# Patient Record
Sex: Female | Born: 1950
Health system: Southern US, Community
[De-identification: ages and names within clinical notes are randomized; demographics above are authoritative.]

## PROBLEM LIST (undated history)

## (undated) DIAGNOSIS — F419 Anxiety disorder, unspecified: Secondary | ICD-10-CM

## (undated) DIAGNOSIS — G4730 Sleep apnea, unspecified: Secondary | ICD-10-CM

## (undated) DIAGNOSIS — F32A Depression, unspecified: Secondary | ICD-10-CM

## (undated) DIAGNOSIS — K219 Gastro-esophageal reflux disease without esophagitis: Secondary | ICD-10-CM

## (undated) DIAGNOSIS — M797 Fibromyalgia: Secondary | ICD-10-CM

## (undated) DIAGNOSIS — M179 Osteoarthritis of knee, unspecified: Secondary | ICD-10-CM

## (undated) DIAGNOSIS — Z973 Presence of spectacles and contact lenses: Secondary | ICD-10-CM

## (undated) DIAGNOSIS — Z87440 Personal history of urinary (tract) infections: Secondary | ICD-10-CM

## (undated) DIAGNOSIS — Z87442 Personal history of urinary calculi: Secondary | ICD-10-CM

## (undated) DIAGNOSIS — R350 Frequency of micturition: Secondary | ICD-10-CM

## (undated) DIAGNOSIS — M1990 Unspecified osteoarthritis, unspecified site: Secondary | ICD-10-CM

## (undated) DIAGNOSIS — H269 Unspecified cataract: Secondary | ICD-10-CM

## (undated) DIAGNOSIS — T7840XA Allergy, unspecified, initial encounter: Secondary | ICD-10-CM

## (undated) DIAGNOSIS — R200 Anesthesia of skin: Secondary | ICD-10-CM

## (undated) DIAGNOSIS — F329 Major depressive disorder, single episode, unspecified: Secondary | ICD-10-CM

## (undated) DIAGNOSIS — D497 Neoplasm of unspecified behavior of endocrine glands and other parts of nervous system: Secondary | ICD-10-CM

## (undated) DIAGNOSIS — R7303 Prediabetes: Secondary | ICD-10-CM

## (undated) DIAGNOSIS — N200 Calculus of kidney: Secondary | ICD-10-CM

## (undated) DIAGNOSIS — I10 Essential (primary) hypertension: Secondary | ICD-10-CM

## (undated) DIAGNOSIS — M1710 Unilateral primary osteoarthritis, unspecified knee: Secondary | ICD-10-CM

## (undated) DIAGNOSIS — E785 Hyperlipidemia, unspecified: Secondary | ICD-10-CM

## (undated) HISTORY — DX: Osteoarthritis of knee, unspecified: M17.9

## (undated) HISTORY — PX: JOINT REPLACEMENT: SHX530

## (undated) HISTORY — PX: LAPAROSCOPIC ENDOMETRIOSIS FULGURATION: SUR769

## (undated) HISTORY — DX: Major depressive disorder, single episode, unspecified: F32.9

## (undated) HISTORY — DX: Depression, unspecified: F32.A

## (undated) HISTORY — PX: KNEE ARTHROSCOPY: SUR90

## (undated) HISTORY — DX: Anxiety disorder, unspecified: F41.9

## (undated) HISTORY — DX: Sleep apnea, unspecified: G47.30

## (undated) HISTORY — DX: Allergy, unspecified, initial encounter: T78.40XA

## (undated) HISTORY — DX: Unilateral primary osteoarthritis, unspecified knee: M17.10

## (undated) HISTORY — DX: Unspecified osteoarthritis, unspecified site: M19.90

## (undated) HISTORY — DX: Essential (primary) hypertension: I10

## (undated) HISTORY — DX: Unspecified cataract: H26.9

## (undated) HISTORY — PX: APPENDECTOMY: SHX54

## (undated) HISTORY — DX: Hyperlipidemia, unspecified: E78.5

## (undated) HISTORY — DX: Neoplasm of unspecified behavior of endocrine glands and other parts of nervous system: D49.7

---

## 1997-10-26 ENCOUNTER — Ambulatory Visit (HOSPITAL_COMMUNITY): Admission: RE | Admit: 1997-10-26 | Discharge: 1997-10-26 | Payer: Self-pay | Admitting: Obstetrics and Gynecology

## 1998-11-01 ENCOUNTER — Other Ambulatory Visit: Admission: RE | Admit: 1998-11-01 | Discharge: 1998-11-01 | Payer: Self-pay | Admitting: Obstetrics and Gynecology

## 1998-11-09 ENCOUNTER — Ambulatory Visit (HOSPITAL_COMMUNITY): Admission: RE | Admit: 1998-11-09 | Discharge: 1998-11-09 | Payer: Self-pay | Admitting: Obstetrics and Gynecology

## 1998-11-09 ENCOUNTER — Encounter: Payer: Self-pay | Admitting: Obstetrics and Gynecology

## 1999-11-11 ENCOUNTER — Ambulatory Visit (HOSPITAL_COMMUNITY): Admission: RE | Admit: 1999-11-11 | Discharge: 1999-11-11 | Payer: Self-pay | Admitting: Obstetrics and Gynecology

## 1999-11-11 ENCOUNTER — Encounter: Payer: Self-pay | Admitting: Obstetrics and Gynecology

## 2000-05-21 ENCOUNTER — Encounter: Payer: Self-pay | Admitting: Family Medicine

## 2000-05-21 ENCOUNTER — Encounter: Admission: RE | Admit: 2000-05-21 | Discharge: 2000-05-21 | Payer: Self-pay | Admitting: Family Medicine

## 2000-05-22 ENCOUNTER — Encounter: Admission: RE | Admit: 2000-05-22 | Discharge: 2000-05-22 | Payer: Self-pay | Admitting: Family Medicine

## 2000-05-29 ENCOUNTER — Encounter: Admission: RE | Admit: 2000-05-29 | Discharge: 2000-05-29 | Payer: Self-pay | Admitting: Family Medicine

## 2000-05-29 ENCOUNTER — Other Ambulatory Visit: Admission: RE | Admit: 2000-05-29 | Discharge: 2000-05-29 | Payer: Self-pay | Admitting: Family Medicine

## 2000-06-26 ENCOUNTER — Encounter: Admission: RE | Admit: 2000-06-26 | Discharge: 2000-06-26 | Payer: Self-pay | Admitting: Family Medicine

## 2000-07-26 ENCOUNTER — Encounter: Admission: RE | Admit: 2000-07-26 | Discharge: 2000-07-26 | Payer: Self-pay | Admitting: Family Medicine

## 2000-07-28 ENCOUNTER — Encounter: Payer: Self-pay | Admitting: Family Medicine

## 2000-07-28 ENCOUNTER — Encounter: Admission: RE | Admit: 2000-07-28 | Discharge: 2000-07-28 | Payer: Self-pay | Admitting: Family Medicine

## 2000-09-11 ENCOUNTER — Encounter: Admission: RE | Admit: 2000-09-11 | Discharge: 2000-09-11 | Payer: Self-pay | Admitting: Family Medicine

## 2000-10-25 ENCOUNTER — Encounter: Admission: RE | Admit: 2000-10-25 | Discharge: 2000-10-25 | Payer: Self-pay | Admitting: Sports Medicine

## 2000-12-20 ENCOUNTER — Encounter: Admission: RE | Admit: 2000-12-20 | Discharge: 2000-12-20 | Payer: Self-pay | Admitting: Family Medicine

## 2001-01-23 ENCOUNTER — Encounter: Payer: Self-pay | Admitting: Family Medicine

## 2001-01-23 ENCOUNTER — Ambulatory Visit (HOSPITAL_COMMUNITY): Admission: RE | Admit: 2001-01-23 | Discharge: 2001-01-23 | Payer: Self-pay | Admitting: Family Medicine

## 2001-01-31 ENCOUNTER — Encounter: Admission: RE | Admit: 2001-01-31 | Discharge: 2001-01-31 | Payer: Self-pay | Admitting: Family Medicine

## 2001-03-15 ENCOUNTER — Encounter: Admission: RE | Admit: 2001-03-15 | Discharge: 2001-03-15 | Payer: Self-pay | Admitting: Family Medicine

## 2001-04-16 ENCOUNTER — Encounter: Admission: RE | Admit: 2001-04-16 | Discharge: 2001-04-16 | Payer: Self-pay | Admitting: Family Medicine

## 2001-05-14 ENCOUNTER — Encounter: Admission: RE | Admit: 2001-05-14 | Discharge: 2001-05-14 | Payer: Self-pay | Admitting: Sports Medicine

## 2001-05-29 ENCOUNTER — Encounter: Admission: RE | Admit: 2001-05-29 | Discharge: 2001-05-29 | Payer: Self-pay | Admitting: Family Medicine

## 2001-06-05 ENCOUNTER — Encounter: Admission: RE | Admit: 2001-06-05 | Discharge: 2001-06-05 | Payer: Self-pay | Admitting: Family Medicine

## 2001-06-27 ENCOUNTER — Encounter: Admission: RE | Admit: 2001-06-27 | Discharge: 2001-06-27 | Payer: Self-pay | Admitting: Family Medicine

## 2001-07-10 ENCOUNTER — Encounter: Admission: RE | Admit: 2001-07-10 | Discharge: 2001-07-10 | Payer: Self-pay | Admitting: Family Medicine

## 2001-07-29 ENCOUNTER — Encounter: Admission: RE | Admit: 2001-07-29 | Discharge: 2001-07-29 | Payer: Self-pay | Admitting: Family Medicine

## 2001-08-23 ENCOUNTER — Encounter: Admission: RE | Admit: 2001-08-23 | Discharge: 2001-08-23 | Payer: Self-pay | Admitting: Family Medicine

## 2001-10-16 ENCOUNTER — Encounter: Admission: RE | Admit: 2001-10-16 | Discharge: 2001-10-16 | Payer: Self-pay | Admitting: Family Medicine

## 2001-10-31 ENCOUNTER — Encounter: Admission: RE | Admit: 2001-10-31 | Discharge: 2001-10-31 | Payer: Self-pay | Admitting: Family Medicine

## 2002-01-09 ENCOUNTER — Encounter: Admission: RE | Admit: 2002-01-09 | Discharge: 2002-01-09 | Payer: Self-pay | Admitting: Family Medicine

## 2002-01-28 ENCOUNTER — Encounter: Payer: Self-pay | Admitting: Family Medicine

## 2002-01-28 ENCOUNTER — Ambulatory Visit (HOSPITAL_COMMUNITY): Admission: RE | Admit: 2002-01-28 | Discharge: 2002-01-28 | Payer: Self-pay | Admitting: Family Medicine

## 2002-01-30 ENCOUNTER — Encounter: Payer: Self-pay | Admitting: Family Medicine

## 2002-01-30 ENCOUNTER — Encounter: Admission: RE | Admit: 2002-01-30 | Discharge: 2002-01-30 | Payer: Self-pay | Admitting: Family Medicine

## 2002-04-24 ENCOUNTER — Encounter: Admission: RE | Admit: 2002-04-24 | Discharge: 2002-04-24 | Payer: Self-pay | Admitting: Family Medicine

## 2002-07-18 ENCOUNTER — Encounter: Admission: RE | Admit: 2002-07-18 | Discharge: 2002-07-18 | Payer: Self-pay | Admitting: Family Medicine

## 2002-07-24 ENCOUNTER — Encounter: Admission: RE | Admit: 2002-07-24 | Discharge: 2002-07-24 | Payer: Self-pay | Admitting: Family Medicine

## 2002-09-04 ENCOUNTER — Encounter: Admission: RE | Admit: 2002-09-04 | Discharge: 2002-09-04 | Payer: Self-pay | Admitting: Family Medicine

## 2002-10-17 ENCOUNTER — Ambulatory Visit (HOSPITAL_COMMUNITY): Admission: RE | Admit: 2002-10-17 | Discharge: 2002-10-17 | Payer: Self-pay | Admitting: Family Medicine

## 2002-10-17 ENCOUNTER — Encounter: Admission: RE | Admit: 2002-10-17 | Discharge: 2002-10-17 | Payer: Self-pay | Admitting: Family Medicine

## 2002-11-13 ENCOUNTER — Encounter: Admission: RE | Admit: 2002-11-13 | Discharge: 2002-11-13 | Payer: Self-pay | Admitting: Family Medicine

## 2002-11-18 ENCOUNTER — Encounter: Admission: RE | Admit: 2002-11-18 | Discharge: 2002-11-18 | Payer: Self-pay | Admitting: Family Medicine

## 2002-12-05 ENCOUNTER — Encounter: Admission: RE | Admit: 2002-12-05 | Discharge: 2002-12-05 | Payer: Self-pay | Admitting: Family Medicine

## 2003-04-01 ENCOUNTER — Ambulatory Visit (HOSPITAL_COMMUNITY): Admission: RE | Admit: 2003-04-01 | Discharge: 2003-04-01 | Payer: Self-pay

## 2003-04-03 ENCOUNTER — Encounter: Admission: RE | Admit: 2003-04-03 | Discharge: 2003-04-03 | Payer: Self-pay | Admitting: Sports Medicine

## 2003-04-03 ENCOUNTER — Encounter: Payer: Self-pay | Admitting: Sports Medicine

## 2003-04-08 ENCOUNTER — Encounter: Admission: RE | Admit: 2003-04-08 | Discharge: 2003-04-08 | Payer: Self-pay | Admitting: Family Medicine

## 2003-05-06 ENCOUNTER — Encounter: Admission: RE | Admit: 2003-05-06 | Discharge: 2003-07-15 | Payer: Self-pay | Admitting: Family Medicine

## 2003-05-28 ENCOUNTER — Encounter: Admission: RE | Admit: 2003-05-28 | Discharge: 2003-05-28 | Payer: Self-pay | Admitting: Sports Medicine

## 2003-07-08 ENCOUNTER — Encounter: Admission: RE | Admit: 2003-07-08 | Discharge: 2003-07-08 | Payer: Self-pay | Admitting: Family Medicine

## 2003-09-29 ENCOUNTER — Encounter: Admission: RE | Admit: 2003-09-29 | Discharge: 2003-09-29 | Payer: Self-pay | Admitting: Sports Medicine

## 2004-01-01 ENCOUNTER — Encounter: Admission: RE | Admit: 2004-01-01 | Discharge: 2004-01-01 | Payer: Self-pay | Admitting: Sports Medicine

## 2004-04-05 ENCOUNTER — Encounter: Admission: RE | Admit: 2004-04-05 | Discharge: 2004-04-05 | Payer: Self-pay | Admitting: Sports Medicine

## 2004-04-11 ENCOUNTER — Encounter: Admission: RE | Admit: 2004-04-11 | Discharge: 2004-04-11 | Payer: Self-pay | Admitting: Sports Medicine

## 2004-05-13 ENCOUNTER — Ambulatory Visit: Payer: Self-pay | Admitting: Family Medicine

## 2004-08-12 ENCOUNTER — Ambulatory Visit: Payer: Self-pay | Admitting: Family Medicine

## 2004-09-12 ENCOUNTER — Ambulatory Visit: Payer: Self-pay | Admitting: Family Medicine

## 2004-10-11 ENCOUNTER — Encounter: Admission: RE | Admit: 2004-10-11 | Discharge: 2004-10-11 | Payer: Self-pay | Admitting: Sports Medicine

## 2004-10-11 ENCOUNTER — Ambulatory Visit: Payer: Self-pay | Admitting: Family Medicine

## 2005-04-06 ENCOUNTER — Encounter: Admission: RE | Admit: 2005-04-06 | Discharge: 2005-04-06 | Payer: Self-pay | Admitting: Sports Medicine

## 2005-04-14 ENCOUNTER — Ambulatory Visit: Payer: Self-pay | Admitting: Family Medicine

## 2005-04-20 ENCOUNTER — Ambulatory Visit: Payer: Self-pay | Admitting: Family Medicine

## 2005-05-10 ENCOUNTER — Ambulatory Visit: Payer: Self-pay | Admitting: Family Medicine

## 2005-06-29 ENCOUNTER — Ambulatory Visit: Payer: Self-pay | Admitting: Family Medicine

## 2005-07-25 ENCOUNTER — Ambulatory Visit: Payer: Self-pay | Admitting: Family Medicine

## 2005-09-27 ENCOUNTER — Ambulatory Visit: Payer: Self-pay | Admitting: Family Medicine

## 2006-02-23 ENCOUNTER — Ambulatory Visit: Payer: Self-pay | Admitting: Family Medicine

## 2006-03-14 ENCOUNTER — Encounter (INDEPENDENT_AMBULATORY_CARE_PROVIDER_SITE_OTHER): Payer: Self-pay | Admitting: *Deleted

## 2006-03-16 ENCOUNTER — Ambulatory Visit: Payer: Self-pay | Admitting: Family Medicine

## 2006-03-23 ENCOUNTER — Ambulatory Visit: Payer: Self-pay | Admitting: Family Medicine

## 2006-04-23 ENCOUNTER — Encounter: Admission: RE | Admit: 2006-04-23 | Discharge: 2006-04-23 | Payer: Self-pay | Admitting: Sports Medicine

## 2006-08-01 ENCOUNTER — Ambulatory Visit: Payer: Self-pay | Admitting: Family Medicine

## 2006-08-16 ENCOUNTER — Ambulatory Visit: Payer: Self-pay | Admitting: Family Medicine

## 2006-10-11 DIAGNOSIS — K219 Gastro-esophageal reflux disease without esophagitis: Secondary | ICD-10-CM | POA: Insufficient documentation

## 2006-10-11 DIAGNOSIS — F339 Major depressive disorder, recurrent, unspecified: Secondary | ICD-10-CM | POA: Insufficient documentation

## 2006-10-11 DIAGNOSIS — N951 Menopausal and female climacteric states: Secondary | ICD-10-CM | POA: Insufficient documentation

## 2006-10-11 DIAGNOSIS — E7800 Pure hypercholesterolemia, unspecified: Secondary | ICD-10-CM | POA: Insufficient documentation

## 2006-10-11 DIAGNOSIS — IMO0001 Reserved for inherently not codable concepts without codable children: Secondary | ICD-10-CM | POA: Insufficient documentation

## 2006-10-11 DIAGNOSIS — I10 Essential (primary) hypertension: Secondary | ICD-10-CM | POA: Insufficient documentation

## 2006-10-12 ENCOUNTER — Encounter (INDEPENDENT_AMBULATORY_CARE_PROVIDER_SITE_OTHER): Payer: Self-pay | Admitting: *Deleted

## 2006-11-27 ENCOUNTER — Encounter (INDEPENDENT_AMBULATORY_CARE_PROVIDER_SITE_OTHER): Payer: Self-pay | Admitting: Family Medicine

## 2006-11-27 ENCOUNTER — Ambulatory Visit: Payer: Self-pay | Admitting: Family Medicine

## 2006-11-27 LAB — CONVERTED CEMR LAB
BUN: 23 mg/dL (ref 6–23)
Calcium: 10.5 mg/dL (ref 8.4–10.5)
Chloride: 104 meq/L (ref 96–112)
Creatinine, Ser: 0.75 mg/dL (ref 0.40–1.20)
Progesterone: 0.4 ng/mL

## 2006-11-28 ENCOUNTER — Telehealth (INDEPENDENT_AMBULATORY_CARE_PROVIDER_SITE_OTHER): Payer: Self-pay | Admitting: Family Medicine

## 2006-12-13 ENCOUNTER — Telehealth (INDEPENDENT_AMBULATORY_CARE_PROVIDER_SITE_OTHER): Payer: Self-pay | Admitting: *Deleted

## 2006-12-18 ENCOUNTER — Ambulatory Visit (HOSPITAL_COMMUNITY): Admission: RE | Admit: 2006-12-18 | Discharge: 2006-12-18 | Payer: Self-pay | Admitting: Family Medicine

## 2006-12-19 ENCOUNTER — Encounter: Admission: RE | Admit: 2006-12-19 | Discharge: 2006-12-19 | Payer: Self-pay | Admitting: Family Medicine

## 2007-01-18 ENCOUNTER — Encounter (INDEPENDENT_AMBULATORY_CARE_PROVIDER_SITE_OTHER): Payer: Self-pay | Admitting: *Deleted

## 2007-02-01 ENCOUNTER — Ambulatory Visit: Payer: Self-pay | Admitting: Family Medicine

## 2007-03-28 ENCOUNTER — Encounter (INDEPENDENT_AMBULATORY_CARE_PROVIDER_SITE_OTHER): Payer: Self-pay | Admitting: Family Medicine

## 2007-04-02 ENCOUNTER — Encounter (INDEPENDENT_AMBULATORY_CARE_PROVIDER_SITE_OTHER): Payer: Self-pay | Admitting: Family Medicine

## 2007-04-04 ENCOUNTER — Telehealth (INDEPENDENT_AMBULATORY_CARE_PROVIDER_SITE_OTHER): Payer: Self-pay | Admitting: Family Medicine

## 2007-04-18 ENCOUNTER — Encounter (INDEPENDENT_AMBULATORY_CARE_PROVIDER_SITE_OTHER): Payer: Self-pay | Admitting: Family Medicine

## 2007-04-18 ENCOUNTER — Ambulatory Visit: Payer: Self-pay | Admitting: Sports Medicine

## 2007-04-25 ENCOUNTER — Encounter (INDEPENDENT_AMBULATORY_CARE_PROVIDER_SITE_OTHER): Payer: Self-pay | Admitting: Family Medicine

## 2007-04-25 ENCOUNTER — Encounter: Admission: RE | Admit: 2007-04-25 | Discharge: 2007-04-25 | Payer: Self-pay | Admitting: Family Medicine

## 2007-04-25 LAB — CONVERTED CEMR LAB
ALT: 15 units/L (ref 0–35)
AST: 18 units/L (ref 0–37)
Chloride: 102 meq/L (ref 96–112)
Creatinine, Ser: 0.84 mg/dL (ref 0.40–1.20)
HCT: 43.3 % (ref 36.0–46.0)
MCV: 92.9 fL (ref 78.0–100.0)
Platelets: 287 10*3/uL (ref 150–400)
RDW: 13.4 % (ref 11.5–14.0)
Total Bilirubin: 0.7 mg/dL (ref 0.3–1.2)
Total CHOL/HDL Ratio: 3.6
VLDL: 31 mg/dL (ref 0–40)

## 2007-05-28 ENCOUNTER — Ambulatory Visit: Payer: Self-pay | Admitting: Family Medicine

## 2007-06-09 ENCOUNTER — Encounter: Admission: RE | Admit: 2007-06-09 | Discharge: 2007-06-09 | Payer: Self-pay

## 2007-07-04 ENCOUNTER — Ambulatory Visit: Payer: Self-pay | Admitting: Family Medicine

## 2007-07-04 DIAGNOSIS — M159 Polyosteoarthritis, unspecified: Secondary | ICD-10-CM | POA: Insufficient documentation

## 2007-07-04 DIAGNOSIS — G5600 Carpal tunnel syndrome, unspecified upper limb: Secondary | ICD-10-CM | POA: Insufficient documentation

## 2007-08-01 ENCOUNTER — Encounter: Admission: RE | Admit: 2007-08-01 | Discharge: 2007-08-01 | Payer: Self-pay

## 2007-08-12 ENCOUNTER — Ambulatory Visit: Payer: Self-pay | Admitting: Family Medicine

## 2007-08-12 ENCOUNTER — Encounter (INDEPENDENT_AMBULATORY_CARE_PROVIDER_SITE_OTHER): Payer: Self-pay | Admitting: Family Medicine

## 2007-08-12 DIAGNOSIS — G2581 Restless legs syndrome: Secondary | ICD-10-CM | POA: Insufficient documentation

## 2007-08-29 ENCOUNTER — Encounter (INDEPENDENT_AMBULATORY_CARE_PROVIDER_SITE_OTHER): Payer: Self-pay | Admitting: Family Medicine

## 2007-09-02 ENCOUNTER — Encounter: Admission: RE | Admit: 2007-09-02 | Discharge: 2007-09-02 | Payer: Self-pay | Admitting: General Surgery

## 2007-09-20 ENCOUNTER — Encounter (INDEPENDENT_AMBULATORY_CARE_PROVIDER_SITE_OTHER): Payer: Self-pay | Admitting: Family Medicine

## 2007-10-15 ENCOUNTER — Telehealth (INDEPENDENT_AMBULATORY_CARE_PROVIDER_SITE_OTHER): Payer: Self-pay | Admitting: Family Medicine

## 2007-11-07 ENCOUNTER — Telehealth: Payer: Self-pay | Admitting: *Deleted

## 2007-12-05 ENCOUNTER — Ambulatory Visit: Payer: Self-pay | Admitting: Family Medicine

## 2007-12-05 ENCOUNTER — Telehealth: Payer: Self-pay | Admitting: *Deleted

## 2007-12-05 LAB — CONVERTED CEMR LAB: Rapid Strep: NEGATIVE

## 2007-12-06 ENCOUNTER — Emergency Department (HOSPITAL_COMMUNITY): Admission: EM | Admit: 2007-12-06 | Discharge: 2007-12-06 | Payer: Self-pay | Admitting: Emergency Medicine

## 2007-12-13 ENCOUNTER — Ambulatory Visit: Payer: Self-pay | Admitting: Family Medicine

## 2007-12-13 ENCOUNTER — Encounter (INDEPENDENT_AMBULATORY_CARE_PROVIDER_SITE_OTHER): Payer: Self-pay | Admitting: Family Medicine

## 2007-12-13 DIAGNOSIS — D351 Benign neoplasm of parathyroid gland: Secondary | ICD-10-CM | POA: Insufficient documentation

## 2007-12-17 LAB — CONVERTED CEMR LAB
ALT: 27 units/L (ref 0–35)
Alkaline Phosphatase: 115 units/L (ref 39–117)
Sodium: 138 meq/L (ref 135–145)
Total Bilirubin: 0.4 mg/dL (ref 0.3–1.2)
Total Protein: 7.1 g/dL (ref 6.0–8.3)

## 2007-12-23 ENCOUNTER — Encounter (INDEPENDENT_AMBULATORY_CARE_PROVIDER_SITE_OTHER): Payer: Self-pay | Admitting: Family Medicine

## 2008-04-27 ENCOUNTER — Encounter: Admission: RE | Admit: 2008-04-27 | Discharge: 2008-04-27 | Payer: Self-pay | Admitting: Endocrinology

## 2008-04-30 ENCOUNTER — Encounter (INDEPENDENT_AMBULATORY_CARE_PROVIDER_SITE_OTHER): Payer: Self-pay | Admitting: Family Medicine

## 2009-05-27 ENCOUNTER — Encounter: Admission: RE | Admit: 2009-05-27 | Discharge: 2009-05-27 | Payer: Self-pay | Admitting: Internal Medicine

## 2010-06-03 ENCOUNTER — Encounter: Admission: RE | Admit: 2010-06-03 | Discharge: 2010-06-03 | Payer: Self-pay | Admitting: Internal Medicine

## 2011-04-24 ENCOUNTER — Encounter: Payer: Self-pay | Admitting: Internal Medicine

## 2011-06-29 ENCOUNTER — Other Ambulatory Visit: Payer: Self-pay | Admitting: Pulmonary Disease

## 2011-06-29 ENCOUNTER — Other Ambulatory Visit: Payer: Self-pay | Admitting: Internal Medicine

## 2011-06-29 DIAGNOSIS — Z1231 Encounter for screening mammogram for malignant neoplasm of breast: Secondary | ICD-10-CM

## 2011-07-26 ENCOUNTER — Ambulatory Visit
Admission: RE | Admit: 2011-07-26 | Discharge: 2011-07-26 | Disposition: A | Discharge status: Home / Self Care | Payer: Medicare Other | Source: Ambulatory Visit | Attending: Internal Medicine | Admitting: Internal Medicine

## 2011-07-26 DIAGNOSIS — Z1231 Encounter for screening mammogram for malignant neoplasm of breast: Secondary | ICD-10-CM

## 2011-07-28 ENCOUNTER — Encounter: Payer: Self-pay | Admitting: Internal Medicine

## 2011-08-16 DIAGNOSIS — Z01419 Encounter for gynecological examination (general) (routine) without abnormal findings: Secondary | ICD-10-CM | POA: Diagnosis not present

## 2011-08-16 DIAGNOSIS — Z0000 Encounter for general adult medical examination without abnormal findings: Secondary | ICD-10-CM | POA: Diagnosis not present

## 2011-08-16 DIAGNOSIS — Z124 Encounter for screening for malignant neoplasm of cervix: Secondary | ICD-10-CM | POA: Diagnosis not present

## 2011-08-18 ENCOUNTER — Ambulatory Visit (AMBULATORY_SURGERY_CENTER): Payer: Medicare Other | Admitting: *Deleted

## 2011-08-18 VITALS — Ht 68.0 in | Wt 182.6 lb

## 2011-08-18 DIAGNOSIS — Z1211 Encounter for screening for malignant neoplasm of colon: Secondary | ICD-10-CM

## 2011-08-18 MED ORDER — PEG-KCL-NACL-NASULF-NA ASC-C 100 G PO SOLR: ORAL | Status: DC

## 2011-09-01 ENCOUNTER — Ambulatory Visit (AMBULATORY_SURGERY_CENTER): Payer: Medicare Other | Admitting: Internal Medicine

## 2011-09-01 ENCOUNTER — Encounter: Payer: Self-pay | Admitting: Internal Medicine

## 2011-09-01 VITALS — BP 135/93 | HR 79 | Temp 97.1°F | Resp 22 | Ht 68.0 in | Wt 182.0 lb

## 2011-09-01 DIAGNOSIS — D128 Benign neoplasm of rectum: Secondary | ICD-10-CM

## 2011-09-01 DIAGNOSIS — Z1211 Encounter for screening for malignant neoplasm of colon: Secondary | ICD-10-CM

## 2011-09-01 DIAGNOSIS — D126 Benign neoplasm of colon, unspecified: Secondary | ICD-10-CM | POA: Diagnosis not present

## 2011-09-01 DIAGNOSIS — D129 Benign neoplasm of anus and anal canal: Secondary | ICD-10-CM | POA: Diagnosis not present

## 2011-09-01 MED ORDER — SODIUM CHLORIDE 0.9 % IV SOLN: 500.0000 mL | INTRAVENOUS | Status: DC

## 2011-09-01 NOTE — Patient Instructions (Signed)
FOLLOW INSTRUCTIONS ON THE GREEN AND BLUE INSTRUCTION SHEETS.  CONTINUE YOUR MEDICATIONS.  HIGH FIBER DIET.  AWAIT PATHOLOGY RESULTS. 

## 2011-09-01 NOTE — Progress Notes (Signed)
Pressure applied to the abdomen to reach cecum,

## 2011-09-01 NOTE — Op Note (Signed)
 [facility] [address] [place], [place]  [address]  COLONOSCOPY PROCEDURE REPORT  PATIENT:  Alexandra Gibson, Alexandra Gibson  MR#:  [id number] BIRTHDATE:  03-Jan-1951, 60 yrs. old  GENDER:  female ENDOSCOPIST:  Hedwig Morton. Juanda Chance, MD REF. BY:  Thayer Headings, M.D. PROCEDURE DATE:  09/01/2011 PROCEDURE:  Colonoscopy with biopsy and snare polypectomy ASA CLASS:  Class I INDICATIONS:  Routine Risk Screening last colon 2002 was normal MEDICATIONS:   These medications were titrated to patient response per physician's verbal order, Versed 18 mg, Fentanyl 175 mcg  DESCRIPTION OF PROCEDURE:   After the risks and benefits and of the procedure were explained, informed consent was obtained. Digital rectal exam was performed and revealed no rectal masses. The LB PCF-[id number] endoscope was introduced through the anus and advanced to the cecum, which was identified by both the appendix and ileocecal valve.  The quality of the prep was good, using MoviPrep.  The instrument was then slowly withdrawn as the colon was fully examined. <<PROCEDUREIMAGES>>  FINDINGS:  Three polyps were found. 3-5 mm sessile polyp at 5-10 cm, 15 mm sessile polyp at 20cm The polyps were removed using cold biopsy forceps. Polyp was snared, then cauterized with monopolar cautery. Retrieval was successful (see image5, image6, and image8). snare polyp  Mild diverticulosis was found in the sigmoid colon (see image1).  This was otherwise a normal examination of the colon (see image2, image3, image4, and image7).   Retroflexed views in the rectum revealed no abnormalities.    The scope was then withdrawn from the patient and the procedure completed.  COMPLICATIONS:  None ENDOSCOPIC IMPRESSION: 1) Three polyps 2) Mild diverticulosis in the sigmoid colon 3) Otherwise normal examination RECOMMENDATIONS: 1) Await pathology results 2) High fiber diet.  REPEAT EXAM:  In 5 year(s) for.  ______________________________ Hedwig Morton.  Juanda Chance, MD  CC:  n. eSIGNED:   Hedwig Morton. Brodie at 09/01/2011 02:43 PM  Alexandra Gibson, [id number]

## 2011-09-01 NOTE — Progress Notes (Signed)
Patient did not experience any of the following events: a burn prior to discharge; a fall within the facility; wrong site/side/patient/procedure/implant event; or a hospital transfer or hospital admission upon discharge from the facility. (G8907) Patient did not have preoperative order for IV antibiotic SSI prophylaxis. (G8918)  

## 2011-09-04 ENCOUNTER — Telehealth: Payer: Self-pay | Admitting: *Deleted

## 2011-09-04 NOTE — Telephone Encounter (Signed)

## 2011-09-05 ENCOUNTER — Encounter: Payer: Self-pay | Admitting: *Deleted

## 2011-09-05 ENCOUNTER — Encounter: Payer: Self-pay | Admitting: Internal Medicine

## 2011-09-07 ENCOUNTER — Encounter: Payer: Self-pay | Admitting: *Deleted

## 2011-11-20 DIAGNOSIS — J019 Acute sinusitis, unspecified: Secondary | ICD-10-CM | POA: Diagnosis not present

## 2011-11-20 DIAGNOSIS — M1710 Unilateral primary osteoarthritis, unspecified knee: Secondary | ICD-10-CM | POA: Diagnosis not present

## 2011-11-20 DIAGNOSIS — IMO0002 Reserved for concepts with insufficient information to code with codable children: Secondary | ICD-10-CM | POA: Diagnosis not present

## 2011-11-20 DIAGNOSIS — R059 Cough, unspecified: Secondary | ICD-10-CM | POA: Diagnosis not present

## 2011-11-20 DIAGNOSIS — R05 Cough: Secondary | ICD-10-CM | POA: Diagnosis not present

## 2012-01-12 DIAGNOSIS — IMO0002 Reserved for concepts with insufficient information to code with codable children: Secondary | ICD-10-CM | POA: Diagnosis not present

## 2012-01-12 DIAGNOSIS — M1710 Unilateral primary osteoarthritis, unspecified knee: Secondary | ICD-10-CM | POA: Diagnosis not present

## 2012-01-24 DIAGNOSIS — E559 Vitamin D deficiency, unspecified: Secondary | ICD-10-CM | POA: Diagnosis not present

## 2012-01-24 DIAGNOSIS — I10 Essential (primary) hypertension: Secondary | ICD-10-CM | POA: Diagnosis not present

## 2012-01-31 DIAGNOSIS — K219 Gastro-esophageal reflux disease without esophagitis: Secondary | ICD-10-CM | POA: Diagnosis not present

## 2012-01-31 DIAGNOSIS — E785 Hyperlipidemia, unspecified: Secondary | ICD-10-CM | POA: Diagnosis not present

## 2012-01-31 DIAGNOSIS — J309 Allergic rhinitis, unspecified: Secondary | ICD-10-CM | POA: Diagnosis not present

## 2012-01-31 DIAGNOSIS — I10 Essential (primary) hypertension: Secondary | ICD-10-CM | POA: Diagnosis not present

## 2012-02-22 DIAGNOSIS — M1710 Unilateral primary osteoarthritis, unspecified knee: Secondary | ICD-10-CM | POA: Diagnosis not present

## 2012-02-22 DIAGNOSIS — IMO0002 Reserved for concepts with insufficient information to code with codable children: Secondary | ICD-10-CM | POA: Diagnosis not present

## 2012-02-29 DIAGNOSIS — IMO0002 Reserved for concepts with insufficient information to code with codable children: Secondary | ICD-10-CM | POA: Diagnosis not present

## 2012-02-29 DIAGNOSIS — M1710 Unilateral primary osteoarthritis, unspecified knee: Secondary | ICD-10-CM | POA: Diagnosis not present

## 2012-03-07 DIAGNOSIS — IMO0002 Reserved for concepts with insufficient information to code with codable children: Secondary | ICD-10-CM | POA: Diagnosis not present

## 2012-03-07 DIAGNOSIS — M1710 Unilateral primary osteoarthritis, unspecified knee: Secondary | ICD-10-CM | POA: Diagnosis not present

## 2012-07-25 ENCOUNTER — Other Ambulatory Visit: Payer: Self-pay | Admitting: Internal Medicine

## 2012-07-25 DIAGNOSIS — E785 Hyperlipidemia, unspecified: Secondary | ICD-10-CM | POA: Diagnosis not present

## 2012-07-25 DIAGNOSIS — E559 Vitamin D deficiency, unspecified: Secondary | ICD-10-CM | POA: Diagnosis not present

## 2012-07-25 DIAGNOSIS — I10 Essential (primary) hypertension: Secondary | ICD-10-CM | POA: Diagnosis not present

## 2012-07-25 DIAGNOSIS — Z1331 Encounter for screening for depression: Secondary | ICD-10-CM | POA: Diagnosis not present

## 2012-07-25 DIAGNOSIS — Z0000 Encounter for general adult medical examination without abnormal findings: Secondary | ICD-10-CM | POA: Diagnosis not present

## 2012-07-25 DIAGNOSIS — E663 Overweight: Secondary | ICD-10-CM | POA: Diagnosis not present

## 2012-07-25 DIAGNOSIS — Z1231 Encounter for screening mammogram for malignant neoplasm of breast: Secondary | ICD-10-CM

## 2012-08-01 DIAGNOSIS — H43819 Vitreous degeneration, unspecified eye: Secondary | ICD-10-CM | POA: Diagnosis not present

## 2012-08-01 DIAGNOSIS — E559 Vitamin D deficiency, unspecified: Secondary | ICD-10-CM | POA: Diagnosis not present

## 2012-08-01 DIAGNOSIS — Z23 Encounter for immunization: Secondary | ICD-10-CM | POA: Diagnosis not present

## 2012-08-01 DIAGNOSIS — E785 Hyperlipidemia, unspecified: Secondary | ICD-10-CM | POA: Diagnosis not present

## 2012-08-01 DIAGNOSIS — I10 Essential (primary) hypertension: Secondary | ICD-10-CM | POA: Diagnosis not present

## 2012-08-01 DIAGNOSIS — F411 Generalized anxiety disorder: Secondary | ICD-10-CM | POA: Diagnosis not present

## 2012-08-09 DIAGNOSIS — H52209 Unspecified astigmatism, unspecified eye: Secondary | ICD-10-CM | POA: Diagnosis not present

## 2012-08-09 DIAGNOSIS — H5200 Hypermetropia, unspecified eye: Secondary | ICD-10-CM | POA: Diagnosis not present

## 2012-08-09 DIAGNOSIS — H524 Presbyopia: Secondary | ICD-10-CM | POA: Diagnosis not present

## 2012-08-09 DIAGNOSIS — H43819 Vitreous degeneration, unspecified eye: Secondary | ICD-10-CM | POA: Diagnosis not present

## 2012-09-03 ENCOUNTER — Ambulatory Visit
Admission: RE | Admit: 2012-09-03 | Discharge: 2012-09-03 | Disposition: A | Discharge status: Home / Self Care | Payer: Medicare Other | Source: Ambulatory Visit | Attending: Internal Medicine | Admitting: Internal Medicine

## 2012-09-03 DIAGNOSIS — Z1231 Encounter for screening mammogram for malignant neoplasm of breast: Secondary | ICD-10-CM

## 2012-09-06 DIAGNOSIS — Z01419 Encounter for gynecological examination (general) (routine) without abnormal findings: Secondary | ICD-10-CM | POA: Diagnosis not present

## 2013-01-30 DIAGNOSIS — E210 Primary hyperparathyroidism: Secondary | ICD-10-CM | POA: Diagnosis not present

## 2013-01-30 DIAGNOSIS — E785 Hyperlipidemia, unspecified: Secondary | ICD-10-CM | POA: Diagnosis not present

## 2013-01-30 DIAGNOSIS — I10 Essential (primary) hypertension: Secondary | ICD-10-CM | POA: Diagnosis not present

## 2013-01-30 DIAGNOSIS — E559 Vitamin D deficiency, unspecified: Secondary | ICD-10-CM | POA: Diagnosis not present

## 2013-02-11 DIAGNOSIS — K219 Gastro-esophageal reflux disease without esophagitis: Secondary | ICD-10-CM | POA: Diagnosis not present

## 2013-02-11 DIAGNOSIS — E210 Primary hyperparathyroidism: Secondary | ICD-10-CM | POA: Diagnosis not present

## 2013-02-11 DIAGNOSIS — E785 Hyperlipidemia, unspecified: Secondary | ICD-10-CM | POA: Diagnosis not present

## 2013-02-11 DIAGNOSIS — I10 Essential (primary) hypertension: Secondary | ICD-10-CM | POA: Diagnosis not present

## 2013-08-20 ENCOUNTER — Other Ambulatory Visit: Payer: Self-pay

## 2013-08-20 DIAGNOSIS — Z1231 Encounter for screening mammogram for malignant neoplasm of breast: Secondary | ICD-10-CM

## 2013-08-26 DIAGNOSIS — E785 Hyperlipidemia, unspecified: Secondary | ICD-10-CM | POA: Diagnosis not present

## 2013-08-26 DIAGNOSIS — E559 Vitamin D deficiency, unspecified: Secondary | ICD-10-CM | POA: Diagnosis not present

## 2013-08-26 DIAGNOSIS — K219 Gastro-esophageal reflux disease without esophagitis: Secondary | ICD-10-CM | POA: Diagnosis not present

## 2013-08-26 DIAGNOSIS — E210 Primary hyperparathyroidism: Secondary | ICD-10-CM | POA: Diagnosis not present

## 2013-08-26 DIAGNOSIS — I10 Essential (primary) hypertension: Secondary | ICD-10-CM | POA: Diagnosis not present

## 2013-09-11 ENCOUNTER — Ambulatory Visit: Payer: Medicare Other

## 2013-10-24 DIAGNOSIS — H25019 Cortical age-related cataract, unspecified eye: Secondary | ICD-10-CM | POA: Diagnosis not present

## 2013-10-24 DIAGNOSIS — H52209 Unspecified astigmatism, unspecified eye: Secondary | ICD-10-CM | POA: Diagnosis not present

## 2013-11-11 DIAGNOSIS — M1710 Unilateral primary osteoarthritis, unspecified knee: Secondary | ICD-10-CM | POA: Diagnosis not present

## 2013-11-24 DIAGNOSIS — I10 Essential (primary) hypertension: Secondary | ICD-10-CM | POA: Diagnosis not present

## 2013-11-24 DIAGNOSIS — E210 Primary hyperparathyroidism: Secondary | ICD-10-CM | POA: Diagnosis not present

## 2013-11-24 DIAGNOSIS — E785 Hyperlipidemia, unspecified: Secondary | ICD-10-CM | POA: Diagnosis not present

## 2013-11-24 DIAGNOSIS — IMO0002 Reserved for concepts with insufficient information to code with codable children: Secondary | ICD-10-CM | POA: Diagnosis not present

## 2013-11-24 DIAGNOSIS — M1710 Unilateral primary osteoarthritis, unspecified knee: Secondary | ICD-10-CM | POA: Diagnosis not present

## 2013-12-16 DIAGNOSIS — M1710 Unilateral primary osteoarthritis, unspecified knee: Secondary | ICD-10-CM | POA: Diagnosis not present

## 2013-12-23 DIAGNOSIS — M1710 Unilateral primary osteoarthritis, unspecified knee: Secondary | ICD-10-CM | POA: Diagnosis not present

## 2013-12-31 DIAGNOSIS — M1710 Unilateral primary osteoarthritis, unspecified knee: Secondary | ICD-10-CM | POA: Diagnosis not present

## 2014-02-11 DIAGNOSIS — M1710 Unilateral primary osteoarthritis, unspecified knee: Secondary | ICD-10-CM | POA: Diagnosis not present

## 2014-04-07 DIAGNOSIS — J309 Allergic rhinitis, unspecified: Secondary | ICD-10-CM | POA: Diagnosis not present

## 2014-04-07 DIAGNOSIS — M1710 Unilateral primary osteoarthritis, unspecified knee: Secondary | ICD-10-CM | POA: Diagnosis not present

## 2014-04-07 DIAGNOSIS — IMO0002 Reserved for concepts with insufficient information to code with codable children: Secondary | ICD-10-CM | POA: Diagnosis not present

## 2014-04-07 DIAGNOSIS — Z01818 Encounter for other preprocedural examination: Secondary | ICD-10-CM | POA: Diagnosis not present

## 2014-04-07 DIAGNOSIS — E785 Hyperlipidemia, unspecified: Secondary | ICD-10-CM | POA: Diagnosis not present

## 2014-04-28 ENCOUNTER — Encounter: Payer: Self-pay | Admitting: Internal Medicine

## 2014-06-09 ENCOUNTER — Ambulatory Visit: Payer: Self-pay | Admitting: Orthopedic Surgery

## 2014-06-09 NOTE — Progress Notes (Signed)
Preoperative surgical orders have been place into the Epic hospital system for [facility] on 06/09/2014, 5:40 PM  by Mickel Crow for surgery on 06-29-2014.  Preop Total Knee orders including Experal, IV Tylenol, and IV Decadron as long as there are no contraindications to the above medications. Arlee Muslim, PA-C

## 2014-06-22 ENCOUNTER — Encounter (HOSPITAL_COMMUNITY): Payer: Self-pay

## 2014-06-22 ENCOUNTER — Ambulatory Visit (HOSPITAL_COMMUNITY)
Admission: RE | Admit: 2014-06-22 | Discharge: 2014-06-22 | Disposition: A | Discharge status: Home / Self Care | Payer: Federal, State, Local not specified - PPO | Source: Ambulatory Visit | Attending: Anesthesiology | Admitting: Anesthesiology

## 2014-06-22 ENCOUNTER — Encounter (HOSPITAL_COMMUNITY)
Admission: RE | Admit: 2014-06-22 | Discharge: 2014-06-22 | Disposition: A | Discharge status: Home / Self Care | Payer: Federal, State, Local not specified - PPO | Source: Ambulatory Visit | Attending: Orthopedic Surgery | Admitting: Orthopedic Surgery

## 2014-06-22 DIAGNOSIS — Z01810 Encounter for preprocedural cardiovascular examination: Secondary | ICD-10-CM | POA: Insufficient documentation

## 2014-06-22 DIAGNOSIS — I10 Essential (primary) hypertension: Secondary | ICD-10-CM | POA: Insufficient documentation

## 2014-06-22 DIAGNOSIS — Z01818 Encounter for other preprocedural examination: Secondary | ICD-10-CM | POA: Diagnosis present

## 2014-06-22 HISTORY — DX: Fibromyalgia: M79.7

## 2014-06-22 HISTORY — DX: Gastro-esophageal reflux disease without esophagitis: K21.9

## 2014-06-22 LAB — PROTIME-INR
INR: 1.03 (ref 0.00–1.49)
Prothrombin Time: 13.6 seconds (ref 11.6–15.2)

## 2014-06-22 LAB — COMPREHENSIVE METABOLIC PANEL
ALT: 12 U/L (ref 0–35)
AST: 17 U/L (ref 0–37)
Albumin: 4.5 g/dL (ref 3.5–5.2)
Alkaline Phosphatase: 109 U/L (ref 39–117)
Anion gap: 11 (ref 5–15)
BILIRUBIN TOTAL: 0.7 mg/dL (ref 0.3–1.2)
BUN: 14 mg/dL (ref 6–23)
CALCIUM: 11 mg/dL — AB (ref 8.4–10.5)
CHLORIDE: 103 meq/L (ref 96–112)
CO2: 28 meq/L (ref 19–32)
Creatinine, Ser: 0.75 mg/dL (ref 0.50–1.10)
GFR calc Af Amer: >90 mL/min (ref >90)
GFR calc non Af Amer: 88 mL/min — ABNORMAL LOW (ref >90)
Glucose, Bld: 84 mg/dL (ref 70–99)
Potassium: 4.4 mEq/L (ref 3.7–5.3)
SODIUM: 142 meq/L (ref 137–147)
Total Protein: 8 g/dL (ref 6.0–8.3)

## 2014-06-22 LAB — SURGICAL PCR SCREEN
MRSA, PCR: NEGATIVE
STAPHYLOCOCCUS AUREUS: NEGATIVE

## 2014-06-22 LAB — URINALYSIS, ROUTINE W REFLEX MICROSCOPIC
Bilirubin Urine: NEGATIVE
Glucose, UA: NEGATIVE mg/dL
Hgb urine dipstick: NEGATIVE
Ketones, ur: NEGATIVE mg/dL
Leukocytes, UA: NEGATIVE
NITRITE: NEGATIVE
Protein, ur: NEGATIVE mg/dL
SPECIFIC GRAVITY, URINE: 1.015 (ref 1.005–1.030)
Urobilinogen, UA: 0.2 mg/dL (ref 0.0–1.0)
pH: 7 (ref 5.0–8.0)

## 2014-06-22 LAB — CBC
HCT: 45.1 % (ref 36.0–46.0)
Hemoglobin: 14.5 g/dL (ref 12.0–15.0)
MCH: 29.4 pg (ref 26.0–34.0)
MCHC: 32.2 g/dL (ref 30.0–36.0)
MCV: 91.3 fL (ref 78.0–100.0)
PLATELETS: 306 10*3/uL (ref 150–400)
RBC: 4.94 MIL/uL (ref 3.87–5.11)
RDW: 13.2 % (ref 11.5–15.5)
WBC: 8.3 10*3/uL (ref 4.0–10.5)

## 2014-06-22 LAB — APTT: aPTT: 30 seconds (ref 24–37)

## 2014-06-22 NOTE — Progress Notes (Signed)
EKG per chart 04/07/2014 Clearance note on chart per Dr Noah Delaine 04/07/2014

## 2014-06-22 NOTE — Patient Instructions (Signed)
Alexandra Gibson  06/22/2014   Your procedure is scheduled on:      Monday, November 16.2015  Report to [facility] Main Entrance and follow signs to  [place] arrive at [phone] AM.   Call this number if you have problems the morning of surgery [phone] or Presurgical Testing [phone].   Remember:  Do not eat food or drink liquids :After Midnight.  For Living Will and/or Health Care Power Attorney Forms: please provide copy for your medical record, may bring AM of surgery (forms should be already notarized-we do not provide this service).     Take these medicines the morning of surgery with A SIP OF WATER: Amlodipine (Norvasc);Allegra if needed;Protonix                                You may not have any metal on your body including hair pins and piercings  Do not wear jewelry, make-up, lotions, powders, or deodorant.  Do not shave body hair  48 hours(2 days) of CHG soap use              Do not bring valuables to the hospital. [place].  Contacts, dentures or bridgework may not be worn into surgery.  Leave suitcase in the car. After surgery it may be brought to your room.  For patients admitted to the hospital, checkout time is 11:00 AM the day of discharge.     Special Instructions: review fact sheets for MRSA information, Blood Transfusion fact sheet, Incentive Spirometry.   ________________________________________________________________________  [facility] - Preparing for Surgery Before surgery, you can play an important role.  Because skin is not sterile, your skin needs to be as free of germs as possible.  You can reduce the number of germs on your skin by washing with CHG (chlorahexidine gluconate) soap before surgery.  CHG is an antiseptic cleaner which kills germs and bonds with the skin to continue killing germs even after washing. Please DO NOT use if you have an allergy to CHG or antibacterial soaps.  If your skin  becomes reddened/irritated stop using the CHG and inform your nurse when you arrive at Short Stay. Do not shave (including legs and underarms) for at least 48 hours prior to the first CHG shower.  You may shave your face/neck. Please follow these instructions carefully:  1.  Shower with CHG Soap the night before surgery and the  morning of Surgery.  2.  If you choose to wash your hair, wash your hair first as usual with your  normal  shampoo.  3.  After you shampoo, rinse your hair and body thoroughly to remove the  shampoo.                           4.  Use CHG as you would any other liquid soap.  You can apply chg directly  to the skin and wash                       Gently with a scrungie or clean washcloth.  5.  Apply the CHG Soap to your body ONLY FROM THE NECK DOWN.   Do not use on face/ open  Wound or open sores. Avoid contact with eyes, ears mouth and genitals (private parts).                       Wash face,  Genitals (private parts) with your normal soap.             6.  Wash thoroughly, paying special attention to the area where your surgery  will be performed.  7.  Thoroughly rinse your body with warm water from the neck down.  8.  DO NOT shower/wash with your normal soap after using and rinsing off  the CHG Soap.                9.  Pat yourself dry with a clean towel.            10.  Wear clean pajamas.            11.  Place clean sheets on your bed the night of your first shower and do not  sleep with pets. Day of Surgery : Do not apply any lotions/deodorants the morning of surgery.  Please wear clean clothes to the hospital/surgery center.  FAILURE TO FOLLOW THESE INSTRUCTIONS MAY RESULT IN THE CANCELLATION OF YOUR SURGERY PATIENT SIGNATURE_________________________________  NURSE SIGNATURE__________________________________  ________________________________________________________________________   Alexandra Gibson  An incentive spirometer is a  tool that can help keep your lungs clear and active. This tool measures how well you are filling your lungs with each breath. Taking long deep breaths may help reverse or decrease the chance of developing breathing (pulmonary) problems (especially infection) following:  A long period of time when you are unable to move or be active. BEFORE THE PROCEDURE   If the spirometer includes an indicator to show your best effort, your nurse or respiratory therapist will set it to a desired goal.  If possible, sit up straight or lean slightly forward. Try not to slouch.  Hold the incentive spirometer in an upright position. INSTRUCTIONS FOR USE   Sit on the edge of your bed if possible, or sit up as far as you can in bed or on a chair.  Hold the incentive spirometer in an upright position.  Breathe out normally.  Place the mouthpiece in your mouth and seal your lips tightly around it.  Breathe in slowly and as deeply as possible, raising the piston or the ball toward the top of the column.  Hold your breath for 3-5 seconds or for as long as possible. Allow the piston or ball to fall to the bottom of the column.  Remove the mouthpiece from your mouth and breathe out normally.  Rest for a few seconds and repeat Steps 1 through 7 at least 10 times every 1-2 hours when you are awake. Take your time and take a few normal breaths between deep breaths.  The spirometer may include an indicator to show your best effort. Use the indicator as a goal to work toward during each repetition.  After each set of 10 deep breaths, practice coughing to be sure your lungs are clear. If you have an incision (the cut made at the time of surgery), support your incision when coughing by placing a pillow or rolled up towels firmly against it. Once you are able to get out of bed, walk around indoors and cough well. You may stop using the incentive spirometer when instructed by your caregiver.  RISKS AND  COMPLICATIONS  Take your time so you do not  get dizzy or light-headed.  If you are in pain, you may need to take or ask for pain medication before doing incentive spirometry. It is harder to take a deep breath if you are having pain. AFTER USE  Rest and breathe slowly and easily.  It can be helpful to keep track of a log of your progress. Your caregiver can provide you with a simple table to help with this. If you are using the spirometer at home, follow these instructions: Comfort IF:   You are having difficultly using the spirometer.  You have trouble using the spirometer as often as instructed.  Your pain medication is not giving enough relief while using the spirometer.  You develop fever of 100.5 F (38.1 C) or higher. SEEK IMMEDIATE MEDICAL CARE IF:   You cough up bloody sputum that had not been present before.  You develop fever of 102 F (38.9 C) or greater.  You develop worsening pain at or near the incision site. MAKE SURE YOU:   Understand these instructions.  Will watch your condition.  Will get help right away if you are not doing well or get worse. Document Released: 12/11/2006 Document Revised: 10/23/2011 Document Reviewed: 02/11/2007 ExitCare Patient Information 2014 [employer], [place].   ________________________________________________________________________  WHAT IS A BLOOD TRANSFUSION? Blood Transfusion Information  A transfusion is the replacement of blood or some of its parts. Blood is made up of multiple cells which provide different functions.  Red blood cells carry oxygen and are used for blood loss replacement.  White blood cells fight against infection.  Platelets control bleeding.  Plasma helps clot blood.  Other blood products are available for specialized needs, such as hemophilia or other clotting disorders. BEFORE THE TRANSFUSION  Who gives blood for transfusions?   Healthy volunteers who are fully evaluated to make sure  their blood is safe. This is blood bank blood. Transfusion therapy is the safest it has ever been in the practice of medicine. Before blood is taken from a donor, a complete history is taken to make sure that person has no history of diseases nor engages in risky social behavior (examples are intravenous drug use or sexual activity with multiple partners). The donor's travel history is screened to minimize risk of transmitting infections, such as malaria. The donated blood is tested for signs of infectious diseases, such as HIV and hepatitis. The blood is then tested to be sure it is compatible with you in order to minimize the chance of a transfusion reaction. If you or a relative donates blood, this is often done in anticipation of surgery and is not appropriate for emergency situations. It takes many days to process the donated blood. RISKS AND COMPLICATIONS Although transfusion therapy is very safe and saves many lives, the main dangers of transfusion include:   Getting an infectious disease.  Developing a transfusion reaction. This is an allergic reaction to something in the blood you were given. Every precaution is taken to prevent this. The decision to have a blood transfusion has been considered carefully by your caregiver before blood is given. Blood is not given unless the benefits outweigh the risks. AFTER THE TRANSFUSION  Right after receiving a blood transfusion, you will usually feel much better and more energetic. This is especially true if your red blood cells have gotten low (anemic). The transfusion raises the level of the red blood cells which carry oxygen, and this usually causes an energy increase.  The nurse administering the transfusion will  monitor you carefully for complications. HOME CARE INSTRUCTIONS  No special instructions are needed after a transfusion. You may find your energy is better. Speak with your caregiver about any limitations on activity for underlying diseases  you may have. SEEK MEDICAL CARE IF:   Your condition is not improving after your transfusion.  You develop redness or irritation at the intravenous (IV) site. SEEK IMMEDIATE MEDICAL CARE IF:  Any of the following symptoms occur over the next 12 hours:  Shaking chills.  You have a temperature by mouth above 102 F (38.9 C), not controlled by medicine.  Chest, back, or muscle pain.  People around you feel you are not acting correctly or are confused.  Shortness of breath or difficulty breathing.  Dizziness and fainting.  You get a rash or develop hives.  You have a decrease in urine output.  Your urine turns a dark color or changes to pink, red, or brown. Any of the following symptoms occur over the next 10 days:  You have a temperature by mouth above 102 F (38.9 C), not controlled by medicine.  Shortness of breath.  Weakness after normal activity.  The white part of the eye turns yellow (jaundice).  You have a decrease in the amount of urine or are urinating less often.  Your urine turns a dark color or changes to pink, red, or brown. Document Released: 07/28/2000 Document Revised: 10/23/2011 Document Reviewed: 03/16/2008 [facility] Patient Information 2014 [place], [place].  _______________________________________________________________________

## 2014-06-24 ENCOUNTER — Other Ambulatory Visit: Payer: Self-pay | Admitting: Surgical

## 2014-06-24 NOTE — H&P (Signed)
TOTAL KNEE ADMISSION H&P  Patient is being admitted for left total knee arthroplasty.  Subjective:  Chief Complaint:left knee pain.  HPI: Alexandra Gibson, 63 y.o. female, has a history of pain and functional disability in the left knee due to arthritis and has failed non-surgical conservative treatments for greater than 12 weeks to includeNSAID's and/or analgesics, corticosteriod injections, viscosupplementation injections and activity modification.  Onset of symptoms was gradual, starting >10 years ago with gradually worsening course since that time. The patient noted prior procedures on the knee to include  arthroscopy and menisectomy on the left knee(s).  Patient currently rates pain in the left knee(s) at 7 out of 10 with activity. Patient has night pain, worsening of pain with activity and weight bearing, pain that interferes with activities of daily living, pain with passive range of motion, crepitus and joint swelling.  Patient has evidence of periarticular osteophytes and joint space narrowing by imaging studies.  There is no active infection.  Patient Active Problem List   Diagnosis Date Noted  . BENIGN NEOPLASM OF PARATHYROID GLAND 12/13/2007  . HYPERCALCEMIA 08/12/2007  . RESTLESS LEGS SYNDROME 08/12/2007  . CARPAL TUNNEL SYNDROME, BILATERAL 07/04/2007  . OSTEOARTHRITIS, MULTIPLE JOINTS 07/04/2007  . HYPERCHOLESTEROLEMIA 10/11/2006  . DEPRESSION, MAJOR, RECURRENT 10/11/2006  . HYPERTENSION, BENIGN SYSTEMIC 10/11/2006  . GASTROESOPHAGEAL REFLUX, NO ESOPHAGITIS 10/11/2006  . MENOPAUSAL SYNDROME 10/11/2006  . FIBROMYALGIA, FIBROMYOSITIS 10/11/2006   Past Medical History  Diagnosis Date  . Hypertension   . Hyperlipidemia   . Parathyroid tumor   . Osteoarthritis of knee     bil-gets injections  . GERD (gastroesophageal reflux disease)   . Fibromyalgia     Past Surgical History  Procedure Laterality Date  . Appendectomy    . Laparoscopic endometriosis fulguration    . Knee  arthroscopy      left     Current outpatient prescriptions:  amLODipine (NORVASC) 5 MG tablet, Take 5 mg by mouth every morning., Disp: , Rfl: ;   Cholecalciferol (VITAMIN D-3) 5000 UNITS TABS, Take 1 tablet by mouth daily.  , Disp: , Rfl: ;   fexofenadine (ALLEGRA) 180 MG tablet, Take 180 mg by mouth daily., Disp: , Rfl: ;   Flaxseed, Linseed, (FLAX SEED OIL PO), Take 1 capsule by mouth daily., Disp: , Rfl:  irbesartan (AVAPRO) 300 MG tablet, Take 300 mg by mouth every morning., Disp: , Rfl: ;   meloxicam (MOBIC) 15 MG tablet, Take 15 mg by mouth daily.  , Disp: , Rfl: ;   Multiple Vitamin (MULTIVITAMIN) tablet, Take 1 tablet by mouth daily.  , Disp: , Rfl: ;   pantoprazole (PROTONIX) 40 MG tablet, Take 40 mg by mouth daily.  , Disp: , Rfl: ;   rosuvastatin (CRESTOR) 10 MG tablet, Take 10 mg by mouth every Monday, Wednesday, and Friday. , Disp: , Rfl:  traMADol (ULTRAM) 50 MG tablet, Take 100 mg by mouth 3 (three) times daily as needed for moderate pain. , Disp: , Rfl:   Allergies  Allergen Reactions  . Atorvastatin Other (See Comments)    REACTION: leg cramp    History  Substance Use Topics  . Smoking status: Never Smoker   . Smokeless tobacco: Never Used  . Alcohol Use: No    Family History  Problem Relation Age of Onset  . Colon cancer Neg Hx      Review of Systems  Constitutional: Negative.   HENT: Negative.   Eyes: Negative.   Respiratory: Negative.   Cardiovascular:  Negative.   Gastrointestinal: Positive for heartburn. Negative for nausea, vomiting, abdominal pain, diarrhea, constipation, blood in stool and melena.  Genitourinary: Negative for dysuria, urgency, frequency, hematuria and flank pain.       Positive for incontinence  Musculoskeletal: Positive for joint pain. Negative for myalgias, back pain, falls and neck pain.       Left knee pain  Skin: Negative.   Neurological: Negative.   Endo/Heme/Allergies: Positive for environmental allergies. Negative for  polydipsia. Does not bruise/bleed easily.  Psychiatric/Behavioral: Negative.     Objective:  Physical Exam  Constitutional: She is oriented to person, place, and time. She appears well-developed. No distress.  Overweight  HENT:  Head: Normocephalic and atraumatic.  Right Ear: External ear normal.  Left Ear: External ear normal.  Nose: Nose normal.  Mouth/Throat: Oropharynx is clear and moist.  Eyes: Conjunctivae and EOM are normal.  Neck: Normal range of motion. Neck supple.  Cardiovascular: Normal rate, regular rhythm, normal heart sounds and intact distal pulses.   No murmur heard. Respiratory: Effort normal and breath sounds normal. No respiratory distress. She has no wheezes.  GI: Soft. Bowel sounds are normal. She exhibits no distension. There is no tenderness.  Musculoskeletal:       Right hip: Normal.       Left hip: Normal.       Right knee: She exhibits decreased range of motion and swelling. She exhibits no effusion and no erythema. Tenderness found. Lateral joint line tenderness noted. No medial joint line tenderness noted.       Left knee: She exhibits decreased range of motion, swelling and effusion. She exhibits no laceration. Tenderness found. Medial joint line and lateral joint line tenderness noted.       Right lower leg: She exhibits no tenderness and no swelling.       Left lower leg: She exhibits no tenderness and no swelling.  Evaluation of her knees show a trace effusion on the left. No effusion on the right with marked crepitus on range of motion in both knees. She is tender diffusely through the left. There is some lateral tenderness on the right with no instability.   Neurological: She is alert and oriented to person, place, and time. She has normal strength and normal reflexes. No sensory deficit.  Skin: No rash noted. She is not diaphoretic. No erythema.  Psychiatric: She has a normal mood and affect. Her behavior is normal.    Vitals  Weight: 192.8 lb  Height: 68.5in Body Surface Area: 2.06 m Body Mass Index: 28.89 kg/m  Pulse: 76 (Regular)  BP: 126/74 (Sitting, Left Arm, Standard)  Imaging Review Plain radiographs demonstrate severe degenerative joint disease of the left knee(s). The overall alignment ismild valgus. The bone quality appears to be good for age and reported activity level. Assessment/Plan:  End stage arthritis, left knee   The patient history, physical examination, clinical judgment of the provider and imaging studies are consistent with end stage degenerative joint disease of the left knee(s) and total knee arthroplasty is deemed medically necessary. The treatment options including medical management, injection therapy arthroscopy and arthroplasty were discussed at length. The risks and benefits of total knee arthroplasty were presented and reviewed. The risks due to aseptic loosening, infection, stiffness, patella tracking problems, thromboembolic complications and other imponderables were discussed. The patient acknowledged the explanation, agreed to proceed with the plan and consent was signed. Patient is being admitted for inpatient treatment for surgery, pain control, PT, OT, prophylactic antibiotics, VTE  prophylaxis, progressive ambulation and ADL's and discharge planning. The patient is planning to be discharged to skilled nursing facility ([facility])   TXA IV  PCP: Dr. Thressa Sheller  Ardeen Jourdain, PA-C

## 2014-06-29 ENCOUNTER — Inpatient Hospital Stay (HOSPITAL_COMMUNITY): Payer: Federal, State, Local not specified - PPO | Admitting: Anesthesiology

## 2014-06-29 ENCOUNTER — Encounter (HOSPITAL_COMMUNITY)
Admission: RE | Disposition: A | Discharge status: Skilled Nursing Facility | Payer: Self-pay | Source: Ambulatory Visit | Attending: Orthopedic Surgery

## 2014-06-29 ENCOUNTER — Inpatient Hospital Stay (HOSPITAL_COMMUNITY)
Admission: RE | Admit: 2014-06-29 | Discharge: 2014-07-02 | DRG: 470 | Disposition: A | Discharge status: Skilled Nursing Facility | Payer: Federal, State, Local not specified - PPO | Source: Ambulatory Visit | Attending: Orthopedic Surgery | Admitting: Orthopedic Surgery

## 2014-06-29 ENCOUNTER — Encounter (HOSPITAL_COMMUNITY): Payer: Self-pay | Admitting: *Deleted

## 2014-06-29 DIAGNOSIS — Z79899 Other long term (current) drug therapy: Secondary | ICD-10-CM

## 2014-06-29 DIAGNOSIS — M797 Fibromyalgia: Secondary | ICD-10-CM | POA: Diagnosis present

## 2014-06-29 DIAGNOSIS — E785 Hyperlipidemia, unspecified: Secondary | ICD-10-CM | POA: Diagnosis present

## 2014-06-29 DIAGNOSIS — K219 Gastro-esophageal reflux disease without esophagitis: Secondary | ICD-10-CM | POA: Diagnosis present

## 2014-06-29 DIAGNOSIS — M1712 Unilateral primary osteoarthritis, left knee: Principal | ICD-10-CM | POA: Diagnosis present

## 2014-06-29 DIAGNOSIS — M1710 Unilateral primary osteoarthritis, unspecified knee: Secondary | ICD-10-CM | POA: Diagnosis present

## 2014-06-29 DIAGNOSIS — M25562 Pain in left knee: Secondary | ICD-10-CM | POA: Diagnosis not present

## 2014-06-29 DIAGNOSIS — I10 Essential (primary) hypertension: Secondary | ICD-10-CM | POA: Diagnosis present

## 2014-06-29 DIAGNOSIS — M179 Osteoarthritis of knee, unspecified: Secondary | ICD-10-CM | POA: Diagnosis present

## 2014-06-29 DIAGNOSIS — M21062 Valgus deformity, not elsewhere classified, left knee: Secondary | ICD-10-CM | POA: Diagnosis present

## 2014-06-29 HISTORY — PX: TOTAL KNEE ARTHROPLASTY: SHX125

## 2014-06-29 LAB — TYPE AND SCREEN
ABO/RH(D): O POS
Antibody Screen: NEGATIVE

## 2014-06-29 LAB — ABO/RH: ABO/RH(D): O POS

## 2014-06-29 SURGERY — ARTHROPLASTY, KNEE, TOTAL
Anesthesia: General | Site: Knee | Laterality: Left

## 2014-06-29 MED ORDER — ONDANSETRON HCL 4 MG/2ML IJ SOLN: 4.0000 mg | Freq: Four times a day (QID) | INTRAMUSCULAR | Status: DC | PRN

## 2014-06-29 MED ORDER — NEOSTIGMINE METHYLSULFATE 10 MG/10ML IV SOLN
INTRAVENOUS | Status: DC | PRN
  Administered 2014-06-29: 4 mg via INTRAVENOUS

## 2014-06-29 MED ORDER — GLYCOPYRROLATE 0.2 MG/ML IJ SOLN
INTRAMUSCULAR | Status: DC | PRN
  Administered 2014-06-29: .4 mg via INTRAVENOUS

## 2014-06-29 MED ORDER — MORPHINE SULFATE 2 MG/ML IJ SOLN
1.0000 mg | INTRAMUSCULAR | Status: DC | PRN
  Administered 2014-06-29: 2 mg via INTRAVENOUS
  Administered 2014-06-29: 1 mg via INTRAVENOUS
  Administered 2014-06-29 – 2014-06-30 (×7): 2 mg via INTRAVENOUS
  Filled 2014-06-29 (×9): qty 1

## 2014-06-29 MED ORDER — 0.9 % SODIUM CHLORIDE (POUR BTL) OPTIME
TOPICAL | Status: DC | PRN
  Administered 2014-06-29: 1000 mL

## 2014-06-29 MED ORDER — HYDROMORPHONE HCL 2 MG/ML IJ SOLN
INTRAMUSCULAR | Status: AC
  Filled 2014-06-29: qty 1

## 2014-06-29 MED ORDER — OXYCODONE HCL 5 MG PO TABS
5.0000 mg | ORAL_TABLET | ORAL | Status: DC | PRN
  Administered 2014-06-29 (×2): 10 mg via ORAL
  Administered 2014-06-29: 5 mg via ORAL
  Administered 2014-06-30 – 2014-07-02 (×16): 10 mg via ORAL
  Filled 2014-06-29 (×18): qty 2
  Filled 2014-06-29: qty 1

## 2014-06-29 MED ORDER — TRAMADOL HCL 50 MG PO TABS
50.0000 mg | ORAL_TABLET | Freq: Four times a day (QID) | ORAL | Status: DC | PRN
  Administered 2014-06-30: 100 mg via ORAL
  Filled 2014-06-29: qty 2

## 2014-06-29 MED ORDER — CHLORHEXIDINE GLUCONATE 4 % EX LIQD: 60.0000 mL | Freq: Once | CUTANEOUS | Status: DC

## 2014-06-29 MED ORDER — CEFAZOLIN SODIUM-DEXTROSE 2-3 GM-% IV SOLR
2.0000 g | INTRAVENOUS | Status: AC
  Administered 2014-06-29: 2 g via INTRAVENOUS

## 2014-06-29 MED ORDER — DEXAMETHASONE SODIUM PHOSPHATE 10 MG/ML IJ SOLN: 10.0000 mg | Freq: Once | INTRAMUSCULAR | Status: DC

## 2014-06-29 MED ORDER — METOCLOPRAMIDE HCL 5 MG/ML IJ SOLN: 5.0000 mg | Freq: Three times a day (TID) | INTRAMUSCULAR | Status: DC | PRN

## 2014-06-29 MED ORDER — BUPIVACAINE HCL (PF) 0.25 % IJ SOLN
INTRAMUSCULAR | Status: AC
  Filled 2014-06-29: qty 30

## 2014-06-29 MED ORDER — SODIUM CHLORIDE 0.9 % IR SOLN
Status: DC | PRN
  Administered 2014-06-29: 1000 mL

## 2014-06-29 MED ORDER — KETOROLAC TROMETHAMINE 15 MG/ML IJ SOLN: 7.5000 mg | Freq: Four times a day (QID) | INTRAMUSCULAR | Status: AC | PRN

## 2014-06-29 MED ORDER — LIDOCAINE HCL (CARDIAC) 20 MG/ML IV SOLN
INTRAVENOUS | Status: DC | PRN
  Administered 2014-06-29: 80 mg via INTRAVENOUS

## 2014-06-29 MED ORDER — SODIUM CHLORIDE 0.9 % IV SOLN: INTRAVENOUS | Status: DC

## 2014-06-29 MED ORDER — PROPOFOL 10 MG/ML IV BOLUS
INTRAVENOUS | Status: DC | PRN
  Administered 2014-06-29: 150 mg via INTRAVENOUS

## 2014-06-29 MED ORDER — AMLODIPINE BESYLATE 5 MG PO TABS
5.0000 mg | ORAL_TABLET | Freq: Every morning | ORAL | Status: DC
  Administered 2014-06-30 – 2014-07-02 (×3): 5 mg via ORAL
  Filled 2014-06-29 (×3): qty 1

## 2014-06-29 MED ORDER — FENTANYL CITRATE 0.05 MG/ML IJ SOLN
INTRAMUSCULAR | Status: AC
  Filled 2014-06-29: qty 2

## 2014-06-29 MED ORDER — TRANEXAMIC ACID 100 MG/ML IV SOLN
1000.0000 mg | INTRAVENOUS | Status: AC
  Administered 2014-06-29: 1000 mg via INTRAVENOUS
  Filled 2014-06-29: qty 10

## 2014-06-29 MED ORDER — MENTHOL 3 MG MT LOZG
1.0000 | LOZENGE | OROMUCOSAL | Status: DC | PRN
  Filled 2014-06-29: qty 9

## 2014-06-29 MED ORDER — DIPHENHYDRAMINE HCL 12.5 MG/5ML PO ELIX: 12.5000 mg | ORAL_SOLUTION | ORAL | Status: DC | PRN

## 2014-06-29 MED ORDER — HYDROMORPHONE HCL 1 MG/ML IJ SOLN
INTRAMUSCULAR | Status: AC
  Filled 2014-06-29: qty 1

## 2014-06-29 MED ORDER — SODIUM CHLORIDE 0.9 % IJ SOLN
INTRAMUSCULAR | Status: AC
  Filled 2014-06-29: qty 50

## 2014-06-29 MED ORDER — BISACODYL 10 MG RE SUPP: 10.0000 mg | Freq: Every day | RECTAL | Status: DC | PRN

## 2014-06-29 MED ORDER — LORATADINE 10 MG PO TABS
10.0000 mg | ORAL_TABLET | Freq: Every day | ORAL | Status: DC
  Administered 2014-06-30 – 2014-07-02 (×3): 10 mg via ORAL
  Filled 2014-06-29 (×3): qty 1

## 2014-06-29 MED ORDER — RIVAROXABAN 10 MG PO TABS
10.0000 mg | ORAL_TABLET | Freq: Every day | ORAL | Status: DC
  Administered 2014-06-30 – 2014-07-02 (×3): 10 mg via ORAL
  Filled 2014-06-29 (×4): qty 1

## 2014-06-29 MED ORDER — CEFAZOLIN SODIUM-DEXTROSE 2-3 GM-% IV SOLR
2.0000 g | Freq: Four times a day (QID) | INTRAVENOUS | Status: AC
  Administered 2014-06-29 (×2): 2 g via INTRAVENOUS
  Filled 2014-06-29 (×2): qty 50

## 2014-06-29 MED ORDER — METHOCARBAMOL 500 MG PO TABS
500.0000 mg | ORAL_TABLET | Freq: Four times a day (QID) | ORAL | Status: DC | PRN
  Administered 2014-06-29 – 2014-07-01 (×6): 500 mg via ORAL
  Filled 2014-06-29 (×6): qty 1

## 2014-06-29 MED ORDER — BUPIVACAINE LIPOSOME 1.3 % IJ SUSP
20.0000 mL | Freq: Once | INTRAMUSCULAR | Status: AC
  Administered 2014-06-29: 20 mL
  Filled 2014-06-29: qty 20

## 2014-06-29 MED ORDER — DOCUSATE SODIUM 100 MG PO CAPS
100.0000 mg | ORAL_CAPSULE | Freq: Two times a day (BID) | ORAL | Status: DC
  Administered 2014-06-29 – 2014-07-02 (×6): 100 mg via ORAL

## 2014-06-29 MED ORDER — ACETAMINOPHEN 650 MG RE SUPP: 650.0000 mg | Freq: Four times a day (QID) | RECTAL | Status: DC | PRN

## 2014-06-29 MED ORDER — CEFAZOLIN SODIUM-DEXTROSE 2-3 GM-% IV SOLR
INTRAVENOUS | Status: AC
  Filled 2014-06-29: qty 50

## 2014-06-29 MED ORDER — FENTANYL CITRATE 0.05 MG/ML IJ SOLN
INTRAMUSCULAR | Status: DC | PRN
  Administered 2014-06-29 (×4): 50 ug via INTRAVENOUS

## 2014-06-29 MED ORDER — METOCLOPRAMIDE HCL 10 MG PO TABS: 5.0000 mg | ORAL_TABLET | Freq: Three times a day (TID) | ORAL | Status: DC | PRN

## 2014-06-29 MED ORDER — ROCURONIUM BROMIDE 100 MG/10ML IV SOLN
INTRAVENOUS | Status: DC | PRN
  Administered 2014-06-29: 40 mg via INTRAVENOUS

## 2014-06-29 MED ORDER — LIDOCAINE HCL (CARDIAC) 20 MG/ML IV SOLN
INTRAVENOUS | Status: AC
Start: 2014-06-29 — End: 2014-06-29
  Filled 2014-06-29: qty 5

## 2014-06-29 MED ORDER — NEOSTIGMINE METHYLSULFATE 10 MG/10ML IV SOLN
INTRAVENOUS | Status: AC
  Filled 2014-06-29: qty 1

## 2014-06-29 MED ORDER — DEXAMETHASONE SODIUM PHOSPHATE 10 MG/ML IJ SOLN
10.0000 mg | Freq: Once | INTRAMUSCULAR | Status: AC
  Administered 2014-06-30: 10 mg via INTRAVENOUS
  Filled 2014-06-29: qty 1

## 2014-06-29 MED ORDER — LACTATED RINGERS IV SOLN
INTRAVENOUS | Status: DC
  Administered 2014-06-29: 1000 mL via INTRAVENOUS

## 2014-06-29 MED ORDER — DEXTROSE-NACL 5-0.9 % IV SOLN
INTRAVENOUS | Status: DC
  Administered 2014-06-29 – 2014-06-30 (×2): via INTRAVENOUS

## 2014-06-29 MED ORDER — HYDROMORPHONE HCL 1 MG/ML IJ SOLN
0.2500 mg | INTRAMUSCULAR | Status: DC | PRN
  Administered 2014-06-29 (×4): 0.5 mg via INTRAVENOUS

## 2014-06-29 MED ORDER — HYDROMORPHONE HCL 1 MG/ML IJ SOLN
INTRAMUSCULAR | Status: DC | PRN
  Administered 2014-06-29 (×4): 0.5 mg via INTRAVENOUS

## 2014-06-29 MED ORDER — LIP MEDEX EX OINT
TOPICAL_OINTMENT | CUTANEOUS | Status: AC
  Filled 2014-06-29: qty 7

## 2014-06-29 MED ORDER — PROPOFOL 10 MG/ML IV BOLUS
INTRAVENOUS | Status: AC
  Filled 2014-06-29: qty 20

## 2014-06-29 MED ORDER — PANTOPRAZOLE SODIUM 40 MG PO TBEC
40.0000 mg | DELAYED_RELEASE_TABLET | Freq: Every day | ORAL | Status: DC
  Administered 2014-06-30 – 2014-07-02 (×3): 40 mg via ORAL
  Filled 2014-06-29 (×3): qty 1

## 2014-06-29 MED ORDER — SODIUM CHLORIDE 0.9 % IJ SOLN
INTRAMUSCULAR | Status: DC | PRN
  Administered 2014-06-29: 30 mL via INTRAVENOUS

## 2014-06-29 MED ORDER — ONDANSETRON HCL 4 MG/2ML IJ SOLN
INTRAMUSCULAR | Status: DC | PRN
  Administered 2014-06-29: 4 mg via INTRAVENOUS

## 2014-06-29 MED ORDER — MIDAZOLAM HCL 5 MG/5ML IJ SOLN
INTRAMUSCULAR | Status: DC | PRN
  Administered 2014-06-29: 2 mg via INTRAVENOUS

## 2014-06-29 MED ORDER — GLYCOPYRROLATE 0.2 MG/ML IJ SOLN
INTRAMUSCULAR | Status: AC
  Filled 2014-06-29: qty 3

## 2014-06-29 MED ORDER — ONDANSETRON HCL 4 MG PO TABS: 4.0000 mg | ORAL_TABLET | Freq: Four times a day (QID) | ORAL | Status: DC | PRN

## 2014-06-29 MED ORDER — DEXAMETHASONE SODIUM PHOSPHATE 10 MG/ML IJ SOLN
INTRAMUSCULAR | Status: DC | PRN
  Administered 2014-06-29: 10 mg via INTRAVENOUS

## 2014-06-29 MED ORDER — DEXAMETHASONE SODIUM PHOSPHATE 10 MG/ML IJ SOLN
INTRAMUSCULAR | Status: AC
  Filled 2014-06-29: qty 1

## 2014-06-29 MED ORDER — ACETAMINOPHEN 325 MG PO TABS
650.0000 mg | ORAL_TABLET | Freq: Four times a day (QID) | ORAL | Status: DC | PRN
Start: 2014-06-30 — End: 2014-07-02

## 2014-06-29 MED ORDER — MIDAZOLAM HCL 2 MG/2ML IJ SOLN
INTRAMUSCULAR | Status: AC
  Filled 2014-06-29: qty 2

## 2014-06-29 MED ORDER — ACETAMINOPHEN 500 MG PO TABS
1000.0000 mg | ORAL_TABLET | Freq: Four times a day (QID) | ORAL | Status: AC
  Administered 2014-06-29 – 2014-06-30 (×4): 1000 mg via ORAL
  Filled 2014-06-29 (×7): qty 2

## 2014-06-29 MED ORDER — FLEET ENEMA 7-19 GM/118ML RE ENEM: 1.0000 | ENEMA | Freq: Once | RECTAL | Status: AC | PRN

## 2014-06-29 MED ORDER — POLYETHYLENE GLYCOL 3350 17 G PO PACK: 17.0000 g | PACK | Freq: Every day | ORAL | Status: DC | PRN

## 2014-06-29 MED ORDER — PHENOL 1.4 % MT LIQD
1.0000 | OROMUCOSAL | Status: DC | PRN
  Filled 2014-06-29: qty 177

## 2014-06-29 MED ORDER — LIP MEDEX EX OINT
TOPICAL_OINTMENT | CUTANEOUS | Status: DC | PRN
  Administered 2014-06-29: 13:00:00 via TOPICAL
  Filled 2014-06-29: qty 7

## 2014-06-29 MED ORDER — ACETAMINOPHEN 10 MG/ML IV SOLN
1000.0000 mg | Freq: Once | INTRAVENOUS | Status: AC
  Administered 2014-06-29: 1000 mg via INTRAVENOUS
  Filled 2014-06-29: qty 100

## 2014-06-29 MED ORDER — DEXTROSE 5 % IV SOLN
500.0000 mg | Freq: Four times a day (QID) | INTRAVENOUS | Status: DC | PRN
  Administered 2014-06-29: 500 mg via INTRAVENOUS
  Filled 2014-06-29 (×2): qty 5

## 2014-06-29 MED ORDER — IRBESARTAN 300 MG PO TABS
300.0000 mg | ORAL_TABLET | Freq: Every morning | ORAL | Status: DC
  Administered 2014-06-29 – 2014-07-02 (×4): 300 mg via ORAL
  Filled 2014-06-29 (×4): qty 1

## 2014-06-29 MED ORDER — HYDROMORPHONE HCL 1 MG/ML IJ SOLN
0.2500 mg | INTRAMUSCULAR | Status: DC | PRN
  Administered 2014-06-29 (×2): 0.5 mg via INTRAVENOUS

## 2014-06-29 MED ORDER — LACTATED RINGERS IV SOLN: INTRAVENOUS | Status: DC

## 2014-06-29 SURGICAL SUPPLY — 61 items
BAG SPEC THK2 15X12 ZIP CLS (MISCELLANEOUS) ×1
BAG ZIPLOCK 12X15 (MISCELLANEOUS) ×2 IMPLANT
BANDAGE ELASTIC 6 VELCRO ST LF (GAUZE/BANDAGES/DRESSINGS) ×2 IMPLANT
BANDAGE ESMARK 6X9 LF (GAUZE/BANDAGES/DRESSINGS) ×1 IMPLANT
BLADE SAG 18X100X1.27 (BLADE) ×2 IMPLANT
BLADE SAW SGTL 11.0X1.19X90.0M (BLADE) ×2 IMPLANT
BNDG CMPR 9X6 STRL LF SNTH (GAUZE/BANDAGES/DRESSINGS) ×1
BNDG ESMARK 6X9 LF (GAUZE/BANDAGES/DRESSINGS) ×2
BOWL SMART MIX CTS (DISPOSABLE) ×2 IMPLANT
CAP KNEE ATTUNE RP ×1 IMPLANT
CEMENT HV SMART SET (Cement) ×4 IMPLANT
CUFF TOURN SGL QUICK 34 (TOURNIQUET CUFF) ×2
CUFF TRNQT CYL 34X4X40X1 (TOURNIQUET CUFF) ×1 IMPLANT
DECANTER SPIKE VIAL GLASS SM (MISCELLANEOUS) ×2 IMPLANT
DRAPE EXTREMITY TIBURON (DRAPES) ×2 IMPLANT
DRAPE POUCH INSTRU U-SHP 10X18 (DRAPES) ×2 IMPLANT
DRAPE U-SHAPE 47X51 STRL (DRAPES) ×2 IMPLANT
DRSG ADAPTIC 3X8 NADH LF (GAUZE/BANDAGES/DRESSINGS) ×2 IMPLANT
DRSG PAD ABDOMINAL 8X10 ST (GAUZE/BANDAGES/DRESSINGS) ×2 IMPLANT
DURAPREP 26ML APPLICATOR (WOUND CARE) ×2 IMPLANT
ELECT REM PT RETURN 9FT ADLT (ELECTROSURGICAL) ×2
ELECTRODE REM PT RTRN 9FT ADLT (ELECTROSURGICAL) ×1 IMPLANT
EVACUATOR 1/8 PVC DRAIN (DRAIN) ×2 IMPLANT
FACESHIELD WRAPAROUND (MASK) ×10 IMPLANT
FACESHIELD WRAPAROUND OR TEAM (MASK) ×5 IMPLANT
GAUZE SPONGE 4X4 12PLY STRL (GAUZE/BANDAGES/DRESSINGS) ×2 IMPLANT
GLOVE BIO SURGEON STRL SZ7.5 (GLOVE) IMPLANT
GLOVE BIO SURGEON STRL SZ8 (GLOVE) ×2 IMPLANT
GLOVE BIOGEL PI IND STRL 6.5 (GLOVE) IMPLANT
GLOVE BIOGEL PI IND STRL 8 (GLOVE) ×1 IMPLANT
GLOVE BIOGEL PI INDICATOR 6.5 (GLOVE)
GLOVE BIOGEL PI INDICATOR 8 (GLOVE) ×1
GLOVE SURG SS PI 6.5 STRL IVOR (GLOVE) IMPLANT
GOWN STRL REUS W/TWL LRG LVL3 (GOWN DISPOSABLE) ×2 IMPLANT
GOWN STRL REUS W/TWL XL LVL3 (GOWN DISPOSABLE) IMPLANT
HANDPIECE INTERPULSE COAX TIP (DISPOSABLE) ×2
IMMOBILIZER KNEE 20 (SOFTGOODS) ×3 IMPLANT
IMMOBILIZER KNEE 20 THIGH 36 (SOFTGOODS) ×1 IMPLANT
KIT BASIN OR (CUSTOM PROCEDURE TRAY) ×2 IMPLANT
MANIFOLD NEPTUNE II (INSTRUMENTS) ×2 IMPLANT
NDL SAFETY ECLIPSE 18X1.5 (NEEDLE) ×2 IMPLANT
NEEDLE HYPO 18GX1.5 SHARP (NEEDLE) ×4
NS IRRIG 1000ML POUR BTL (IV SOLUTION) ×2 IMPLANT
PACK TOTAL JOINT (CUSTOM PROCEDURE TRAY) ×2 IMPLANT
PAD ABD 8X10 STRL (GAUZE/BANDAGES/DRESSINGS) ×1 IMPLANT
PADDING CAST COTTON 6X4 STRL (CAST SUPPLIES) ×3 IMPLANT
POSITIONER SURGICAL ARM (MISCELLANEOUS) ×2 IMPLANT
SET HNDPC FAN SPRY TIP SCT (DISPOSABLE) ×1 IMPLANT
STRIP CLOSURE SKIN 1/2X4 (GAUZE/BANDAGES/DRESSINGS) ×3 IMPLANT
SUCTION FRAZIER 12FR DISP (SUCTIONS) ×2 IMPLANT
SUT MNCRL AB 4-0 PS2 18 (SUTURE) ×2 IMPLANT
SUT VIC AB 2-0 CT1 27 (SUTURE) ×6
SUT VIC AB 2-0 CT1 TAPERPNT 27 (SUTURE) ×3 IMPLANT
SUT VLOC 180 0 24IN GS25 (SUTURE) ×2 IMPLANT
SYR 20CC LL (SYRINGE) ×2 IMPLANT
SYR 50ML LL SCALE MARK (SYRINGE) ×2 IMPLANT
TOWEL OR 17X26 10 PK STRL BLUE (TOWEL DISPOSABLE) ×2 IMPLANT
TOWEL OR NON WOVEN STRL DISP B (DISPOSABLE) IMPLANT
TRAY FOLEY CATH 14FRSI W/METER (CATHETERS) ×2 IMPLANT
WATER STERILE IRR 1500ML POUR (IV SOLUTION) ×2 IMPLANT
WRAP KNEE MAXI GEL POST OP (GAUZE/BANDAGES/DRESSINGS) ×2 IMPLANT

## 2014-06-29 NOTE — Evaluation (Signed)
Physical Therapy Evaluation Patient Details Name: Alexandra Gibson MRN: [id number] DOB: 1950-09-08 Today's Date: 06/29/2014   History of Present Illness  LTKA  Clinical Impression  Patient  C/o considerable pain today, did stand at bedside. Patient wants to go to snf rehab at [facility]. Patient will bnefit from PT to address problems listed.    Follow Up Recommendations SNF    Equipment Recommendations  Rolling walker with 5" wheels    Recommendations for Other Services       Precautions / Restrictions Precautions Precautions: Knee;Fall Required Braces or Orthoses: Knee Immobilizer - Left Knee Immobilizer - Left: Discontinue once straight leg raise with < 10 degree lag      Mobility  Bed Mobility Overal bed mobility: Needs Assistance Bed Mobility: Supine to Sit;Sit to Supine     Supine to sit: Mod assist;HOB elevated Sit to supine: Mod assist   General bed mobility comments: cues for sequence and hand placement  Transfers Overall transfer level: Needs assistance Equipment used: Rolling walker (2 wheeled) Transfers: Sit to/from Stand Sit to Stand: From elevated surface;Mod assist;+2 safety/equipment         General transfer comment: cues for hand placement, support LLE  Ambulation/Gait Ambulation/Gait assistance: Mod assist;+2 safety/equipment Ambulation Distance (Feet): 4 Feet Assistive device: Rolling walker (2 wheeled) Gait Pattern/deviations: Step-to pattern;Decreased stance time - left     General Gait Details: side steps along bed, not very much weight placed on L  Stairs            Wheelchair Mobility    Modified Rankin (Stroke Patients Only)       Balance                                             Pertinent Vitals/Pain Pain Assessment: 0-10 Pain Score: 8  Pain Location: L knee Pain Descriptors / Indicators: Aching;Discomfort;Constant;Squeezing;Throbbing Pain Intervention(s): Limited activity within patient's  tolerance;Monitored during session;Premedicated before session;Patient requesting pain meds-RN notified;Ice applied;Repositioned    Home Living Family/patient expects to be discharged to:: Private residence Living Arrangements: Spouse/significant other Available Help at Discharge: Family Type of Home: House Home Access: Stairs to enter Entrance Stairs-Rails: Psychiatric nurse of Steps: 3 Home Layout: One level Home Equipment: [place] - single point Additional Comments: patient wants to consider SNF    Prior Function Level of Independence: Independent with assistive device(s)         Comments: needed cane for walking     Hand Dominance        Extremity/Trunk Assessment               Lower Extremity Assessment: LLE deficits/detail   LLE Deficits / Details: limited  movement beyond  moving to sitting and back to bed with mod assist  Cervical / Trunk Assessment: Normal  Communication   Communication: No difficulties  Cognition Arousal/Alertness: Awake/alert Behavior During Therapy: Anxious Overall Cognitive Status: Within Functional Limits for tasks assessed                      General Comments      Exercises        Assessment/Plan    PT Assessment Patient needs continued PT services  PT Diagnosis Difficulty walking;Acute pain   PT Problem List Decreased strength;Decreased range of motion;Decreased activity tolerance;Decreased mobility;Decreased knowledge of use of DME;Decreased safety awareness;Decreased knowledge of precautions;Pain  PT Treatment Interventions DME instruction;Gait training;Stair training;Functional mobility training;Therapeutic activities;Therapeutic exercise;Patient/family education   PT Goals (Current goals can be found in the Care Plan section) Acute Rehab PT Goals Patient Stated Goal: to not have pain PT Goal Formulation: With patient/family Time For Goal Achievement: 07/06/14 Potential to Achieve Goals:  Good    Frequency 7X/week   Barriers to discharge        Co-evaluation               End of Session Equipment Utilized During Treatment: Left knee immobilizer Activity Tolerance: Patient limited by pain Patient left: in bed;with call bell/phone within reach;with family/visitor present Nurse Communication: Mobility status;Patient requests pain meds         Time: 7829-5621 PT Time Calculation (min) (ACUTE ONLY): 36 min   Charges:   PT Evaluation $Initial PT Evaluation Tier I: 1 Procedure PT Treatments $Therapeutic Activity: 23-37 mins   PT G Codes:          Claretha Cooper 06/29/2014, 5:08 PM Tresa Endo PT [phone]

## 2014-06-29 NOTE — Plan of Care (Signed)
Problem: Phase I Progression Outcomes Goal: CMS/Neurovascular status WDL Outcome: Completed/Met Date Met:  06/29/14     

## 2014-06-29 NOTE — Interval H&P Note (Signed)
History and Physical Interval Note:  06/29/2014 6:56 AM  Alexandra Gibson  has presented today for surgery, with the diagnosis of OA LEFT KNEE   The various methods of treatment have been discussed with the patient and family. After consideration of risks, benefits and other options for treatment, the patient has consented to  Procedure(s): TOTAL KNEE ARTHROPLASTY LEFT  (Left) as a surgical intervention .  The patient's history has been reviewed, patient examined, no change in status, stable for surgery.  I have reviewed the patient's chart and labs.  Questions were answered to the patient's satisfaction.     Gearlean Alf

## 2014-06-29 NOTE — Anesthesia Preprocedure Evaluation (Addendum)
Anesthesia Evaluation  Patient identified by MRN, date of birth, ID band Patient awake    Reviewed: Allergy & Precautions, H&P , NPO status , Patient's Chart, lab work & pertinent test results  Airway Mallampati: II  TM Distance: >3 FB Neck ROM: full    Dental no notable dental hx. (+) Teeth Intact, Dental Advisory Given   Pulmonary neg pulmonary ROS,  breath sounds clear to auscultation  Pulmonary exam normal       Cardiovascular Exercise Tolerance: Good hypertension, Pt. on medications Rhythm:regular Rate:Normal     Neuro/Psych Depression negative neurological ROS  negative psych ROS   GI/Hepatic negative GI ROS, Neg liver ROS, GERD-  Medicated and Controlled,  Endo/Other  negative endocrine ROSParathyroid tumor with hypercalcemia  Renal/GU negative Renal ROS  negative genitourinary   Musculoskeletal  (+) Arthritis -, Osteoarthritis,  Fibromyalgia -  Abdominal   Peds  Hematology negative hematology ROS (+)   Anesthesia Other Findings   Reproductive/Obstetrics negative OB ROS                            Anesthesia Physical Anesthesia Plan  ASA: III  Anesthesia Plan: General   Post-op Pain Management:    Induction: Intravenous  Airway Management Planned: Oral ETT  Additional Equipment:   Intra-op Plan:   Post-operative Plan: Extubation in OR  Informed Consent: I have reviewed the patients History and Physical, chart, labs and discussed the procedure including the risks, benefits and alternatives for the proposed anesthesia with the patient or authorized representative who has indicated his/her understanding and acceptance.   Dental Advisory Given  Plan Discussed with: CRNA and Surgeon  Anesthesia Plan Comments:        Anesthesia Quick Evaluation

## 2014-06-29 NOTE — Op Note (Signed)
Pre-operative diagnosis- Osteoarthritis Left knee(s)  Post-operative diagnosis- Osteoarthritis  Left knee(s)  Procedure-   Left Total Knee Arthroplasty (Attune system)  Surgeon- Dione Plover. Janiqua Friscia, MD  Assistant- Ardeen Jourdain, PA-C   Anesthesia-  General   EBL- * No blood loss amount entered *   Drains Hemovac   Tourniquet time  Total Tourniquet Time Documented: Thigh (Left) - 50 minutes Total: Thigh (Left) - 50 minutes    Complications- None  Condition-PACU - hemodynamically stable.   Brief Clinical Note  ADIYA SELMER is a 63 y.o. year old female with end stage OA of her left knee with progressively worsening pain and dysfunction. She has constant pain, with activity and at rest and significant functional deficits with difficulties even with ADLs. She has had extensive non-op management including analgesics, injections of cortisone and viscosupplements, and home exercise program, but remains in significant pain with significant dysfunction. Radiographs show bone on bone arthritis lateral and patellofemoral with severe valgus deformity. She presents now for left Total Knee Arthroplasty.    Procedure in detail---       The patient is brought into the operating room and positioned supine on the operating table. After successful administration of General anesthetic, a tourniquet is placed high on the Left thigh(s) and the lower extremity is prepped and draped in the usual sterile fashion. Time out is performed by the operating team and then the Left  lower extremity is wrapped in Esmarch, knee flexed and the tourniquet inflated to 300 mmHg.       A midline incision is made with a ten blade through the subcutaneous tissue to the level of the extensor mechanism. A fresh blade is used to make a lateral parapatellar arthrotomy due to the patients' valgus deformity. Soft tissue over the proximal lateral tibia is subperiosteally elevated to the joint line with a knife to the posterolateral  corner but not including the structures of the posterolateral corner. Soft tissue over the proximal medial tibia is elevated with attention being paid to avoiding the patellar tendon on the tibial tubercle. The patella is everted medially, knee flexed 90 degrees and the ACL and PCL are removed. Findings are bone on bone lateral and patellofemoral with massive global osteophytes. .       The drill is used to create a starting hole in the distal femur and the canal is thoroughly irrigated with sterile saline to remove the fatty contents. The 5 degree Left  valgus alignment guide is placed into the femoral canal and the distal femoral cutting block is pinned to remove 10  mm off the distal femur. Resection is made with an oscillating saw.      The tibia is subluxed forward and the menisci are removed. The extramedullary alignment guide is placed referencing proximally at the medial aspect of the tibial tubercle and distally along the second metatarsal axis and tibial crest. The block is pinned to remove 82mm off the more deficient lateral side. Resection is made with an oscillating saw. Size 7  is the most appropriate size for the tibia and the proximal tibia is prepared with the modular drill and keel punch for that size.      The femoral sizing guide is placed and size 7  is most appropriate. Rotation is marked off the epicondylar axis and confirmed by creating a rectangular flexion gap at 90 degrees. The size 7  cutting block is pinned in this rotation and the anterior, posterior and chamfer cuts are made with  the oscillating saw. The intercondylar block is then placed and that cut is made.      Trial size 7  tibial component, trial size 7  posterior stabilized femur and a 10  mm posterior stabilized rotating platform insert trial is placed. Full extension is achieved with excellent varus/valgus and   anterior/posterior balance throughout full range of motion. The patella is everted and thickness measured to be  24  mm. Free hand resection is taken to 14 mm, a 41 template is placed, lug holes are drilled, trial patella is placed, and it tracks normally. Osteophytes are removed off the posterior femur with the trial in place. All trials are removed and the cut bone surfaces prepared with pulsatile lavage. Cement is mixed and once ready for implantation, the size 7  tibial implant, size 7 posterior stabilized femoral component, and the size 41  patella are cemented in place and the patella is held with the clamp. The trial insert is placed and the knee held in full extension. The Exparel (20 ml mixed with 30 ml saline) and then 20 ml of .25% Bupivicaine is injected into the extensor mechanism, posterior capsule, medial and lateral gutters and subcutaneous tissues. All extruded cement is removed and once the cement is hard the permanent 10  mm posterior stabilized rotating platform insert is placed into the tibial tray.      The wound is copiously irrigated with saline solution and the tourniquet is released for a total   tourniquet time of 50  minutes. Bleeding is identified and controlled with electrocautery. The extensor mechanism is closed with interrupted #1 V-loc leaving open a small area from the superior to inferior pole of the patella to serve as a mini lateral release. Flexion against gravity is 140  degrees and the patella tracks normally. Subcutaneous tissue is closed with 2.0 vicryl and subcuticular with running 4.0 Monocryl.The incision is cleaned and dried and steri-strips and a bulky sterile dressing are applied. The limb is placed into a knee immobilizer and the patient is awakened and transported to recovery in stable condition.      Please note that a surgical assistant was a medical necessity for this procedure in order to perform it in a safe and expeditious manner. Surgical assistant was necessary to retract the ligaments and vital neurovascular structures to prevent injury to them and also necessary  for proper positioning of the limb to allow for anatomic placement of the prosthesis.    Dione Plover Herminia Warren, MD    06/29/2014, 10:34 AM

## 2014-06-29 NOTE — Anesthesia Postprocedure Evaluation (Signed)
  Anesthesia Post-op Note  Patient: Alexandra Gibson  Procedure(s) Performed: Procedure(s) (LRB): TOTAL KNEE ARTHROPLASTY LEFT  (Left)  Patient Location: PACU  Anesthesia Type: General  Level of Consciousness: awake and alert   Airway and Oxygen Therapy: Patient Spontanous Breathing  Post-op Pain: mild  Post-op Assessment: Post-op Vital signs reviewed, Patient's Cardiovascular Status Stable, Respiratory Function Stable, Patent Airway and No signs of Nausea or vomiting  Last Vitals:  Filed Vitals:   06/29/14 1410  BP: 141/85  Pulse: 67  Temp: 36.3 C  Resp: 14    Post-op Vital Signs: stable   Complications: No apparent anesthesia complications

## 2014-06-29 NOTE — Transfer of Care (Signed)
Immediate Anesthesia Transfer of Care Note  Patient: Alexandra Gibson  Procedure(s) Performed: Procedure(s): TOTAL KNEE ARTHROPLASTY LEFT  (Left)  Patient Location: PACU  Anesthesia Type:General  Level of Consciousness: awake, alert , oriented and patient cooperative  Airway & Oxygen Therapy: Patient Spontanous Breathing and Patient connected to face mask oxygen  Post-op Assessment: Report given to PACU RN, Post -op Vital signs reviewed and stable and Patient moving all extremities  Post vital signs: Reviewed and stable  Complications: No apparent anesthesia complications

## 2014-06-30 LAB — CBC
HCT: 35.6 % — ABNORMAL LOW (ref 36.0–46.0)
Hemoglobin: 11.5 g/dL — ABNORMAL LOW (ref 12.0–15.0)
MCH: 29.6 pg (ref 26.0–34.0)
MCHC: 32.3 g/dL (ref 30.0–36.0)
MCV: 91.8 fL (ref 78.0–100.0)
PLATELETS: 273 10*3/uL (ref 150–400)
RBC: 3.88 MIL/uL (ref 3.87–5.11)
RDW: 13.1 % (ref 11.5–15.5)
WBC: 15 10*3/uL — ABNORMAL HIGH (ref 4.0–10.5)

## 2014-06-30 LAB — BASIC METABOLIC PANEL
ANION GAP: 11 (ref 5–15)
BUN: 9 mg/dL (ref 6–23)
CALCIUM: 10.7 mg/dL — AB (ref 8.4–10.5)
CO2: 28 mEq/L (ref 19–32)
CREATININE: 0.73 mg/dL (ref 0.50–1.10)
Chloride: 102 mEq/L (ref 96–112)
GFR calc Af Amer: >90 mL/min (ref >90)
GFR calc non Af Amer: 88 mL/min — ABNORMAL LOW (ref >90)
GLUCOSE: 134 mg/dL — AB (ref 70–99)
Potassium: 4.2 mEq/L (ref 3.7–5.3)
Sodium: 141 mEq/L (ref 137–147)

## 2014-06-30 NOTE — Progress Notes (Signed)
Clinical Social Work Department CLINICAL SOCIAL WORK PLACEMENT NOTE 06/30/2014  Patient:  Alexandra Gibson, Alexandra Gibson  Account Number:  [id number] Admit date:  06/29/2014  Clinical Social Worker:  Werner Lean, LCSW  Date/time:  06/30/2014 11:06 AM  Clinical Social Work is seeking post-discharge placement for this patient at the following level of care:   SKILLED NURSING   (*CSW will update this form in Epic as items are completed)     Patient/family provided with [place] Department of Clinical Social Work's list of facilities offering this level of care within the geographic area requested by the patient (or if unable, by the patient's family).  06/30/2014  Patient/family informed of their freedom to choose among providers that offer the needed level of care, that participate in Medicare, Medicaid or managed care program needed by the patient, have an available bed and are willing to accept the patient.    Patient/family informed of [facility]' ownership interest in [facility], as well as of the fact that they are under no obligation to receive care at this facility.  PASARR submitted to EDS on 06/30/2014 PASARR number received on 06/30/2014  FL2 transmitted to all facilities in geographic area requested by pt/family on  06/30/2014 FL2 transmitted to all facilities within larger geographic area on   Patient informed that his/her managed care company has contracts with or will negotiate with  certain facilities, including the following:     Patient/family informed of bed offers received:  06/30/2014 Patient chooses bed at  Physician recommends and patient chooses bed at  [facility]  Patient to be transferred to  on   Patient to be transferred to facility by  Patient and family notified of transfer on  Name of family member notified:    The following physician request were entered in Epic:   Additional Comments:  Werner Lean LCSW [phone]

## 2014-06-30 NOTE — Progress Notes (Signed)
Clinical Social Work Department BRIEF PSYCHOSOCIAL ASSESSMENT 06/30/2014  Patient:  Alexandra Gibson, Alexandra Gibson     Account Number:  [id number]     Admit date:  06/29/2014  Clinical Social Worker:  Lacie Scotts  Date/Time:  06/30/2014 09:58 AM  Referred by:  Physician  Date Referred:  06/30/2014 Referred for  SNF Placement   Other Referral:   Interview type:  Patient Other interview type:    PSYCHOSOCIAL DATA Living Status:  HUSBAND Admitted from facility:   Level of care:   Primary support name:  Nilsa Nutting Primary support relationship to patient:  CHILD, ADULT Degree of support available:   supportive    CURRENT CONCERNS Current Concerns  Post-Acute Placement   Other Concerns:    SOCIAL WORK ASSESSMENT / PLAN Pt is a 63 yr female living at home prior to hospitalization. CSW met with pt  / spouse to assist with d/c planning. This is a planned admission. ST Rehab will be needed following hospital d/c. Pt has requested placement at [facility]. SNF contacted and is able to offer rehab bed. Pt has [place] insurance and medicare. [employer] does not have a SNF benefit. SNF will contact [place] and receive denial notice , then bill medicare for rehab. This has been reviewed with pt / spouse.   Assessment/plan status:  Psychosocial Support/Ongoing Assessment of Needs Other assessment/ plan:   Information/referral to community resources:   Insurance coverage for SNF and ambulance transport reviewed.    PATIENT'S/FAMILY'S RESPONSE TO PLAN OF CARE: Pt had little sleep last night. Mood is still bright and she is motivated to begin therapy. Pt is looking forward to having rehab at [facility].    Werner Lean LCSW [phone]

## 2014-06-30 NOTE — Progress Notes (Signed)
Utilization review completed.  

## 2014-06-30 NOTE — Progress Notes (Signed)
   Subjective: 1 Day Post-Op Procedure(s) (LRB): TOTAL KNEE ARTHROPLASTY LEFT  (Left) Patient reports pain as mild.   Patient seen in rounds with Dr. Wynelle Link. Family in room.  Rough night but better this morning. Patient is well, but has had some minor complaints of pain in the knee, requiring pain medications We will start therapy today.  Plan is to go [facility] after hospital stay.  Objective: Vital signs in last 24 hours: Temp:  [97.3 F (36.3 C)-99.2 F (37.3 C)] 99.2 F (37.3 C) (11/17 0500) Pulse Rate:  [58-92] 65 (11/17 0500) Resp:  [9-20] 15 (11/17 0800) BP: (118-173)/(64-129) 121/66 mmHg (11/17 0500) SpO2:  [96 %-100 %] 99 % (11/17 0800) Weight:  [87.544 kg (193 lb)] 87.544 kg (193 lb) (11/16 1218)  Intake/Output from previous day:  Intake/Output Summary (Last 24 hours) at 06/30/14 0844 Last data filed at 06/30/14 0641  Gross per 24 hour  Intake 4783.5 ml  Output   4005 ml  Net  778.5 ml    Intake/Output this shift: UOP 1600 since MN  Labs:  Recent Labs  06/30/14 0527  HGB 11.5*    Recent Labs  06/30/14 0527  WBC 15.0*  RBC 3.88  HCT 35.6*  PLT 273    Recent Labs  06/30/14 0527  NA 141  K 4.2  CL 102  CO2 28  BUN 9  CREATININE 0.73  GLUCOSE 134*  CALCIUM 10.7*   No results for input(s): LABPT, INR in the last 72 hours.  EXAM General - Patient is Alert, Appropriate and Oriented Extremity - Neurovascular intact Sensation intact distally Dorsiflexion/Plantar flexion intact Dressing - dressing C/D/I Motor Function - intact, moving foot and toes well on exam.  Hemovac pulled without difficulty.  Past Medical History  Diagnosis Date  . Hypertension   . Hyperlipidemia   . Parathyroid tumor   . Osteoarthritis of knee     bil-gets injections  . GERD (gastroesophageal reflux disease)   . Fibromyalgia     Assessment/Plan: 1 Day Post-Op Procedure(s) (LRB): TOTAL KNEE ARTHROPLASTY LEFT  (Left) Principal Problem:   OA  (osteoarthritis) of knee  Estimated body mass index is 28.92 kg/(m^2) as calculated from the following:   Height as of this encounter: 5' 8.5" (1.74 m).   Weight as of this encounter: 87.544 kg (193 lb). Advance diet Up with therapy Discharge to SNF  DVT Prophylaxis - Xarelto Weight-Bearing as tolerated to left leg D/C O2 and Pulse OX and try on Room Air  Arlee Muslim, PA-C Orthopaedic Surgery 06/30/2014, 8:44 AM

## 2014-06-30 NOTE — Progress Notes (Signed)
Physical Therapy Treatment Patient Details Name: Alexandra Gibson MRN: [id number] DOB: 01/07/1951 Today's Date: 06/30/2014    History of Present Illness LTKA    PT Comments    POD # 1 pm session.  Applied KI.  Assisted pt OOB to amb in hallway.  Assisted to BR to void.  Assisted with hygiene due to imapired balance.  Assisted back to bed to perform TKR TE's followed by ICE.  Follow Up Recommendations  SNF     Equipment Recommendations       Recommendations for Other Services       Precautions / Restrictions Precautions Precautions: Knee;Fall Precaution Comments: Instructed pt on KI use for amb  Required Braces or Orthoses: Knee Immobilizer - Left Knee Immobilizer - Left: Discontinue once straight leg raise with < 10 degree lag Restrictions Weight Bearing Restrictions: No    Mobility  Bed Mobility Overal bed mobility: Needs Assistance Bed Mobility: Sidelying to Sit;Supine to Sit   Sidelying to sit: Min assist Supine to sit: Min assist     General bed mobility comments: increased time and min assist to support L LE  Transfers Overall transfer level: Needs assistance Equipment used: Rolling walker (2 wheeled)   Sit to Stand: Mod assist;Min assist         General transfer comment: slow transition from sit to stand; cues for hand placement; assist to slide LLE forward when sitting  Ambulation/Gait Ambulation/Gait assistance: Min assist;Mod assist Ambulation Distance (Feet): 25 Feet Assistive device: Rolling walker (2 wheeled) Gait Pattern/deviations: Step-to pattern;Decreased stance time - left Gait velocity: decreased   General Gait Details: increased time and 50% VC's on proper sequencing and proper walker to self distance.  Very unsteady gait. distracted impaired safety cognition.  HIGH FALL RISK.   Stairs            Wheelchair Mobility    Modified Rankin (Stroke Patients Only)       Balance                                     Cognition                            Exercises   Total Knee Replacement TE's 10 reps B LE ankle pumps 10 reps towel squeezes 10 reps knee presses 10 reps heel slides  10 reps SAQ's 10 reps SLR's 10 reps ABD Followed by ICE     General Comments        Pertinent Vitals/Pain Pain Assessment: 0-10 Pain Score: 8  Pain Location: L knee during gait Pain Descriptors / Indicators: Constant;Burning;Sore Pain Intervention(s): Monitored during session;Premedicated before session;Repositioned;Ice applied    Home Living                      Prior Function            PT Goals (current goals can now be found in the care plan section) Progress towards PT goals: Progressing toward goals    Frequency  7X/week    PT Plan      Co-evaluation             End of Session Equipment Utilized During Treatment: Left knee immobilizer Activity Tolerance: Patient limited by pain;Patient limited by fatigue Patient left: in bed;with call bell/phone within reach;with family/visitor present     Time: 1345-1410 PT  Time Calculation (min) (ACUTE ONLY): 25 min  Charges:  $Gait Training: 8-22 mins $Therapeutic Activity: 8-22 mins                    G Codes:      Rica Koyanagi  PTA [facility]  Acute  Rehab Pager      [phone]

## 2014-06-30 NOTE — Plan of Care (Signed)
Problem: Phase I Progression Outcomes Goal: Pain controlled with appropriate interventions Outcome: Completed/Met Date Met:  06/30/14 Goal: Initial discharge plan identified Outcome: Completed/Met Date Met:  06/30/14 Goal: Other Phase I Outcomes/Goals Outcome: Not Applicable Date Met:  75/19/82  Problem: Phase II Progression Outcomes Goal: Ambulates Outcome: Completed/Met Date Met:  06/30/14 Goal: Discharge plan established Outcome: Completed/Met Date Met:  06/30/14 Goal: Other Phase II Outcomes/Goals Outcome: Not Applicable Date Met:  42/99/80

## 2014-06-30 NOTE — Discharge Instructions (Addendum)
° °Dr. Frank Aluisio °Total Joint Specialist °[facility] °[address] °[place], [place] [address] °[phone] ° °TOTAL KNEE REPLACEMENT POSTOPERATIVE DIRECTIONS ° ° ° °Knee Rehabilitation, Guidelines Following Surgery  °Results after knee surgery are often greatly improved when you follow the exercise, range of motion and muscle strengthening exercises prescribed by your doctor. Safety measures are also important to protect the knee from further injury. Any time any of these exercises cause you to have increased pain or swelling in your knee joint, decrease the amount until you are comfortable again and slowly increase them. If you have problems or questions, call your caregiver or physical therapist for advice.  ° °HOME CARE INSTRUCTIONS  °Remove items at home which could result in a fall. This includes throw rugs or furniture in walking pathways.  °Continue medications as instructed at time of discharge. °You may have some home medications which will be placed on hold until you complete the course of blood thinner medication.  °You may start showering once you are discharged home but do not submerge the incision under water. Just pat the incision dry and apply a dry gauze dressing on daily. °Walk with walker as instructed.  °You may resume a sexual relationship in one month or when given the OK by  your doctor.  °· Use walker as long as suggested by your caregivers. °· Avoid periods of inactivity such as sitting longer than an hour when not asleep. This helps prevent blood clots.  °You may put full weight on your legs and walk as much as is comfortable.  °You may return to work once you are cleared by your doctor.  °Do not drive a car for 6 weeks or until released by you surgeon.  °· Do not drive while taking narcotics.  °Wear the elastic stockings for three weeks following surgery during the day but you may remove then at night. °Make sure you keep all of your appointments after your  operation with all of your doctors and caregivers. You should call the office at the above phone number and make an appointment for approximately two weeks after the date of your surgery. °Change the dressing daily and reapply a dry dressing each time. °Please pick up a stool softener and laxative for home use as long as you are requiring pain medications. °· ICE to the affected knee every three hours for 30 minutes at a time and then as needed for pain and swelling.  Continue to use ice on the knee for pain and swelling from surgery. You may notice swelling that will progress down to the foot and ankle.  This is normal after surgery.  Elevate the leg when you are not up walking on it.   °It is important for you to complete the blood thinner medication as prescribed by your doctor. °· Continue to use the breathing machine which will help keep your temperature down.  It is common for your temperature to cycle up and down following surgery, especially at night when you are not up moving around and exerting yourself.  The breathing machine keeps your lungs expanded and your temperature down. ° °RANGE OF MOTION AND STRENGTHENING EXERCISES  °Rehabilitation of the knee is important following a knee injury or an operation. After just a few days of immobilization, the muscles of the thigh which control the knee become weakened and shrink (atrophy). Knee exercises are designed to build up the tone and strength of the thigh muscles and to improve knee   motion. Often times heat used for twenty to thirty minutes before working out will loosen up your tissues and help with improving the range of motion but do not use heat for the first two weeks following surgery. These exercises can be done on a training (exercise) mat, on the floor, on a table or on a bed. Use what ever works the best and is most comfortable for you Knee exercises include:  Leg Lifts - While your knee is still immobilized in a splint or cast, you can do  straight leg raises. Lift the leg to 60 degrees, hold for 3 sec, and slowly lower the leg. Repeat 10-20 times 2-3 times daily. Perform this exercise against resistance later as your knee gets better.  Quad and Hamstring Sets - Tighten up the muscle on the front of the thigh (Quad) and hold for 5-10 sec. Repeat this 10-20 times hourly. Hamstring sets are done by pushing the foot backward against an object and holding for 5-10 sec. Repeat as with quad sets.  A rehabilitation program following serious knee injuries can speed recovery and prevent re-injury in the future due to weakened muscles. Contact your doctor or a physical therapist for more information on knee rehabilitation.   SKILLED REHAB INSTRUCTIONS: If the patient is transferred to a skilled rehab facility following release from the hospital, a list of the current medications will be sent to the facility for the patient to continue.  When discharged from the skilled rehab facility, please have the facility set up the patient's [place] prior to being released. Also, the skilled facility will be responsible for providing the patient with their medications at time of release from the facility to include their pain medication, the muscle relaxants, and their blood thinner medication. If the patient is still at the rehab facility at time of the two week follow up appointment, the skilled rehab facility will also need to assist the patient in arranging follow up appointment in our office and any transportation needs.  MAKE SURE YOU:  Understand these instructions.  Will watch your condition.  Will get help right away if you are not doing well or get worse.    Pick up stool softner and laxative for home. Do not submerge incision under water. May shower. Continue to use ice for pain and swelling from surgery.      Information on my medicine - XARELTO (Rivaroxaban)  This medication education was reviewed with me or my  healthcare representative as part of my discharge preparation.  The pharmacist that spoke with me during my hospital stay was:  Minda Ditto, [facility]  Why was Xarelto prescribed for you? Xarelto was prescribed for you to reduce the risk of blood clots forming after orthopedic surgery. The medical term for these abnormal blood clots is venous thromboembolism (VTE).  What do you need to know about xarelto ? Take your Xarelto ONCE DAILY at the same time every day. You may take it either with or without food.  If you have difficulty swallowing the tablet whole, you may crush it and mix in applesauce just prior to taking your dose.  Take Xarelto exactly as prescribed by your doctor and DO NOT stop taking Xarelto without talking to the doctor who prescribed the medication.  Stopping without other VTE prevention medication to take the place of Xarelto may increase your risk of developing a clot.  After discharge, you should have regular check-up appointments with your healthcare provider that is prescribing  your Xarelto.    What do you do if you miss a dose? If you miss a dose, take it as soon as you remember on the same day then continue your regularly scheduled once daily regimen the next day. Do not take two doses of Xarelto on the same day.   Important Safety Information A possible side effect of Xarelto is bleeding. You should call your healthcare provider right away if you experience any of the following: ? Bleeding from an injury or your nose that does not stop. ? Unusual colored urine (red or dark brown) or unusual colored stools (red or black). ? Unusual bruising for unknown reasons. ? A serious fall or if you hit your head (even if there is no bleeding).  Some medicines may interact with Xarelto and might increase your risk of bleeding while on Xarelto. To help avoid this, consult your healthcare provider or pharmacist prior to using any new prescription or non-prescription  medications, including herbals, vitamins, non-steroidal anti-inflammatory drugs (NSAIDs) and supplements.  This website has more information on Xarelto: [email or web address]  Recommend hold Mobic till done with Xarelto     Dr. Gaynelle Arabian Total Joint Specialist [facility] [address], [place], [place] [address] [phone]  TOTAL KNEE REPLACEMENT POSTOPERATIVE DIRECTIONS    Knee Rehabilitation, Guidelines Following Surgery  Results after knee surgery are often greatly improved when you follow the exercise, range of motion and muscle strengthening exercises prescribed by your doctor. Safety measures are also important to protect the knee from further injury. Any time any of these exercises cause you to have increased pain or swelling in your knee joint, decrease the amount until you are comfortable again and slowly increase them. If you have problems or questions, call your caregiver or physical therapist for advice.   HOME CARE INSTRUCTIONS  Remove items at home which could result in a fall. This includes throw rugs or furniture in walking pathways.  Continue medications as instructed at time of discharge. You may have some home medications which will be placed on hold until you complete the course of blood thinner medication.  You may start showering once you are discharged home but do not submerge the incision under water. Just pat the incision dry and apply a dry gauze dressing on daily. Walk with walker as instructed.  You may resume a sexual relationship in one month or when given the OK by  your doctor.   Use walker as long as suggested by your caregivers.  Avoid periods of inactivity such as sitting longer than an hour when not asleep. This helps prevent blood clots.  You may put full weight on your legs and walk as much as is comfortable.  You may return to work once you are cleared by your doctor.  Do not drive a car for 6 weeks or until released  by you surgeon.   Do not drive while taking narcotics.  Wear the elastic stockings for three weeks following surgery during the day but you may remove then at night. Make sure you keep all of your appointments after your operation with all of your doctors and caregivers. You should call the office at the above phone number and make an appointment for approximately two weeks after the date of your surgery. Change the dressing daily and reapply a dry dressing each time. Please pick up a stool softener and laxative for home use as long as you are requiring pain medications.  ICE to the affected  knee every three hours for 30 minutes at a time and then as needed for pain and swelling.  Continue to use ice on the knee for pain and swelling from surgery. You may notice swelling that will progress down to the foot and ankle.  This is normal after surgery.  Elevate the leg when you are not up walking on it.   It is important for you to complete the blood thinner medication as prescribed by your doctor.  Continue to use the breathing machine which will help keep your temperature down.  It is common for your temperature to cycle up and down following surgery, especially at night when you are not up moving around and exerting yourself.  The breathing machine keeps your lungs expanded and your temperature down.  RANGE OF MOTION AND STRENGTHENING EXERCISES  Rehabilitation of the knee is important following a knee injury or an operation. After just a few days of immobilization, the muscles of the thigh which control the knee become weakened and shrink (atrophy). Knee exercises are designed to build up the tone and strength of the thigh muscles and to improve knee motion. Often times heat used for twenty to thirty minutes before working out will loosen up your tissues and help with improving the range of motion but do not use heat for the first two weeks following surgery. These exercises can be done on a training  (exercise) mat, on the floor, on a table or on a bed. Use what ever works the best and is most comfortable for you Knee exercises include:  Leg Lifts - While your knee is still immobilized in a splint or cast, you can do straight leg raises. Lift the leg to 60 degrees, hold for 3 sec, and slowly lower the leg. Repeat 10-20 times 2-3 times daily. Perform this exercise against resistance later as your knee gets better.  Quad and Hamstring Sets - Tighten up the muscle on the front of the thigh (Quad) and hold for 5-10 sec. Repeat this 10-20 times hourly. Hamstring sets are done by pushing the foot backward against an object and holding for 5-10 sec. Repeat as with quad sets.  A rehabilitation program following serious knee injuries can speed recovery and prevent re-injury in the future due to weakened muscles. Contact your doctor or a physical therapist for more information on knee rehabilitation.   SKILLED REHAB INSTRUCTIONS: If the patient is transferred to a skilled rehab facility following release from the hospital, a list of the current medications will be sent to the facility for the patient to continue.  When discharged from the skilled rehab facility, please have the facility set up the patient's [place] prior to being released. Also, the skilled facility will be responsible for providing the patient with their medications at time of release from the facility to include their pain medication, the muscle relaxants, and their blood thinner medication. If the patient is still at the rehab facility at time of the two week follow up appointment, the skilled rehab facility will also need to assist the patient in arranging follow up appointment in our office and any transportation needs.  MAKE SURE YOU:  Understand these instructions.  Will watch your condition.  Will get help right away if you are not doing well or get worse.    Pick up stool softner and laxative for home. Do not  submerge incision under water. May shower. Continue to use ice for pain and swelling from surgery.  Take Xarelto for two and a half more weeks, then discontinue Xarelto. Once the patient has completed the blood thinner regimen, then take a Baby 81 mg Aspirin daily for three more weeks.  When discharged from the skilled rehab facility, please have the facility set up the patient's [place] prior to being released.   Also provide the patient with their medications at time of release from the facility to include their pain medication, the muscle relaxants, and their blood thinner medication.  If the patient is still at the rehab facility at time of follow up appointment, please also assist the patient in arranging follow up appointment in our office and any transportation needs. ICE to the affected knee or hip every three hours for 30 minutes at a time and then as needed for pain and swelling.   Postoperative Constipation Protocol  Constipation - defined medically as fewer than three stools per week and severe constipation as less than one stool per week.  One of the most common issues patients have following surgery is constipation.  Even if you have a regular bowel pattern at home, your normal regimen is likely to be disrupted due to multiple reasons following surgery.  Combination of anesthesia, postoperative narcotics, change in appetite and fluid intake all can affect your bowels.  In order to avoid complications following surgery, here are some recommendations in order to help you during your recovery period.  Colace (docusate) - Pick up an over-the-counter form of Colace or another stool softener and take twice a day as long as you are requiring postoperative pain medications.  Take with a full glass of water daily.  If you experience loose stools or diarrhea, hold the colace until you stool forms back up.  If your symptoms do not get better within 1 week or if they get  worse, check with your doctor.  Dulcolax (bisacodyl) - Pick up over-the-counter and take as directed by the product packaging as needed to assist with the movement of your bowels.  Take with a full glass of water.  Use this product as needed if not relieved by Colace only.   MiraLax (polyethylene glycol) - Pick up over-the-counter to have on hand.  MiraLax is a solution that will increase the amount of water in your bowels to assist with bowel movements.  Take as directed and can mix with a glass of water, juice, soda, coffee, or tea.  Take if you go more than two days without a movement. Do not use MiraLax more than once per day. Call your doctor if you are still constipated or irregular after using this medication for 7 days in a row.  If you continue to have problems with postoperative constipation, please contact the office for further assistance and recommendations.  If you experience "the worst abdominal pain ever" or develop nausea or vomiting, please contact the office immediatly for further recommendations for treatment.

## 2014-06-30 NOTE — Care Management Note (Signed)
    Page 1 of 1   06/30/2014     9:11:40 AM CARE MANAGEMENT NOTE 06/30/2014  Patient:  SABLE, KNOLES   Account Number:  [id number]  Date Initiated:  06/30/2014  Documentation initiated by:  [facility]  Subjective/Objective Assessment:   adm: TOTAL KNEE ARTHROPLASTY LEFT  (Left)     Action/Plan:   SNF for rehab   Anticipated DC Date:  06/30/2014   Anticipated DC Plan:  SKILLED NURSING FACILITY         Choice offered to / List presented to:             Status of service:  Completed, signed off Medicare Important Message given?   (If response is "NO", the following Medicare IM given date fields will be blank) Date Medicare IM given:   Medicare IM given by:   Date Additional Medicare IM given:   Additional Medicare IM given by:    Discharge Disposition:  [place]  Per UR Regulation:    If discussed at Long Length of Stay Meetings, dates discussed:    Comments:  06/30/14 07:10 CM notes pt to go to SNF for rehab; CSW arranging.  No other CM needs were communicated.  Mariane Masters, BSN, CM [phone].

## 2014-06-30 NOTE — Evaluation (Signed)
Occupational Therapy Evaluation Patient Details Name: Alexandra Gibson MRN: [id number] DOB: 23-Oct-1950 Today's Date: 06/30/2014    History of Present Illness LTKA   Clinical Impression   This 63 year old female was admitted for L TKA. She will benefit from skilled OT to increase safety and independence with adls.  Pt was mod I prior to admission and now needs overall mod A. Goals in acute are for min A.    Follow Up Recommendations  SNF    Equipment Recommendations  3 in 1 bedside comode    Recommendations for Other Services       Precautions / Restrictions Precautions Precautions: Knee;Fall Required Braces or Orthoses: Knee Immobilizer - Left Knee Immobilizer - Left: Discontinue once straight leg raise with < 10 degree lag Restrictions Weight Bearing Restrictions: No      Mobility Bed Mobility         Supine to sit: Min assist;HOB elevated     General bed mobility comments: cues for technique and assistance for LLE  Transfers   Equipment used: Rolling walker (2 wheeled) Transfers: Sit to/from Stand Sit to Stand: Mod assist;From elevated surface         General transfer comment: slow transition from sit to stand; cues for hand placement; assist to slide LLE forward when sitting    Balance                                            ADL Overall ADL's : Needs assistance/impaired     Grooming: Wash/dry hands;Wash/dry face;Sitting;Set up   Upper Body Bathing: Set up;Sitting   Lower Body Bathing: Moderate assistance;Sit to/from stand   Upper Body Dressing : Set up;Sitting   Lower Body Dressing: Moderate assistance;Sit to/from stand   Toilet Transfer: Moderate assistance;Stand-pivot (to recliner, mod for sit to stand; min A for steps)             General ADL Comments: performed ADL from eob then transferred to recliner.  Educated on AE but pt did not try today.  Pt is unable to lift LLE--painful and she is not ready to cross RLE  under ankle yet.  Educated on [place]      Pertinent Vitals/Pain Pain Score: 8  Pain Location: L knee when standing Pain Descriptors / Indicators: Burning Pain Intervention(s): Limited activity within patient's tolerance;Monitored during session;Premedicated before session;Repositioned;Ice applied     Hand Dominance     Extremity/Trunk Assessment Upper Extremity Assessment Upper Extremity Assessment: Overall WFL for tasks assessed           Communication Communication Communication: No difficulties   Cognition Arousal/Alertness: Awake/alert Behavior During Therapy: WFL for tasks assessed/performed Overall Cognitive Status: Within Functional Limits for tasks assessed                     General Comments       Exercises       Shoulder Instructions      Home Living Family/patient expects to be discharged to:: Skilled nursing facility  Prior Functioning/Environment Level of Independence: Independent with assistive device(s)        Comments: needed cane for walking    OT Diagnosis: Generalized weakness   OT Problem List: Decreased strength;Decreased activity tolerance;Decreased knowledge of use of DME or AE;Pain   OT Treatment/Interventions: Self-care/ADL training;DME and/or AE instruction;Patient/family education    OT Goals(Current goals can be found in the care plan section) Acute Rehab OT Goals Patient Stated Goal: to not have pain and to get back to gardening OT Goal Formulation: With patient Time For Goal Achievement: 07/07/14 Potential to Achieve Goals: Good ADL Goals Pt Will Perform Grooming: with supervision;standing Pt Will Perform Lower Body Bathing: with min assist;with adaptive equipment;sit to/from stand Pt Will Transfer to Toilet: with min assist;ambulating;bedside commode Pt Will Perform Toileting - Clothing  Manipulation and hygiene: with min assist  OT Frequency: Min 2X/week   Barriers to D/C:            Co-evaluation              End of Session    Activity Tolerance: Patient tolerated treatment well Patient left: in chair;with call bell/phone within reach;with family/visitor present   Time: 0912-0947 OT Time Calculation (min): 35 min Charges:  OT General Charges $OT Visit: 1 Procedure OT Evaluation $Initial OT Evaluation Tier I: 1 Procedure OT Treatments $Self Care/Home Management : 23-37 mins G-Codes:    Kamisha Ell 2014/07/11, 10:27 AM Lesle Chris, OTR/L [phone] 2014-07-11

## 2014-06-30 NOTE — Progress Notes (Signed)
Physical Therapy Treatment Patient Details Name: Alexandra Gibson MRN: [id number] DOB: 1950-08-22 Today's Date: 06/30/2014    History of Present Illness LTKA    PT Comments    POD # 1 am session.  Pt OOB in recliner.  Applied KI and instructed on use for amb.  Assisted pt out of recliner to amb limited distance in hallway. Limited by pain and fatigue.  Returned to room to perform TE's however pt was unable to tolerate all.  Follow Up Recommendations  SNF     Equipment Recommendations       Recommendations for Other Services       Precautions / Restrictions Precautions Precautions: Knee;Fall Precaution Comments: Instructed pt on KI use for amb  Required Braces or Orthoses: Knee Immobilizer - Left Knee Immobilizer - Left: Discontinue once straight leg raise with < 10 degree lag Restrictions Weight Bearing Restrictions: No    Mobility  Bed Mobility         Supine to sit: Min assist;HOB elevated     General bed mobility comments: Pt OOB in recliner  Transfers Overall transfer level: Needs assistance Equipment used: Rolling walker (2 wheeled) Transfers: Sit to/from Stand Sit to Stand: Mod assist;Min assist         General transfer comment: slow transition from sit to stand; cues for hand placement; assist to slide LLE forward when sitting  Ambulation/Gait Ambulation/Gait assistance: Min assist;Mod assist;+2 safety/equipment Ambulation Distance (Feet): 20 Feet Assistive device: Rolling walker (2 wheeled) Gait Pattern/deviations: Step-to pattern;Decreased stance time - left;Trunk flexed Gait velocity: decreased   General Gait Details: increased time and 50% VC's on proper sequencing and proper walker to self distance.     Stairs            Wheelchair Mobility    Modified Rankin (Stroke Patients Only)       Balance                                    Cognition Arousal/Alertness: Awake/alert Behavior During Therapy: WFL for tasks  assessed/performed Overall Cognitive Status: Within Functional Limits for tasks assessed                      Exercises      General Comments        Pertinent Vitals/Pain Pain Assessment: 0-10 Pain Score: 8  Pain Location: L knee during gait Pain Descriptors / Indicators: Constant;Burning;Sore Pain Intervention(s): Monitored during session;Premedicated before session;Repositioned;Ice applied    Home Living Family/patient expects to be discharged to:: Skilled nursing facility                    Prior Function Level of Independence: Independent with assistive device(s)      Comments: needed cane for walking   PT Goals (current goals can now be found in the care plan section) Acute Rehab PT Goals Patient Stated Goal: to not have pain Progress towards PT goals: Progressing toward goals    Frequency  7X/week    PT Plan      Co-evaluation             End of Session Equipment Utilized During Treatment: Left knee immobilizer Activity Tolerance: Patient limited by pain;Patient limited by fatigue Patient left: in chair;with call bell/phone within reach;with family/visitor present     Time: 1020-1046 PT Time Calculation (min) (ACUTE ONLY): 26 min  Charges:  $Gait  Training: 8-22 mins $Therapeutic Activity: 8-22 mins                    G Codes:      Rica Koyanagi  PTA [facility]  Acute  Rehab Pager      [phone]

## 2014-06-30 NOTE — Plan of Care (Signed)
Problem: Phase I Progression Outcomes Goal: Pain controlled with appropriate interventions Outcome: Progressing Goal: Dangle or out of bed evening of surgery Outcome: Completed/Met Date Met:  06/29/14 Goal: Hemodynamically stable Outcome: Completed/Met Date Met:  06/29/14  Problem: Phase II Progression Outcomes Goal: Tolerating diet Outcome: Completed/Met Date Met:  06/30/14

## 2014-07-01 LAB — BASIC METABOLIC PANEL
Anion gap: 10 (ref 5–15)
BUN: 12 mg/dL (ref 6–23)
CO2: 30 mEq/L (ref 19–32)
CREATININE: 0.74 mg/dL (ref 0.50–1.10)
Calcium: 10.8 mg/dL — ABNORMAL HIGH (ref 8.4–10.5)
Chloride: 101 mEq/L (ref 96–112)
GFR, EST NON AFRICAN AMERICAN: 88 mL/min — AB (ref >90)
Glucose, Bld: 119 mg/dL — ABNORMAL HIGH (ref 70–99)
Potassium: 4.1 mEq/L (ref 3.7–5.3)
Sodium: 141 mEq/L (ref 137–147)

## 2014-07-01 LAB — CBC
HCT: 36.6 % (ref 36.0–46.0)
Hemoglobin: 11.6 g/dL — ABNORMAL LOW (ref 12.0–15.0)
MCH: 29.7 pg (ref 26.0–34.0)
MCHC: 31.7 g/dL (ref 30.0–36.0)
MCV: 93.6 fL (ref 78.0–100.0)
Platelets: 290 10*3/uL (ref 150–400)
RBC: 3.91 MIL/uL (ref 3.87–5.11)
RDW: 13.3 % (ref 11.5–15.5)
WBC: 18.4 10*3/uL — ABNORMAL HIGH (ref 4.0–10.5)

## 2014-07-01 MED ORDER — RIVAROXABAN 10 MG PO TABS: 10.0000 mg | ORAL_TABLET | Freq: Every day | ORAL | Status: DC

## 2014-07-01 MED ORDER — METHOCARBAMOL 500 MG PO TABS: 500.0000 mg | ORAL_TABLET | Freq: Four times a day (QID) | ORAL | Status: DC | PRN

## 2014-07-01 MED ORDER — BISACODYL 10 MG RE SUPP: 10.0000 mg | Freq: Every day | RECTAL | Status: DC | PRN

## 2014-07-01 MED ORDER — ACETAMINOPHEN 325 MG PO TABS: 650.0000 mg | ORAL_TABLET | Freq: Four times a day (QID) | ORAL | Status: DC | PRN

## 2014-07-01 MED ORDER — TRAMADOL HCL 50 MG PO TABS: 50.0000 mg | ORAL_TABLET | Freq: Four times a day (QID) | ORAL | Status: DC | PRN

## 2014-07-01 MED ORDER — METOCLOPRAMIDE HCL 5 MG PO TABS: 5.0000 mg | ORAL_TABLET | Freq: Three times a day (TID) | ORAL | Status: DC | PRN

## 2014-07-01 MED ORDER — OXYCODONE HCL 5 MG PO TABS: 5.0000 mg | ORAL_TABLET | ORAL | Status: DC | PRN

## 2014-07-01 MED ORDER — DSS 100 MG PO CAPS: 100.0000 mg | ORAL_CAPSULE | Freq: Two times a day (BID) | ORAL | Status: DC

## 2014-07-01 MED ORDER — POLYETHYLENE GLYCOL 3350 17 G PO PACK: 17.0000 g | PACK | Freq: Every day | ORAL | Status: DC | PRN

## 2014-07-01 MED ORDER — ONDANSETRON HCL 4 MG PO TABS: 4.0000 mg | ORAL_TABLET | Freq: Four times a day (QID) | ORAL | Status: DC | PRN

## 2014-07-01 NOTE — Progress Notes (Signed)
Physical Therapy Treatment Patient Details Name: MILYN STAPLETON MRN: [id number] DOB: [birth year] Today's Date: 07/01/2014    History of Present Illness LTKA    PT Comments    pATIENT TOLERATED MUCH BETTER TODAY, AMB. X 175'.   Follow Up Recommendations  SNF     Equipment Recommendations       Recommendations for Other Services       Precautions / Restrictions Precautions Precautions: Knee;Fall Precaution Comments: Instructed pt on KI use for amb  Required Braces or Orthoses: Knee Immobilizer - Left Knee Immobilizer - Left: Discontinue once straight leg raise with < 10 degree lag    Mobility  Bed Mobility         Supine to sit: Min assist     General bed mobility comments: increased time and min assist to support L LE  Transfers   Equipment used: Rolling walker (2 wheeled) Transfers: Sit to/from Stand Sit to Stand: Min assist         General transfer comment: from raised bed, cues fopr hanc=d placement.  Ambulation/Gait Ambulation/Gait assistance: Min assist Ambulation Distance (Feet): 175 Feet Assistive device: Rolling walker (2 wheeled) Gait Pattern/deviations: Step-to pattern;Antalgic;Decreased step length - left;Decreased stance time - left Gait velocity: decreased   General Gait Details: cues for roling walker, posture.   Stairs            Wheelchair Mobility    Modified Rankin (Stroke Patients Only)       Balance                                    Cognition Arousal/Alertness: Awake/alert                          Exercises Total Joint Exercises Ankle Circles/Pumps: AROM;Both;10 reps;Supine Quad Sets: AROM;Both;10 reps;Supine Towel Squeeze: AROM;Both;Supine;10 reps Heel Slides: AAROM;Left;10 reps;Supine Hip ABduction/ADduction: AAROM;Left;10 reps;Supine Straight Leg Raises: AAROM;Left;10 reps;Supine Goniometric ROM: 10-50    General Comments        Pertinent Vitals/Pain Pain Score: 4  Pain  Location: L knee Pain Descriptors / Indicators: Aching;Tightness Pain Intervention(s): Premedicated before session    Home Living                      Prior Function            PT Goals (current goals can now be found in the care plan section) Progress towards PT goals: Progressing toward goals    Frequency  7X/week    PT Plan Current plan remains appropriate    Co-evaluation             End of Session Equipment Utilized During Treatment: Left knee immobilizer Activity Tolerance: Patient tolerated treatment well Patient left: in chair;with call bell/phone within reach;with family/visitor present     Time: 8295-6213 PT Time Calculation (min) (ACUTE ONLY): 32 min  Charges:  $Gait Training: 8-22 mins $Therapeutic Exercise: 8-22 mins                    G Codes:      Claretha Cooper 07/01/2014, 3:43 PM

## 2014-07-01 NOTE — Progress Notes (Signed)
   Subjective: 2 Days Post-Op Procedure(s) (LRB): TOTAL KNEE ARTHROPLASTY LEFT  (Left) Patient reports pain as mild.   Patient seen in rounds with Dr. Wynelle Link.  Husband in room.  Still sore today. Sitting up in bed eating breakfast. Patient is well, but has had some minor complaints of pain in the knee, requiring pain medications Plan is to go Rehab after hospital stay.  Objective: Vital signs in last 24 hours: Temp:  [98.2 F (36.8 C)-99.2 F (37.3 C)] 99.2 F (37.3 C) (11/18 0634) Pulse Rate:  [63-93] 91 (11/18 0634) Resp:  [15-18] 16 (11/18 0634) BP: (109-145)/(70-90) 127/81 mmHg (11/18 0634) SpO2:  [92 %-99 %] 92 % (11/18 0634)  Intake/Output from previous day:  Intake/Output Summary (Last 24 hours) at 07/01/14 0715 Last data filed at 07/01/14 0406  Gross per 24 hour  Intake   2098 ml  Output   2400 ml  Net   -302 ml    Intake/Output this shift:    Labs:  Recent Labs  06/30/14 0527 07/01/14 0402  HGB 11.5* 11.6*    Recent Labs  06/30/14 0527 07/01/14 0402  WBC 15.0* 18.4*  RBC 3.88 3.91  HCT 35.6* 36.6  PLT 273 290    Recent Labs  06/30/14 0527 07/01/14 0402  NA 141 141  K 4.2 4.1  CL 102 101  CO2 28 30  BUN 9 12  CREATININE 0.73 0.74  GLUCOSE 134* 119*  CALCIUM 10.7* 10.8*   No results for input(s): LABPT, INR in the last 72 hours.  EXAM General - Patient is Alert, Appropriate and Oriented Extremity - Neurovascular intact Sensation intact distally Dressing/Incision - clean, dry, no drainage Motor Function - intact, moving foot and toes well on exam.   Past Medical History  Diagnosis Date  . Hypertension   . Hyperlipidemia   . Parathyroid tumor   . Osteoarthritis of knee     bil-gets injections  . GERD (gastroesophageal reflux disease)   . Fibromyalgia     Assessment/Plan: 2 Days Post-Op Procedure(s) (LRB): TOTAL KNEE ARTHROPLASTY LEFT  (Left) Principal Problem:   OA (osteoarthritis) of knee  Estimated body mass index is  28.92 kg/(m^2) as calculated from the following:   Height as of this encounter: 5' 8.5" (1.74 m).   Weight as of this encounter: 87.544 kg (193 lb). Up with therapy Discharge to SNF  DVT Prophylaxis - Xarelto Weight-Bearing as tolerated to left leg  Arlee Muslim, PA-C Orthopaedic Surgery 07/01/2014, 7:15 AM

## 2014-07-01 NOTE — Discharge Summary (Signed)
Physician Discharge Summary   Patient ID: Alexandra Gibson MRN: [id number] DOB/AGE: Apr 18, 1951 63 y.o.  Admit date: 06/29/2014 Discharge date: 07-01-2014  Primary Diagnosis:  Osteoarthritis Left knee(s) Admission Diagnoses:  Past Medical History  Diagnosis Date  . Hypertension   . Hyperlipidemia   . Parathyroid tumor   . Osteoarthritis of knee     bil-gets injections  . GERD (gastroesophageal reflux disease)   . Fibromyalgia    Discharge Diagnoses:   Principal Problem:   OA (osteoarthritis) of knee  Estimated body mass index is 28.92 kg/(m^2) as calculated from the following:   Height as of this encounter: 5' 8.5" (1.74 m).   Weight as of this encounter: 87.544 kg (193 lb).  Procedure:  Procedure(s) (LRB): TOTAL KNEE ARTHROPLASTY LEFT  (Left)   Consults: None  HPI: Alexandra Gibson is a 63 y.o. year old female with end stage OA of her left knee with progressively worsening pain and dysfunction. She has constant pain, with activity and at rest and significant functional deficits with difficulties even with ADLs. She has had extensive non-op management including analgesics, injections of cortisone and viscosupplements, and home exercise program, but remains in significant pain with significant dysfunction. Radiographs show bone on bone arthritis lateral and patellofemoral with severe valgus deformity. She presents now for left Total Knee Arthroplasty.   Laboratory Data: Admission on 06/29/2014  Component Date Value Ref Range Status  . ABO/RH(D) 06/29/2014 O POS   Final  . Antibody Screen 06/29/2014 NEG   Final  . Sample Expiration 06/29/2014 07/02/2014   Final  . ABO/RH(D) 06/29/2014 O POS   Final  . WBC 06/30/2014 15.0* 4.0 - 10.5 K/uL Final  . RBC 06/30/2014 3.88  3.87 - 5.11 MIL/uL Final  . Hemoglobin 06/30/2014 11.5* 12.0 - 15.0 g/dL Final  . HCT 06/30/2014 35.6* 36.0 - 46.0 % Final  . MCV 06/30/2014 91.8  78.0 - 100.0 fL Final  . MCH 06/30/2014 29.6  26.0 - 34.0 pg  Final  . MCHC 06/30/2014 32.3  30.0 - 36.0 g/dL Final  . RDW 06/30/2014 13.1  11.5 - 15.5 % Final  . Platelets 06/30/2014 273  150 - 400 K/uL Final  . Sodium 06/30/2014 141  137 - 147 mEq/L Final  . Potassium 06/30/2014 4.2  3.7 - 5.3 mEq/L Final  . Chloride 06/30/2014 102  96 - 112 mEq/L Final  . CO2 06/30/2014 28  19 - 32 mEq/L Final  . Glucose, Bld 06/30/2014 134* 70 - 99 mg/dL Final  . BUN 06/30/2014 9  6 - 23 mg/dL Final  . Creatinine, Ser 06/30/2014 0.73  0.50 - 1.10 mg/dL Final  . Calcium 06/30/2014 10.7* 8.4 - 10.5 mg/dL Final  . GFR calc non Af Amer 06/30/2014 88* >90 mL/min Final  . GFR calc Af Amer 06/30/2014 >90  >90 mL/min Final   Comment: (NOTE) The eGFR has been calculated using the CKD EPI equation. This calculation has not been validated in all clinical situations. eGFR's persistently <90 mL/min signify possible Chronic Kidney Disease.   . Anion gap 06/30/2014 11  5 - 15 Final  . WBC 07/01/2014 18.4* 4.0 - 10.5 K/uL Final  . RBC 07/01/2014 3.91  3.87 - 5.11 MIL/uL Final  . Hemoglobin 07/01/2014 11.6* 12.0 - 15.0 g/dL Final  . HCT 07/01/2014 36.6  36.0 - 46.0 % Final  . MCV 07/01/2014 93.6  78.0 - 100.0 fL Final  . MCH 07/01/2014 29.7  26.0 - 34.0 pg Final  . MCHC 07/01/2014  31.7  30.0 - 36.0 g/dL Final  . RDW 07/01/2014 13.3  11.5 - 15.5 % Final  . Platelets 07/01/2014 290  150 - 400 K/uL Final  . Sodium 07/01/2014 141  137 - 147 mEq/L Final  . Potassium 07/01/2014 4.1  3.7 - 5.3 mEq/L Final  . Chloride 07/01/2014 101  96 - 112 mEq/L Final  . CO2 07/01/2014 30  19 - 32 mEq/L Final  . Glucose, Bld 07/01/2014 119* 70 - 99 mg/dL Final  . BUN 07/01/2014 12  6 - 23 mg/dL Final  . Creatinine, Ser 07/01/2014 0.74  0.50 - 1.10 mg/dL Final  . Calcium 07/01/2014 10.8* 8.4 - 10.5 mg/dL Final  . GFR calc non Af Amer 07/01/2014 88* >90 mL/min Final  . GFR calc Af Amer 07/01/2014 >90  >90 mL/min Final   Comment: (NOTE) The eGFR has been calculated using the CKD EPI  equation. This calculation has not been validated in all clinical situations. eGFR's persistently <90 mL/min signify possible Chronic Kidney Disease.   [name] gap 07/01/2014 10  5 - 15 Final  Hospital Outpatient Visit on 06/22/2014  Component Date Value Ref Range Status  . aPTT 06/22/2014 30  24 - 37 seconds Final  . WBC 06/22/2014 8.3  4.0 - 10.5 K/uL Final  . RBC 06/22/2014 4.94  3.87 - 5.11 MIL/uL Final  . Hemoglobin 06/22/2014 14.5  12.0 - 15.0 g/dL Final  . HCT 06/22/2014 45.1  36.0 - 46.0 % Final  . MCV 06/22/2014 91.3  78.0 - 100.0 fL Final  . MCH 06/22/2014 29.4  26.0 - 34.0 pg Final  . MCHC 06/22/2014 32.2  30.0 - 36.0 g/dL Final  . RDW 06/22/2014 13.2  11.5 - 15.5 % Final  . Platelets 06/22/2014 306  150 - 400 K/uL Final  . Sodium 06/22/2014 142  137 - 147 mEq/L Final  . Potassium 06/22/2014 4.4  3.7 - 5.3 mEq/L Final  . Chloride 06/22/2014 103  96 - 112 mEq/L Final  . CO2 06/22/2014 28  19 - 32 mEq/L Final  . Glucose, Bld 06/22/2014 84  70 - 99 mg/dL Final  . BUN 06/22/2014 14  6 - 23 mg/dL Final  . Creatinine, Ser 06/22/2014 0.75  0.50 - 1.10 mg/dL Final  . Calcium 06/22/2014 11.0* 8.4 - 10.5 mg/dL Final  . Total Protein 06/22/2014 8.0  6.0 - 8.3 g/dL Final  . Albumin 06/22/2014 4.5  3.5 - 5.2 g/dL Final  . AST 06/22/2014 17  0 - 37 U/L Final   Comment: SLIGHT HEMOLYSIS HEMOLYSIS AT THIS LEVEL MAY AFFECT RESULT   . ALT 06/22/2014 12  0 - 35 U/L Final  . Alkaline Phosphatase 06/22/2014 109  39 - 117 U/L Final  . Total Bilirubin 06/22/2014 0.7  0.3 - 1.2 mg/dL Final  . GFR calc non Af Amer 06/22/2014 88* >90 mL/min Final  . GFR calc Af Amer 06/22/2014 >90  >90 mL/min Final   Comment: (NOTE) The eGFR has been calculated using the CKD EPI equation. This calculation has not been validated in all clinical situations. eGFR's persistently <90 mL/min signify possible Chronic Kidney Disease.   . Anion gap 06/22/2014 11  5 - 15 Final  . Prothrombin Time 06/22/2014 13.6   11.6 - 15.2 seconds Final  . INR 06/22/2014 1.03  0.00 - 1.49 Final  . Color, Urine 06/22/2014 YELLOW  YELLOW Final  . APPearance 06/22/2014 CLEAR  CLEAR Final  . Specific Gravity, Urine 06/22/2014 1.015  1.005 -  1.030 Final  . pH 06/22/2014 7.0  5.0 - 8.0 Final  . Glucose, UA 06/22/2014 NEGATIVE  NEGATIVE mg/dL Final  . Hgb urine dipstick 06/22/2014 NEGATIVE  NEGATIVE Final  . Bilirubin Urine 06/22/2014 NEGATIVE  NEGATIVE Final  . Ketones, ur 06/22/2014 NEGATIVE  NEGATIVE mg/dL Final  . Protein, ur 06/22/2014 NEGATIVE  NEGATIVE mg/dL Final  . Urobilinogen, UA 06/22/2014 0.2  0.0 - 1.0 mg/dL Final  . Nitrite 06/22/2014 NEGATIVE  NEGATIVE Final  . Leukocytes, UA 06/22/2014 NEGATIVE  NEGATIVE Final   MICROSCOPIC NOT DONE ON URINES WITH NEGATIVE PROTEIN, BLOOD, LEUKOCYTES, NITRITE, OR GLUCOSE <1000 mg/dL.  Marland Kitchen MRSA, PCR 06/22/2014 NEGATIVE  NEGATIVE Final  . Staphylococcus aureus 06/22/2014 NEGATIVE  NEGATIVE Final   Comment:        The Xpert SA Assay (FDA approved for NASAL specimens in patients over 35 years of age), is one component of a comprehensive surveillance program.  Test performance has been validated by [employer] for patients greater than or equal to 67 year old. It is not intended to diagnose infection nor to guide or monitor treatment.      X-Rays:Dg Chest 2 View  06/22/2014   CLINICAL DATA:  Preoperative exam prior left knee surgery; nonsmoker; history of hypertension  EXAM: CHEST  2 VIEW  COMPARISON:  Chest x-ray dated October 14, 2008  FINDINGS: The lungs are well-expanded and clear. The heart and pulmonary vascularity are within the limits of normal. There is no pleural effusion or pneumothorax. There is stable mid thoracic dextroscoliosis.  IMPRESSION: There is no active cardiopulmonary disease.   Electronically Signed   By: David  Martinique   On: 06/22/2014 10:35    EKG:No orders found for this or any previous visit.   Hospital Course: Ange L Swalley is a 63 y.o. who  was admitted to [facility]. They were brought to the operating room on 06/29/2014 and underwent Procedure(s): TOTAL KNEE ARTHROPLASTY LEFT .  Patient tolerated the procedure well and was later transferred to the recovery room and then to the orthopaedic floor for postoperative care.  They were given PO and IV analgesics for pain control following their surgery.  They were given 24 hours of postoperative antibiotics of  Anti-infectives    Start     Dose/Rate Route Frequency Ordered Stop   06/29/14 1500  ceFAZolin (ANCEF) IVPB 2 g/50 mL premix     2 g100 mL/hr over 30 Minutes Intravenous Every 6 hours 06/29/14 1225 06/29/14 2059   06/29/14 0643  ceFAZolin (ANCEF) IVPB 2 g/50 mL premix     2 g100 mL/hr over 30 Minutes Intravenous On call to O.R. 06/29/14 4562 06/29/14 0919     and started on DVT prophylaxis in the form of Xarelto.   PT and OT were ordered for total joint protocol. Social worker got involved to assist with placement of the patient.  Discharge planning consulted to help with postop disposition and equipment needs.  Patient had a rough night on the evening of surgery with pain.  They started to get up OOB with therapy on day one. Hemovac drain was pulled without difficulty.  Continued to work with therapy into day two.  Dressing was changed on day two and the incision was healing well. Patient was seen in rounds and was ready to go tot he SNF.   Diet: Regular diet Activity:WBAT Follow-up:in 2 weeks Disposition - Skilled nursing facility Discharged Condition: good       Discharge Instructions  Call MD / Call 911    Complete by:  As directed   If you experience chest pain or shortness of breath, CALL 911 and be transported to the hospital emergency room.  If you develope a fever above 101 F, pus (white drainage) or increased drainage or redness at the wound, or calf pain, call your surgeon's office.     Change dressing    Complete by:  As directed   Change dressing  daily with sterile 4 x 4 inch gauze dressing and apply TED hose. Do not submerge the incision under water.     Constipation Prevention    Complete by:  As directed   Drink plenty of fluids.  Prune juice may be helpful.  You may use a stool softener, such as Colace (over the counter) 100 mg twice a day.  Use MiraLax (over the counter) for constipation as needed.     Diet general    Complete by:  As directed      Discharge instructions    Complete by:  As directed   Pick up stool softner and laxative for home. Do not submerge incision under water. May shower starting Thursday 07/02/2014 Continue to use ice for pain and swelling from surgery.  Take Xarelto for two and a half more weeks, then discontinue Xarelto. Once the patient has completed the blood thinner regimen, then take a Baby 81 mg Aspirin daily for three more weeks.  When discharged from the skilled rehab facility, please have the facility set up the patient's [place] prior to being released.   Also provide the patient with their medications at time of release from the facility to include their pain medication, the muscle relaxants, and their blood thinner medication.  If the patient is still at the rehab facility at time of follow up appointment, please also assist the patient in arranging follow up appointment in our office and any transportation needs. ICE to the affected knee or hip every three hours for 30 minutes at a time and then as needed for pain and swelling.  Postoperative Constipation Protocol  Constipation - defined medically as fewer than three stools per week and severe constipation as less than one stool per week.  One of the most common issues patients have following surgery is constipation.  Even if you have a regular bowel pattern at home, your normal regimen is likely to be disrupted due to multiple reasons following surgery.  Combination of anesthesia, postoperative narcotics, change in  appetite and fluid intake all can affect your bowels.  In order to avoid complications following surgery, here are some recommendations in order to help you during your recovery period.  Colace (docusate) - Pick up an over-the-counter form of Colace or another stool softener and take twice a day as long as you are requiring postoperative pain medications.  Take with a full glass of water daily.  If you experience loose stools or diarrhea, hold the colace until you stool forms back up.  If your symptoms do not get better within 1 week or if they get worse, check with your doctor.  Dulcolax (bisacodyl) - Pick up over-the-counter and take as directed by the product packaging as needed to assist with the movement of your bowels.  Take with a full glass of water.  Use this product as needed if not relieved by Colace only.   MiraLax (polyethylene glycol) - Pick up over-the-counter to have on hand.  MiraLax is a solution that  will increase the amount of water in your bowels to assist with bowel movements.  Take as directed and can mix with a glass of water, juice, soda, coffee, or tea.  Take if you go more than two days without a movement. Do not use MiraLax more than once per day. Call your doctor if you are still constipated or irregular after using this medication for 7 days in a row.  If you continue to have problems with postoperative constipation, please contact the office for further assistance and recommendations.  If you experience "the worst abdominal pain ever" or develop nausea or vomiting, please contact the office immediatly for further recommendations for treatment.     Do not put a pillow under the knee. Place it under the heel.    Complete by:  As directed      Do not sit on low chairs, stoools or toilet seats, as it may be difficult to get up from low surfaces    Complete by:  As directed      Driving restrictions    Complete by:  As directed   No driving until released by the physician.       Increase activity slowly as tolerated    Complete by:  As directed      Lifting restrictions    Complete by:  As directed   No lifting until released by the physician.     Patient may shower    Complete by:  As directed   You may shower without a dressing once there is no drainage.  Do not wash over the wound.  If drainage remains, do not shower until drainage stops.     TED hose    Complete by:  As directed   Use stockings (TED hose) for 3 weeks on both leg(s).  You may remove them at night for sleeping.     Weight bearing as tolerated    Complete by:  As directed   Laterality:  left  Extremity:  Lower            Medication List    STOP taking these medications        FLAX SEED OIL PO     meloxicam 15 MG tablet  Commonly known as:  MOBIC     multivitamin tablet     Vitamin D-3 5000 UNITS Tabs      TAKE these medications        acetaminophen 325 MG tablet  Commonly known as:  TYLENOL  Take 2 tablets (650 mg total) by mouth every 6 (six) hours as needed for mild pain (or Fever >/= 101).     amLODipine 5 MG tablet  Commonly known as:  NORVASC  Take 5 mg by mouth every morning.     bisacodyl 10 MG suppository  Commonly known as:  DULCOLAX  Place 1 suppository (10 mg total) rectally daily as needed for moderate constipation.     DSS 100 MG Caps  Take 100 mg by mouth 2 (two) times daily.     fexofenadine 180 MG tablet  Commonly known as:  ALLEGRA  Take 180 mg by mouth daily.     irbesartan 300 MG tablet  Commonly known as:  AVAPRO  Take 300 mg by mouth every morning.     methocarbamol 500 MG tablet  Commonly known as:  ROBAXIN  Take 1 tablet (500 mg total) by mouth every 6 (six) hours as needed for muscle spasms.  metoCLOPramide 5 MG tablet  Commonly known as:  REGLAN  Take 1-2 tablets (5-10 mg total) by mouth every 8 (eight) hours as needed for nausea (if ondansetron (ZOFRAN) ineffective.).     ondansetron 4 MG tablet  Commonly known as:  ZOFRAN   Take 1 tablet (4 mg total) by mouth every 6 (six) hours as needed for nausea.     oxyCODONE 5 MG immediate release tablet  Commonly known as:  Oxy IR/ROXICODONE  Take 1-2 tablets (5-10 mg total) by mouth every 3 (three) hours as needed for moderate pain, severe pain or breakthrough pain.     pantoprazole 40 MG tablet  Commonly known as:  PROTONIX  Take 40 mg by mouth daily.     polyethylene glycol packet  Commonly known as:  MIRALAX / GLYCOLAX  Take 17 g by mouth daily as needed for mild constipation.     rivaroxaban 10 MG Tabs tablet  Commonly known as:  XARELTO  - Take 1 tablet (10 mg total) by mouth daily with breakfast. Take Xarelto for two and a half more weeks, then discontinue Xarelto.  - Once the patient has completed the blood thinner regimen, then take a Baby 81 mg Aspirin daily for three more weeks.     rosuvastatin 10 MG tablet  Commonly known as:  CRESTOR  Take 10 mg by mouth every Monday, Wednesday, and Friday.     traMADol 50 MG tablet  Commonly known as:  ULTRAM  Take 1-2 tablets (50-100 mg total) by mouth every 6 (six) hours as needed (mild pain).       Follow-up Information    Follow up with Gearlean Alf, MD. Schedule an appointment as soon as possible for a visit on 07/14/2014.   Specialty:  Orthopedic Surgery   Why:  Call [phone] tomorrow to make the appointment   Contact information:   [address] [place] [address] [phone]       Signed: Arlee Muslim, PA-C Orthopaedic Surgery 07/01/2014, 10:33 AM

## 2014-07-01 NOTE — Progress Notes (Signed)
CSW assisting with d/c planning. Met with pt / spouse this am to assist with SNF placement today. Pt complaining of not feeling ready for d/c. Concerns shared with PA. D/C on hold. SNF updated. CSW will continue to follow to assist with d/c planning.  Werner Lean LCSw [phone]

## 2014-07-01 NOTE — Progress Notes (Signed)
Physical Therapy Treatment Patient Details Name: Alexandra Gibson MRN: [id number] DOB: 1950-09-28 Today's Date: 07/01/2014    History of Present Illness LTKA    PT Comments    Patient tolerated  Ambulation, much improved in pain control  Follow Up Recommendations  SNF     Equipment Recommendations       Recommendations for Other Services       Precautions / Restrictions Precautions Precautions: Knee;Fall Precaution Comments: Instructed pt on KI use for amb  Required Braces or Orthoses: Knee Immobilizer - Left Knee Immobilizer - Left: Discontinue once straight leg raise with < 10 degree lag    Mobility  Bed Mobility         Supine to sit: Min assist Sit to supine: Min assist   General bed mobility comments: increased time and min assist to support L LE  Transfers   Equipment used: Rolling walker (2 wheeled) Transfers: Sit to/from Stand Sit to Stand: Min assist         General transfer comment:  cues for hand placement. from recliner and [facility]  Ambulation/Gait Ambulation/Gait assistance: Min assist Ambulation Distance (Feet): 125 Feet Assistive device: Rolling walker (2 wheeled) Gait Pattern/deviations: Step-to pattern;Step-through pattern Gait velocity: decreased   General Gait Details: cues for roling walker, posture.   Stairs            Wheelchair Mobility    Modified Rankin (Stroke Patients Only)       Balance                                    Cognition Arousal/Alertness: Awake/alert                          Exercises Total Joint Exercises Ankle Circles/Pumps: AROM;Both;10 reps;Supine Quad Sets: AROM;Both;10 reps;Supine Towel Squeeze: AROM;Both;Supine;10 reps Heel Slides: AAROM;Left;10 reps;Supine Hip ABduction/ADduction: AAROM;Left;10 reps;Supine Straight Leg Raises: AAROM;Left;10 reps;Supine Goniometric ROM: 10-50    General Comments        Pertinent Vitals/Pain Pain Score: 4  Pain Location: L  knee  Pain Descriptors / Indicators: Aching Pain Intervention(s): Premedicated before session;Ice applied    Home Living                      Prior Function            PT Goals (current goals can now be found in the care plan section) Progress towards PT goals: Progressing toward goals    Frequency  7X/week    PT Plan Current plan remains appropriate    Co-evaluation             End of Session Equipment Utilized During Treatment: Left knee immobilizer Activity Tolerance: Patient tolerated treatment well Patient left: with call bell/phone within reach;with family/visitor present;in bed     Time: 9509-3267 PT Time Calculation (min) (ACUTE ONLY): 23 min  Charges:  $Gait Training: 8-22 mins $Therapeutic Exercise: 8-22 mins $Self Care/Home Management: 04/06/23                    G Codes:      Alexandra Gibson 07/01/2014, 3:54 PM

## 2014-07-02 LAB — CBC
HCT: 35.2 % — ABNORMAL LOW (ref 36.0–46.0)
Hemoglobin: 11.3 g/dL — ABNORMAL LOW (ref 12.0–15.0)
MCH: 29.5 pg (ref 26.0–34.0)
MCHC: 32.1 g/dL (ref 30.0–36.0)
MCV: 91.9 fL (ref 78.0–100.0)
PLATELETS: 272 10*3/uL (ref 150–400)
RBC: 3.83 MIL/uL — ABNORMAL LOW (ref 3.87–5.11)
RDW: 13.3 % (ref 11.5–15.5)
WBC: 15.1 10*3/uL — ABNORMAL HIGH (ref 4.0–10.5)

## 2014-07-02 NOTE — Discharge Summary (Signed)
Physician Discharge Summary   Patient ID: Alexandra Gibson MRN: [id number] DOB/AGE: 64-01-1951 64 y.o.  Admit date: 06/29/2014 Discharge date: 11-19-215  Primary Diagnosis:  Osteoarthritis Left knee(s) Admission Diagnoses:  Past Medical History  Diagnosis Date  . Hypertension   . Hyperlipidemia   . Parathyroid tumor   . Osteoarthritis of knee     bil-gets injections  . GERD (gastroesophageal reflux disease)   . Fibromyalgia    Discharge Diagnoses:   Principal Problem:   OA (osteoarthritis) of knee  Estimated body mass index is 28.92 kg/(m^2) as calculated from the following:   Height as of this encounter: 5' 8.5" (1.74 m).   Weight as of this encounter: 87.544 kg (193 lb).  Procedure:  Procedure(s) (LRB): TOTAL KNEE ARTHROPLASTY LEFT  (Left)   Consults: None  HPI: Alexandra Gibson is a 63 y.o. year old female with end stage OA of her left knee with progressively worsening pain and dysfunction. She has constant pain, with activity and at rest and significant functional deficits with difficulties even with ADLs. She has had extensive non-op management including analgesics, injections of cortisone and viscosupplements, and home exercise program, but remains in significant pain with significant dysfunction. Radiographs show bone on bone arthritis lateral and patellofemoral with severe valgus deformity. She presents now for left Total Knee Arthroplasty.   Laboratory Data: Admission on 06/29/2014  Component Date Value Ref Range Status  . ABO/RH(D) 06/29/2014 O POS   Final  . Antibody Screen 06/29/2014 NEG   Final  . Sample Expiration 06/29/2014 07/02/2014   Final  . ABO/RH(D) 06/29/2014 O POS   Final  . WBC 06/30/2014 15.0* 4.0 - 10.5 K/uL Final  . RBC 06/30/2014 3.88  3.87 - 5.11 MIL/uL Final  . Hemoglobin 06/30/2014 11.5* 12.0 - 15.0 g/dL Final  . HCT 06/30/2014 35.6* 36.0 - 46.0 % Final  . MCV 06/30/2014 91.8  78.0 - 100.0 fL Final  . MCH 06/30/2014 29.6  26.0 - 34.0 pg Final   . MCHC 06/30/2014 32.3  30.0 - 36.0 g/dL Final  . RDW 06/30/2014 13.1  11.5 - 15.5 % Final  . Platelets 06/30/2014 273  150 - 400 K/uL Final  . Sodium 06/30/2014 141  137 - 147 mEq/L Final  . Potassium 06/30/2014 4.2  3.7 - 5.3 mEq/L Final  . Chloride 06/30/2014 102  96 - 112 mEq/L Final  . CO2 06/30/2014 28  19 - 32 mEq/L Final  . Glucose, Bld 06/30/2014 134* 70 - 99 mg/dL Final  . BUN 06/30/2014 9  6 - 23 mg/dL Final  . Creatinine, Ser 06/30/2014 0.73  0.50 - 1.10 mg/dL Final  . Calcium 06/30/2014 10.7* 8.4 - 10.5 mg/dL Final  . GFR calc non Af Amer 06/30/2014 88* >90 mL/min Final  . GFR calc Af Amer 06/30/2014 >90  >90 mL/min Final   Comment: (NOTE) The eGFR has been calculated using the CKD EPI equation. This calculation has not been validated in all clinical situations. eGFR's persistently <90 mL/min signify possible Chronic Kidney Disease.   . Anion gap 06/30/2014 11  5 - 15 Final  . WBC 07/01/2014 18.4* 4.0 - 10.5 K/uL Final  . RBC 07/01/2014 3.91  3.87 - 5.11 MIL/uL Final  . Hemoglobin 07/01/2014 11.6* 12.0 - 15.0 g/dL Final  . HCT 07/01/2014 36.6  36.0 - 46.0 % Final  . MCV 07/01/2014 93.6  78.0 - 100.0 fL Final  . MCH 07/01/2014 29.7  26.0 - 34.0 pg Final  . MCHC 07/01/2014  31.7  30.0 - 36.0 g/dL Final  . RDW 07/01/2014 13.3  11.5 - 15.5 % Final  . Platelets 07/01/2014 290  150 - 400 K/uL Final  . Sodium 07/01/2014 141  137 - 147 mEq/L Final  . Potassium 07/01/2014 4.1  3.7 - 5.3 mEq/L Final  . Chloride 07/01/2014 101  96 - 112 mEq/L Final  . CO2 07/01/2014 30  19 - 32 mEq/L Final  . Glucose, Bld 07/01/2014 119* 70 - 99 mg/dL Final  . BUN 07/01/2014 12  6 - 23 mg/dL Final  . Creatinine, Ser 07/01/2014 0.74  0.50 - 1.10 mg/dL Final  . Calcium 07/01/2014 10.8* 8.4 - 10.5 mg/dL Final  . GFR calc non Af Amer 07/01/2014 88* >90 mL/min Final  . GFR calc Af Amer 07/01/2014 >90  >90 mL/min Final   Comment: (NOTE) The eGFR has been calculated using the CKD EPI  equation. This calculation has not been validated in all clinical situations. eGFR's persistently <90 mL/min signify possible Chronic Kidney Disease.   . Anion gap 07/01/2014 10  5 - 15 Final  . WBC 07/02/2014 15.1* 4.0 - 10.5 K/uL Final  . RBC 07/02/2014 3.83* 3.87 - 5.11 MIL/uL Final  . Hemoglobin 07/02/2014 11.3* 12.0 - 15.0 g/dL Final  . HCT 07/02/2014 35.2* 36.0 - 46.0 % Final  . MCV 07/02/2014 91.9  78.0 - 100.0 fL Final  . MCH 07/02/2014 29.5  26.0 - 34.0 pg Final  . MCHC 07/02/2014 32.1  30.0 - 36.0 g/dL Final  . RDW 07/02/2014 13.3  11.5 - 15.5 % Final  . Platelets 07/02/2014 272  150 - 400 K/uL Final  Hospital Outpatient Visit on 06/22/2014  Component Date Value Ref Range Status  . aPTT 06/22/2014 30  24 - 37 seconds Final  . WBC 06/22/2014 8.3  4.0 - 10.5 K/uL Final  . RBC 06/22/2014 4.94  3.87 - 5.11 MIL/uL Final  . Hemoglobin 06/22/2014 14.5  12.0 - 15.0 g/dL Final  . HCT 06/22/2014 45.1  36.0 - 46.0 % Final  . MCV 06/22/2014 91.3  78.0 - 100.0 fL Final  . MCH 06/22/2014 29.4  26.0 - 34.0 pg Final  . MCHC 06/22/2014 32.2  30.0 - 36.0 g/dL Final  . RDW 06/22/2014 13.2  11.5 - 15.5 % Final  . Platelets 06/22/2014 306  150 - 400 K/uL Final  . Sodium 06/22/2014 142  137 - 147 mEq/L Final  . Potassium 06/22/2014 4.4  3.7 - 5.3 mEq/L Final  . Chloride 06/22/2014 103  96 - 112 mEq/L Final  . CO2 06/22/2014 28  19 - 32 mEq/L Final  . Glucose, Bld 06/22/2014 84  70 - 99 mg/dL Final  . BUN 06/22/2014 14  6 - 23 mg/dL Final  . Creatinine, Ser 06/22/2014 0.75  0.50 - 1.10 mg/dL Final  . Calcium 06/22/2014 11.0* 8.4 - 10.5 mg/dL Final  . Total Protein 06/22/2014 8.0  6.0 - 8.3 g/dL Final  . Albumin 06/22/2014 4.5  3.5 - 5.2 g/dL Final  . AST 06/22/2014 17  0 - 37 U/L Final   Comment: SLIGHT HEMOLYSIS HEMOLYSIS AT THIS LEVEL MAY AFFECT RESULT   . ALT 06/22/2014 12  0 - 35 U/L Final  . Alkaline Phosphatase 06/22/2014 109  39 - 117 U/L Final  . Total Bilirubin 06/22/2014 0.7   0.3 - 1.2 mg/dL Final  . GFR calc non Af Amer 06/22/2014 88* >90 mL/min Final  . GFR calc Af Amer 06/22/2014 >90  >90 mL/min  Final   Comment: (NOTE) The eGFR has been calculated using the CKD EPI equation. This calculation has not been validated in all clinical situations. eGFR's persistently <90 mL/min signify possible Chronic Kidney Disease.   . Anion gap 06/22/2014 11  5 - 15 Final  . Prothrombin Time 06/22/2014 13.6  11.6 - 15.2 seconds Final  . INR 06/22/2014 1.03  0.00 - 1.49 Final  . Color, Urine 06/22/2014 YELLOW  YELLOW Final  . APPearance 06/22/2014 CLEAR  CLEAR Final  . Specific Gravity, Urine 06/22/2014 1.015  1.005 - 1.030 Final  . pH 06/22/2014 7.0  5.0 - 8.0 Final  . Glucose, UA 06/22/2014 NEGATIVE  NEGATIVE mg/dL Final  . Hgb urine dipstick 06/22/2014 NEGATIVE  NEGATIVE Final  . Bilirubin Urine 06/22/2014 NEGATIVE  NEGATIVE Final  . Ketones, ur 06/22/2014 NEGATIVE  NEGATIVE mg/dL Final  . Protein, ur 06/22/2014 NEGATIVE  NEGATIVE mg/dL Final  . Urobilinogen, UA 06/22/2014 0.2  0.0 - 1.0 mg/dL Final  . Nitrite 06/22/2014 NEGATIVE  NEGATIVE Final  . Leukocytes, UA 06/22/2014 NEGATIVE  NEGATIVE Final   MICROSCOPIC NOT DONE ON URINES WITH NEGATIVE PROTEIN, BLOOD, LEUKOCYTES, NITRITE, OR GLUCOSE <1000 mg/dL.  Marland Kitchen MRSA, PCR 06/22/2014 NEGATIVE  NEGATIVE Final  . Staphylococcus aureus 06/22/2014 NEGATIVE  NEGATIVE Final   Comment:        The Xpert SA Assay (FDA approved for NASAL specimens in patients over 60 years of age), is one component of a comprehensive surveillance program.  Test performance has been validated by [employer] for patients greater than or equal to 32 year old. It is not intended to diagnose infection nor to guide or monitor treatment.      X-Rays:Dg Chest 2 View  06/22/2014   CLINICAL DATA:  Preoperative exam prior left knee surgery; nonsmoker; history of hypertension  EXAM: CHEST  2 VIEW  COMPARISON:  Chest x-ray dated October 14, 2008  FINDINGS:  The lungs are well-expanded and clear. The heart and pulmonary vascularity are within the limits of normal. There is no pleural effusion or pneumothorax. There is stable mid thoracic dextroscoliosis.  IMPRESSION: There is no active cardiopulmonary disease.   Electronically Signed   By: David  Martinique   On: 06/22/2014 10:35    EKG:No orders found for this or any previous visit.   Hospital Course: Alexandra Gibson is a 63 y.o. who was admitted to [facility]. They were brought to the operating room on 06/29/2014 and underwent Procedure(s): TOTAL KNEE ARTHROPLASTY LEFT .  Patient tolerated the procedure well and was later transferred to the recovery room and then to the orthopaedic floor for postoperative care.  They were given PO and IV analgesics for pain control following their surgery.  They were given 24 hours of postoperative antibiotics of  Anti-infectives    Start     Dose/Rate Route Frequency Ordered Stop   06/29/14 1500  ceFAZolin (ANCEF) IVPB 2 g/50 mL premix     2 g100 mL/hr over 30 Minutes Intravenous Every 6 hours 06/29/14 1225 06/29/14 2059   06/29/14 0643  ceFAZolin (ANCEF) IVPB 2 g/50 mL premix     2 g100 mL/hr over 30 Minutes Intravenous On call to O.R. 06/29/14 8127 06/29/14 0919     and started on DVT prophylaxis in the form of Xarelto. PT and OT were ordered for total joint protocol. Social worker got involved to assist with placement of the patient. Discharge planning consulted to help with postop disposition and equipment needs. Patient had  a rough night on the evening of surgery with pain. They started to get up OOB with therapy on day one. Hemovac drain was pulled without difficulty. Continued to work with therapy into day two. Dressing was changed on day two and the incision was healing well. Patient was seen in rounds and was setup to go to the SNF however, she got up with therapy and had increased pain and did not feel well.  The transfer was help to make sure that  she could tolerate the pain meds, no nausea, tolerating meals.  She was seen in rounds again on day three by Dr. Wynelle Link and then ready to go to [facility].  Diet: Regular diet Activity:WBAT Follow-up:in 2 weeks Disposition - Skilled nursing facility Discharged Condition: good      Discharge Instructions    Call MD / Call 911    Complete by:  As directed   If you experience chest pain or shortness of breath, CALL 911 and be transported to the hospital emergency room.  If you develope a fever above 101 F, pus (white drainage) or increased drainage or redness at the wound, or calf pain, call your surgeon's office.     Change dressing    Complete by:  As directed   Change dressing daily with sterile 4 x 4 inch gauze dressing and apply TED hose. Do not submerge the incision under water.     Constipation Prevention    Complete by:  As directed   Drink plenty of fluids.  Prune juice may be helpful.  You may use a stool softener, such as Colace (over the counter) 100 mg twice a day.  Use MiraLax (over the counter) for constipation as needed.     Diet general    Complete by:  As directed      Discharge instructions    Complete by:  As directed   Pick up stool softner and laxative for home. Do not submerge incision under water. May shower starting Thursday 07/02/2014 Continue to use ice for pain and swelling from surgery.  Take Xarelto for two and a half more weeks, then discontinue Xarelto. Once the patient has completed the blood thinner regimen, then take a Baby 81 mg Aspirin daily for three more weeks.  When discharged from the skilled rehab facility, please have the facility set up the patient's [place] prior to being released.   Also provide the patient with their medications at time of release from the facility to include their pain medication, the muscle relaxants, and their blood thinner medication.  If the patient is still at the rehab facility at time of  follow up appointment, please also assist the patient in arranging follow up appointment in our office and any transportation needs. ICE to the affected knee or hip every three hours for 30 minutes at a time and then as needed for pain and swelling.  Postoperative Constipation Protocol  Constipation - defined medically as fewer than three stools per week and severe constipation as less than one stool per week.  One of the most common issues patients have following surgery is constipation.  Even if you have a regular bowel pattern at home, your normal regimen is likely to be disrupted due to multiple reasons following surgery.  Combination of anesthesia, postoperative narcotics, change in appetite and fluid intake all can affect your bowels.  In order to avoid complications following surgery, here are some recommendations in order to help you during your  recovery period.  Colace (docusate) - Pick up an over-the-counter form of Colace or another stool softener and take twice a day as long as you are requiring postoperative pain medications.  Take with a full glass of water daily.  If you experience loose stools or diarrhea, hold the colace until you stool forms back up.  If your symptoms do not get better within 1 week or if they get worse, check with your doctor.  Dulcolax (bisacodyl) - Pick up over-the-counter and take as directed by the product packaging as needed to assist with the movement of your bowels.  Take with a full glass of water.  Use this product as needed if not relieved by Colace only.   MiraLax (polyethylene glycol) - Pick up over-the-counter to have on hand.  MiraLax is a solution that will increase the amount of water in your bowels to assist with bowel movements.  Take as directed and can mix with a glass of water, juice, soda, coffee, or tea.  Take if you go more than two days without a movement. Do not use MiraLax more than once per day. Call your doctor if you are still  constipated or irregular after using this medication for 7 days in a row.  If you continue to have problems with postoperative constipation, please contact the office for further assistance and recommendations.  If you experience "the worst abdominal pain ever" or develop nausea or vomiting, please contact the office immediatly for further recommendations for treatment.       Do not put a pillow under the knee. Place it under the heel.    Complete by:  As directed      Do not sit on low chairs, stoools or toilet seats, as it may be difficult to get up from low surfaces    Complete by:  As directed      Driving restrictions    Complete by:  As directed   No driving until released by the physician.     Increase activity slowly as tolerated    Complete by:  As directed      Lifting restrictions    Complete by:  As directed   No lifting until released by the physician.     Patient may shower    Complete by:  As directed   You may shower without a dressing once there is no drainage.  Do not wash over the wound.  If drainage remains, do not shower until drainage stops.     TED hose    Complete by:  As directed   Use stockings (TED hose) for 3 weeks on both leg(s).  You may remove them at night for sleeping.     Weight bearing as tolerated    Complete by:  As directed   Laterality:  left  Extremity:  Lower            Medication List    STOP taking these medications        FLAX SEED OIL PO     meloxicam 15 MG tablet  Commonly known as:  MOBIC     multivitamin tablet     Vitamin D-3 5000 UNITS Tabs      TAKE these medications        acetaminophen 325 MG tablet  Commonly known as:  TYLENOL  Take 2 tablets (650 mg total) by mouth every 6 (six) hours as needed for mild pain (or Fever >/= 101).     amLODipine  5 MG tablet  Commonly known as:  NORVASC  Take 5 mg by mouth every morning.     bisacodyl 10 MG suppository  Commonly known as:  DULCOLAX  Place 1 suppository (10 mg  total) rectally daily as needed for moderate constipation.     DSS 100 MG Caps  Take 100 mg by mouth 2 (two) times daily.     fexofenadine 180 MG tablet  Commonly known as:  ALLEGRA  Take 180 mg by mouth daily.     irbesartan 300 MG tablet  Commonly known as:  AVAPRO  Take 300 mg by mouth every morning.     methocarbamol 500 MG tablet  Commonly known as:  ROBAXIN  Take 1 tablet (500 mg total) by mouth every 6 (six) hours as needed for muscle spasms.     metoCLOPramide 5 MG tablet  Commonly known as:  REGLAN  Take 1-2 tablets (5-10 mg total) by mouth every 8 (eight) hours as needed for nausea (if ondansetron (ZOFRAN) ineffective.).     ondansetron 4 MG tablet  Commonly known as:  ZOFRAN  Take 1 tablet (4 mg total) by mouth every 6 (six) hours as needed for nausea.     oxyCODONE 5 MG immediate release tablet  Commonly known as:  Oxy IR/ROXICODONE  Take 1-2 tablets (5-10 mg total) by mouth every 3 (three) hours as needed for moderate pain, severe pain or breakthrough pain.     pantoprazole 40 MG tablet  Commonly known as:  PROTONIX  Take 40 mg by mouth daily.     polyethylene glycol packet  Commonly known as:  MIRALAX / GLYCOLAX  Take 17 g by mouth daily as needed for mild constipation.     rivaroxaban 10 MG Tabs tablet  Commonly known as:  XARELTO  - Take 1 tablet (10 mg total) by mouth daily with breakfast. Take Xarelto for two and a half more weeks, then discontinue Xarelto.  - Once the patient has completed the blood thinner regimen, then take a Baby 81 mg Aspirin daily for three more weeks.     rosuvastatin 10 MG tablet  Commonly known as:  CRESTOR  Take 10 mg by mouth every Monday, Wednesday, and Friday.     traMADol 50 MG tablet  Commonly known as:  ULTRAM  Take 1-2 tablets (50-100 mg total) by mouth every 6 (six) hours as needed (mild pain).       Follow-up Information    Follow up with Gearlean Alf, MD. Schedule an appointment as soon as possible for a  visit on 07/14/2014.   Specialty:  Orthopedic Surgery   Why:  Call [phone] tomorrow to make the appointment   Contact information:   [address] [place] [address] [phone]       Signed: Arlee Muslim, PA-C Orthopaedic Surgery 07/02/2014, 8:13 AM

## 2014-07-02 NOTE — Progress Notes (Signed)
Physical Therapy Treatment Patient Details Name: Alexandra Gibson MRN: [id number] DOB: Jun 12, 1951 Today's Date: 07/02/2014    History of Present Illness LTKA    PT Comments    POD # 3 am session.  Applied KI and instructed pt on use.  Assisted OOB to amb to BR.  Assisted with hygiene.  Pt was only able to amb back to recliner due to pain level.  Repositioned and applied ICE.  Follow Up Recommendations  SNF (via car)     Equipment Recommendations       Recommendations for Other Services       Precautions / Restrictions Precautions Precautions: Knee;Fall Precaution Comments: Instructed pt on KI use for amb  Required Braces or Orthoses: Knee Immobilizer - Left Knee Immobilizer - Left: Discontinue once straight leg raise with < 10 degree lag Restrictions Weight Bearing Restrictions: No    Mobility  Bed Mobility Overal bed mobility: Needs Assistance Bed Mobility: Supine to Sit   Sidelying to sit: Min assist       General bed mobility comments: increased time and min assist to support L LE  Transfers Overall transfer level: Needs assistance Equipment used: Rolling walker (2 wheeled) Transfers: Sit to/from Stand Sit to Stand: Min assist;Min guard         General transfer comment:  cues for hand placement. from recliner and toilet  Ambulation/Gait Ambulation/Gait assistance: Min assist;Min guard Ambulation Distance (Feet): 22 Feet Assistive device: Rolling walker (2 wheeled) Gait Pattern/deviations: Step-to pattern;Decreased step length - left Gait velocity: decreased   General Gait Details: cues for roling walker, posture.  Pt only tolerated amb to and from bathroom.   Stairs            Wheelchair Mobility    Modified Rankin (Stroke Patients Only)       Balance                                    Cognition                            Exercises      General Comments        Pertinent Vitals/Pain Pain Score: 8   Pain Descriptors / Indicators: Aching;Constant Pain Intervention(s): Monitored during session;Premedicated before session;Repositioned;Ice applied    Home Living                      Prior Function            PT Goals (current goals can now be found in the care plan section) Progress towards PT goals: Progressing toward goals    Frequency  7X/week    PT Plan Current plan remains appropriate    Co-evaluation             End of Session Equipment Utilized During Treatment: Left knee immobilizer Activity Tolerance: Patient limited by pain Patient left: in chair;with call bell/phone within reach;with family/visitor present     Time: 0737-1062 PT Time Calculation (min) (ACUTE ONLY): 24 min  Charges:  $Gait Training: 8-22 mins $Therapeutic Activity: 8-22 mins                    G Codes:      Rica Koyanagi  PTA [facility]  Acute  Rehab Pager      [phone]

## 2014-07-02 NOTE — Progress Notes (Signed)
Clinical Social Work Department CLINICAL SOCIAL WORK PLACEMENT NOTE 07/02/2014  Patient:  Alexandra Gibson, Alexandra Gibson  Account Number:  [id number] Admit date:  06/29/2014  Clinical Social Worker:  Werner Lean, LCSW  Date/time:  06/30/2014 11:06 AM  Clinical Social Work is seeking post-discharge placement for this patient at the following level of care:   SKILLED NURSING   (*CSW will update this form in Epic as items are completed)     Patient/family provided with [place] Department of Clinical Social Work's list of facilities offering this level of care within the geographic area requested by the patient (or if unable, by the patient's family).  06/30/2014  Patient/family informed of their freedom to choose among providers that offer the needed level of care, that participate in Medicare, Medicaid or managed care program needed by the patient, have an available bed and are willing to accept the patient.    Patient/family informed of [facility]' ownership interest in [facility], as well as of the fact that they are under no obligation to receive care at this facility.  PASARR submitted to EDS on 06/30/2014 PASARR number received on 06/30/2014  FL2 transmitted to all facilities in geographic area requested by pt/family on  06/30/2014 FL2 transmitted to all facilities within larger geographic area on   Patient informed that his/her managed care company has contracts with or will negotiate with  certain facilities, including the following:     Patient/family informed of bed offers received:  06/30/2014 Patient chooses bed at  Physician recommends and patient chooses bed at  [place] [facility]  Patient to be transferred to [place] on  07/02/2014 Patient to be transferred to facility by [facility] Patient and family notified of transfer on 07/02/2014 Name of family member notified:  SPOUSE  The following physician request were entered in Epic:   Additional Comments: Pt / spouse  are in agreement with d/c to SNF today. PT approved transport by car. NSG reviewed d/c summary, scripts, avs. Scripts included in d/c packet.  Werner Lean LCSW [phone]

## 2014-07-02 NOTE — Plan of Care (Signed)
Problem: Discharge Progression Outcomes Goal: CMS/Neurovascular status at or above baseline Outcome: Completed/Met Date Met:  07/02/14 Goal: Anticoagulant follow-up in place Outcome: Completed/Met Date Met:  07/02/14 Goal: Pain controlled with appropriate interventions Outcome: Completed/Met Date Met:  07/02/14 Goal: Hemodynamically stable Outcome: Completed/Met Date Met:  23/46/88 Goal: Complications resolved/controlled Outcome: Completed/Met Date Met:  07/02/14 Goal: Tolerates diet Outcome: Completed/Met Date Met:  07/02/14 Goal: Activity appropriate for discharge plan Outcome: Completed/Met Date Met:  07/02/14 Goal: Ambulates safely using assistive device Outcome: Completed/Met Date Met:  07/02/14 Goal: Follows weight - bearing limitations Outcome: Not Applicable Date Met:  73/73/08 Goal: Discharge plan in place and appropriate Outcome: Completed/Met Date Met:  07/02/14 Goal: Negotiates stairs Outcome: Completed/Met Date Met:  07/02/14 Goal: Demonstrates ADLs as appropriate Outcome: Not Applicable Date Met:  16/83/87 Goal: Incision without S/S infection Outcome: Completed/Met Date Met:  07/02/14

## 2014-07-02 NOTE — Plan of Care (Signed)
Problem: Phase III Progression Outcomes Goal: Pain controlled on oral analgesia Outcome: Completed/Met Date Met:  07/02/14 Goal: Ambulates Outcome: Completed/Met Date Met:  07/02/14 Goal: Incision clean - minimal/no drainage Outcome: Completed/Met Date Met:  07/02/14     

## 2014-07-02 NOTE — Progress Notes (Signed)
   Subjective: 3 Days Post-Op Procedure(s) (LRB): TOTAL KNEE ARTHROPLASTY LEFT  (Left) Patient reports pain as mild.   Patient seen in rounds with Dr. Wynelle Link. Patient is well, and has had no acute complaints or problems.  Possible discharge yesterday but patient got up with therapy and did not feel well. Had increase in pain.  Held on the transfer.  Feeling better today and bed available.  Will discharge today. Patient is ready to go to the SNF.  Objective: Vital signs in last 24 hours: Temp:  [99.1 F (37.3 C)-100.1 F (37.8 C)] 99.1 F (37.3 C) (11/19 0558) Pulse Rate:  [91-98] 95 (11/19 0558) Resp:  [16] 16 (11/19 0558) BP: (130-147)/(66-87) 130/66 mmHg (11/19 0558) SpO2:  [95 %-98 %] 95 % (11/19 0558)  Intake/Output from previous day:  Intake/Output Summary (Last 24 hours) at 07/02/14 0808 Last data filed at 07/01/14 2143  Gross per 24 hour  Intake    720 ml  Output   1375 ml  Net   -655 ml    Intake/Output this shift:    Labs:  Recent Labs  06/30/14 0527 07/01/14 0402 07/02/14 0408  HGB 11.5* 11.6* 11.3*    Recent Labs  07/01/14 0402 07/02/14 0408  WBC 18.4* 15.1*  RBC 3.91 3.83*  HCT 36.6 35.2*  PLT 290 272    Recent Labs  06/30/14 0527 07/01/14 0402  NA 141 141  K 4.2 4.1  CL 102 101  CO2 28 30  BUN 9 12  CREATININE 0.73 0.74  GLUCOSE 134* 119*  CALCIUM 10.7* 10.8*   No results for input(s): LABPT, INR in the last 72 hours.  EXAM: General - Patient is Alert and Appropriate Extremity - Neurovascular intact Sensation intact distally Dorsiflexion/Plantar flexion intact Incision - clean, dry, no drainage Motor Function - intact, moving foot and toes well on exam.   Assessment/Plan: 3 Days Post-Op Procedure(s) (LRB): TOTAL KNEE ARTHROPLASTY LEFT  (Left) Procedure(s) (LRB): TOTAL KNEE ARTHROPLASTY LEFT  (Left) Past Medical History  Diagnosis Date  . Hypertension   . Hyperlipidemia   . Parathyroid tumor   . Osteoarthritis of knee       bil-gets injections  . GERD (gastroesophageal reflux disease)   . Fibromyalgia    Principal Problem:   OA (osteoarthritis) of knee  Estimated body mass index is 28.92 kg/(m^2) as calculated from the following:   Height as of this encounter: 5' 8.5" (1.74 m).   Weight as of this encounter: 87.544 kg (193 lb). Up with therapy Discharge to SNF - [facility] - Regular diet Follow up - in 2 weeks Activity - WBAT Disposition - Home Condition Upon Discharge - Good D/C Meds - See DC Summary DVT Prophylaxis - Xarelto  Arlee Muslim, PA-C Orthopaedic Surgery 07/02/2014, 8:08 AM

## 2014-07-03 ENCOUNTER — Encounter: Payer: Self-pay | Admitting: Adult Health

## 2014-07-03 ENCOUNTER — Non-Acute Institutional Stay (SKILLED_NURSING_FACILITY): Payer: Federal, State, Local not specified - PPO | Admitting: Adult Health

## 2014-07-03 DIAGNOSIS — K5900 Constipation, unspecified: Secondary | ICD-10-CM

## 2014-07-03 DIAGNOSIS — M1712 Unilateral primary osteoarthritis, left knee: Secondary | ICD-10-CM | POA: Diagnosis not present

## 2014-07-03 DIAGNOSIS — D62 Acute posthemorrhagic anemia: Secondary | ICD-10-CM

## 2014-07-03 DIAGNOSIS — K219 Gastro-esophageal reflux disease without esophagitis: Secondary | ICD-10-CM

## 2014-07-03 DIAGNOSIS — E7800 Pure hypercholesterolemia, unspecified: Secondary | ICD-10-CM

## 2014-07-03 DIAGNOSIS — J309 Allergic rhinitis, unspecified: Secondary | ICD-10-CM

## 2014-07-03 DIAGNOSIS — I10 Essential (primary) hypertension: Secondary | ICD-10-CM

## 2014-07-06 ENCOUNTER — Non-Acute Institutional Stay (SKILLED_NURSING_FACILITY): Payer: Federal, State, Local not specified - PPO | Admitting: Internal Medicine

## 2014-07-06 ENCOUNTER — Encounter: Payer: Self-pay | Admitting: Internal Medicine

## 2014-07-06 DIAGNOSIS — I10 Essential (primary) hypertension: Secondary | ICD-10-CM

## 2014-07-06 DIAGNOSIS — M1712 Unilateral primary osteoarthritis, left knee: Secondary | ICD-10-CM

## 2014-07-06 DIAGNOSIS — D72829 Elevated white blood cell count, unspecified: Secondary | ICD-10-CM

## 2014-07-06 DIAGNOSIS — E7800 Pure hypercholesterolemia, unspecified: Secondary | ICD-10-CM

## 2014-07-06 DIAGNOSIS — E785 Hyperlipidemia, unspecified: Secondary | ICD-10-CM

## 2014-07-06 DIAGNOSIS — D62 Acute posthemorrhagic anemia: Secondary | ICD-10-CM

## 2014-07-06 DIAGNOSIS — M791 Myalgia: Secondary | ICD-10-CM | POA: Diagnosis not present

## 2014-07-06 DIAGNOSIS — M609 Myositis, unspecified: Secondary | ICD-10-CM

## 2014-07-06 DIAGNOSIS — IMO0001 Reserved for inherently not codable concepts without codable children: Secondary | ICD-10-CM

## 2014-07-06 DIAGNOSIS — K219 Gastro-esophageal reflux disease without esophagitis: Secondary | ICD-10-CM

## 2014-07-06 DIAGNOSIS — K5900 Constipation, unspecified: Secondary | ICD-10-CM | POA: Diagnosis not present

## 2014-07-06 NOTE — Progress Notes (Signed)
Patient ID: Alexandra Gibson, female   DOB: 12/04/1950, 63 y.o.   MRN: [id number]    [facility]   PCP: Thressa Sheller, MD  Code Status: full code  Allergies  Allergen Reactions  . Atorvastatin Other (See Comments)    REACTION: leg cramp    Chief Complaint  Patient presents with  . New Admit To SNF     HPI:  63 y/o female pt here for STR post hospital admission from 06/29/14-07/02/14 with left knee OA. She underwent left total knee arthroplasty. She is seen in her room. She is in no distress. Her pain is under control with current regimen. She is working with therapy team. Denies any concerns. No concern from nursing staff.   Review of Systems:  Constitutional: Negative for fever, chills, diaphoresis.  HENT: Negative for congestion Respiratory: Negative for cough, sputum production, shortness of breath and wheezing.   Cardiovascular: Negative for chest pain, palpitations.  Gastrointestinal: Negative for heartburn, nausea, vomiting, abdominal pain. Regular bowel movement Genitourinary: Negative for dysuria, urgency, frequency, flank pain.  Musculoskeletal: Negative for back pain, falls, joint pain. Neurological: Negative for weakness,dizziness, tingling, focal weakness and headaches.  Psychiatric/Behavioral: Negative for depression  Past Medical History  Diagnosis Date  . Hypertension   . Hyperlipidemia   . Parathyroid tumor   . Osteoarthritis of knee     bil-gets injections  . GERD (gastroesophageal reflux disease)   . Fibromyalgia    Past Surgical History  Procedure Laterality Date  . Appendectomy    . Laparoscopic endometriosis fulguration    . Knee arthroscopy      left  . Total knee arthroplasty Left 06/29/2014    Procedure: TOTAL KNEE ARTHROPLASTY LEFT ;  Surgeon: Gearlean Alf, MD;  Location: [facility];  Service: Orthopedics;  Laterality: Left;   Social History:   reports that she has never smoked. She has never used  smokeless tobacco. She reports that she does not drink alcohol or use illicit drugs.  Family History  Problem Relation Age of Onset  . Colon cancer Neg Hx     Medications: Patient's Medications  New Prescriptions   No medications on file  Previous Medications   ACETAMINOPHEN (TYLENOL) 325 MG TABLET    Take 2 tablets (650 mg total) by mouth every 6 (six) hours as needed for mild pain (or Fever >/= 101).   AMLODIPINE (NORVASC) 5 MG TABLET    Take 5 mg by mouth every morning.   BISACODYL (DULCOLAX) 10 MG SUPPOSITORY    Place 1 suppository (10 mg total) rectally daily as needed for moderate constipation.   DOCUSATE SODIUM 100 MG CAPS    Take 100 mg by mouth 2 (two) times daily.   FEXOFENADINE (ALLEGRA) 180 MG TABLET    Take 180 mg by mouth daily.   IRBESARTAN (AVAPRO) 300 MG TABLET    Take 300 mg by mouth every morning.   METHOCARBAMOL (ROBAXIN) 500 MG TABLET    Take 1 tablet (500 mg total) by mouth every 6 (six) hours as needed for muscle spasms.   METOCLOPRAMIDE (REGLAN) 5 MG TABLET    Take 1-2 tablets (5-10 mg total) by mouth every 8 (eight) hours as needed for nausea (if ondansetron (ZOFRAN) ineffective.).   ONDANSETRON (ZOFRAN) 4 MG TABLET    Take 1 tablet (4 mg total) by mouth every 6 (six) hours as needed for nausea.   OXYCODONE (OXY IR/ROXICODONE) 5 MG IMMEDIATE RELEASE TABLET    Take 1-2 tablets (5-10  mg total) by mouth every 3 (three) hours as needed for moderate pain, severe pain or breakthrough pain.   PANTOPRAZOLE (PROTONIX) 40 MG TABLET    Take 40 mg by mouth daily.     POLYETHYLENE GLYCOL (MIRALAX / GLYCOLAX) PACKET    Take 17 g by mouth daily as needed for mild constipation.   RIVAROXABAN (XARELTO) 10 MG TABS TABLET    Take 1 tablet (10 mg total) by mouth daily with breakfast. Take Xarelto for two and a half more weeks, then discontinue Xarelto. Once the patient has completed the blood thinner regimen, then take a Baby 81 mg Aspirin daily for three more weeks.   ROSUVASTATIN  (CRESTOR) 10 MG TABLET    Take 10 mg by mouth every Monday, Wednesday, and Friday.    TRAMADOL (ULTRAM) 50 MG TABLET    Take 1-2 tablets (50-100 mg total) by mouth every 6 (six) hours as needed (mild pain).  Modified Medications   No medications on file  Discontinued Medications   No medications on file     Physical Exam: Filed Vitals:   07/06/14 0935  BP: 119/72  Pulse: 87  Temp: 98.5 F (36.9 C)  Resp: 14  Weight: 194 lb 12.8 oz (88.361 kg)    General- elderly female in no acute distress Head- atraumatic, normocephalic Eyes- PERRLA, EOMI, no pallor, no icterus, no discharge Neck- no cervical lymphadenopathy Cardiovascular- normal s1,s2, no murmurs/ rubs/ gallops, dorsalis pedis palpable Respiratory- bilateral clear to auscultation, no wheeze, no rhonchi, no crackles, no use of accessory muscles Abdomen- bowel sounds present, soft, non tender Musculoskeletal- able to move all 4 extremities, left knee ROM limited. Trace left leg edema Neurological- no focal deficit Skin- warm and dry, dressing site in left knee clean and dry, glued incision healing well Psychiatry- alert and oriented to person, place and time, normal mood and affect    Labs reviewed: Basic Metabolic Panel:  Recent Labs  06/22/14 0955 06/30/14 0527 07/01/14 0402  NA 142 141 141  K 4.4 4.2 4.1  CL 103 102 101  CO2 28 28 30   GLUCOSE 84 134* 119*  BUN 14 9 12   CREATININE 0.75 0.73 0.74  CALCIUM 11.0* 10.7* 10.8*   Liver Function Tests:  Recent Labs  06/22/14 0955  AST 17  ALT 12  ALKPHOS 109  BILITOT 0.7  PROT 8.0  ALBUMIN 4.5   No results for input(s): LIPASE, AMYLASE in the last 8760 hours. No results for input(s): AMMONIA in the last 8760 hours. CBC:  Recent Labs  06/30/14 0527 07/01/14 0402 07/02/14 0408  WBC 15.0* 18.4* 15.1*  HGB 11.5* 11.6* 11.3*  HCT 35.6* 36.6 35.2*  MCV 91.8 93.6 91.9  PLT 273 290 272    Assessment/Plan  Left knee OA S/p left knee arthroplasty.  Has f/u with orthopedics on 07/14/14. Continue left knee dressing change. Continue oxyIR 5 mg 1-2 tab q3h prn for pain with tramadol. Continue robaxin for muscle spasm. To wear ted hose thigh high. Will have patient work with PT/OT as tolerated to regain strength and restore function.  Fall precautions are in place. WBAT. Continue xarelto for dvt prophylaxis  fibromyalagia Stable with current pain regimen. Continue this and monitor clinically  Leukocytosis Unclear etiology, no signs of infection at incision site, is afebrile. Check cbc with diff. Likely stress response  Anemia Likely post op from blood loss. Check h&h  Constipation Stable on colace 100 mg bid with prn miralax and dulcolax supp.   gerd Stable.  Continue protonix  HLD atable on crestor 10 mg thrice a week  HTN Stable, continue amlodipine 5 mg daily and irbesartan 300 mg daily, monitor bp   Family/ staff Communication: reviewed care plan with patient and nursing supervisor   Goals of care: short term rehabilitation    Labs/tests ordered- cbc with diff    Blanchie Serve, MD  [place] (Monday-Friday 8 am - 5 pm) [phone] (afterhours)

## 2014-07-14 ENCOUNTER — Encounter: Payer: Self-pay | Admitting: Adult Health

## 2014-07-14 ENCOUNTER — Non-Acute Institutional Stay (SKILLED_NURSING_FACILITY): Payer: Federal, State, Local not specified - PPO | Admitting: Adult Health

## 2014-07-14 DIAGNOSIS — K219 Gastro-esophageal reflux disease without esophagitis: Secondary | ICD-10-CM | POA: Diagnosis not present

## 2014-07-14 DIAGNOSIS — J309 Allergic rhinitis, unspecified: Secondary | ICD-10-CM | POA: Diagnosis not present

## 2014-07-14 DIAGNOSIS — D62 Acute posthemorrhagic anemia: Secondary | ICD-10-CM

## 2014-07-14 DIAGNOSIS — E785 Hyperlipidemia, unspecified: Secondary | ICD-10-CM | POA: Diagnosis not present

## 2014-07-14 DIAGNOSIS — K5900 Constipation, unspecified: Secondary | ICD-10-CM

## 2014-07-14 DIAGNOSIS — D72829 Elevated white blood cell count, unspecified: Secondary | ICD-10-CM

## 2014-07-14 DIAGNOSIS — I10 Essential (primary) hypertension: Secondary | ICD-10-CM

## 2014-07-14 DIAGNOSIS — M1712 Unilateral primary osteoarthritis, left knee: Secondary | ICD-10-CM | POA: Diagnosis not present

## 2014-07-14 NOTE — Progress Notes (Addendum)
Patient ID: Alexandra Gibson, female   DOB: 07-26-1951, 63 y.o.   MRN: [id number]   07/03/14  Facility:  Nursing Home Location:  [place] Room Number: 308-P LEVEL OF CARE:  SNF (31)  Chief Complaint  Patient presents with  . Hospitalization Follow-up     osteoarthritis status post left total knee arthroplasty, hypertension, allergic rhinitis, GERD, hyperlipidemia, constipation and anemia    HISTORY OF PRESENT ILLNESS:  This is a 63 year old female who has been admitted to [facility] on 07/02/14 from [facility] with Osteoarthritis S/P left total knee arthroplasty. She has been admitted for a short-term rehabilitation.  REASSESSMENT OF ONGOING PROBLEMS:  HTN: Pt 's HTN remains stable.  Denies CP, sob, DOE, headaches, dizziness or visual disturbances.  No complications from the medications currently being used.  Last BP : 118/74  ALLERGIC RHINITIS: Allergic rhinitis remains stable.  Patient denies ongoing symptoms such as runny nose sneezing or tearing. No complications reported from the current medication(s) being used.  GERD: pt's GERD is stable.  Denies ongoing heartburn, abd. Pain, nausea or vomiting.  Currently on a PPI & tolerates it without any adverse reactions.  CONSTIPATION: The constipation remains stable. No complications from the medications presently being used. Patient denies ongoing constipation, abdominal pain, nausea or vomiting.    PAST MEDICAL HISTORY:  Past Medical History  Diagnosis Date  . Hypertension   . Hyperlipidemia   . Parathyroid tumor   . Osteoarthritis of knee     bil-gets injections  . GERD (gastroesophageal reflux disease)   . Fibromyalgia     CURRENT MEDICATIONS: Reviewed per MAR/see medication list  Allergies  Allergen Reactions  . Atorvastatin Other (See Comments)    REACTION: leg cramp    REVIEW OF SYSTEMS:  GENERAL: no change in appetite, no fatigue, no weight changes, no fever, chills or  weakness RESPIRATORY: no cough, SOB, DOE, wheezing, hemoptysis CARDIAC: no chest pain, or palpitations, + edema GI: no abdominal pain, diarrhea, constipation, heart burn, nausea or vomiting  PHYSICAL EXAMINATION  GENERAL: no acute distress, normal body habitus EYES: conjunctivae normal, sclerae normal, normal eye lids NECK: supple, trachea midline, no neck masses, no thyroid tenderness, no thyromegaly LYMPHATICS: no LAN in the neck, no supraclavicular LAN RESPIRATORY: breathing is even & unlabored, BS CTAB CARDIAC: RRR, no murmur,no extra heart sounds, LLE edema 2+ GI: abdomen soft, normal BS, no masses, no tenderness, no hepatomegaly, no splenomegaly EXTREMITIES: Able to move 4 extremities PSYCHIATRIC: the patient is alert & oriented to person, affect & behavior appropriate  LABS/RADIOLOGY: Labs reviewed: Basic Metabolic Panel:  Recent Labs  06/22/14 0955 06/30/14 0527 07/01/14 0402  NA 142 141 141  K 4.4 4.2 4.1  CL 103 102 101  CO2 28 28 30   GLUCOSE 84 134* 119*  BUN 14 9 12   CREATININE 0.75 0.73 0.74  CALCIUM 11.0* 10.7* 10.8*   Liver Function Tests:  Recent Labs  06/22/14 0955  AST 17  ALT 12  ALKPHOS 109  BILITOT 0.7  PROT 8.0  ALBUMIN 4.5   CBC:  Recent Labs  06/30/14 0527 07/01/14 0402 07/02/14 0408  WBC 15.0* 18.4* 15.1*  HGB 11.5* 11.6* 11.3*  HCT 35.6* 36.6 35.2*  MCV 91.8 93.6 91.9  PLT 273 290 272    Dg Chest 2 View  06/22/2014   CLINICAL DATA:  Preoperative exam prior left knee surgery; nonsmoker; history of hypertension  EXAM: CHEST  2 VIEW  COMPARISON:  Chest x-ray  dated October 14, 2008  FINDINGS: The lungs are well-expanded and clear. The heart and pulmonary vascularity are within the limits of normal. There is no pleural effusion or pneumothorax. There is stable mid thoracic dextroscoliosis.  IMPRESSION: There is no active cardiopulmonary disease.   Electronically Signed   By: David  [place]   On: 06/22/2014 10:35     ASSESSMENT/PLAN:  Osteoarthritis S/P left total knee arthroplasty - for rehabilitation;  Continue Xarelto 10 mg by mouth daily 18 days then aspirin 81 mg by mouth daily 3 weeks for DVT prophylaxis; continue OxyIR 5 mg 1-2 tabs by mouth, tramadol 50 mg 1-2 tabs by mouth every 6 hours when necessary and Robaxin 500 mg by mouth every 6 hours when necessary for pain management.  Hypertension - well controlled; continue Avapro 300 mg by mouth daily and amlodipine 5 mg by mouth every morning  Allergic rhinitis - stable; continue Allegra 180 mg by mouth daily  GERD - stable; continue Protonix 40 mg by mouth daily  Hyperlipidemia - continue Crestor 10 mg by mouth every Monday, Wednesdays and Fridays  Constipation - stable; continue Colace 100 mg by mouth twice a day and MiraLAX 17 g by mouth daily when necessary  Anemia, acute blood loss - stable; hemoglobin 11.3    Spent 50 minutes in patient care.     [facility], NP [employer] [phone]

## 2014-07-14 NOTE — Progress Notes (Signed)
Patient ID: Alexandra Gibson, female   DOB: Dec 13, 1950, 63 y.o.   MRN: [id number]   07/14/14  Facility:  Nursing Home Location:  [place] Room Number: 308-P LEVEL OF CARE:  SNF (31)  Chief Complaint  Patient presents with  . Discharge Note     osteoarthritis status post left total knee arthroplasty, hypertension, allergic rhinitis, GERD, hyperlipidemia, constipation and anemia    HISTORY OF PRESENT ILLNESS:  This is a 63 year old female who is for discharge home with home health PT and OT.  DME: Rolling walker and bedside commode . She has been admitted to [facility] on 07/02/14 from [facility] with Osteoarthritis S/P left total knee arthroplasty. Patient was admitted to this facility for short-term rehabilitation after the patient's recent hospitalization.  Patient has completed SNF rehabilitation and therapy has cleared the patient for discharge.  REASSESSMENT OF ONGOING PROBLEMS:  HTN: Pt 's HTN remains stable.  Denies CP, sob, DOE, headaches, dizziness or visual disturbances.  No complications from the medications currently being used.  Last BP : 122/77   ANEMIA: The anemia has been stable. The patient denies fatigue, melena or hematochezia.  11/15 hgb 10.3  GERD: pt's GERD is stable.  Denies ongoing heartburn, abd. Pain, nausea or vomiting.  Currently on a PPI & tolerates it without any adverse reactions.  PAST MEDICAL HISTORY:  Past Medical History  Diagnosis Date  . Hypertension   . Hyperlipidemia   . Parathyroid tumor   . Osteoarthritis of knee     bil-gets injections  . GERD (gastroesophageal reflux disease)   . Fibromyalgia     CURRENT MEDICATIONS: Reviewed per MAR/see medication list  Allergies  Allergen Reactions  . Atorvastatin Other (See Comments)    REACTION: leg cramp    REVIEW OF SYSTEMS:  GENERAL: no change in appetite, no fatigue, no weight changes, no fever, chills or weakness RESPIRATORY: no cough, SOB,  DOE, wheezing, hemoptysis CARDIAC: no chest pain, or palpitations, + edema GI: no abdominal pain, diarrhea, constipation, heart burn, nausea or vomiting  PHYSICAL EXAMINATION  GENERAL: no acute distress, normal body habitus NECK: supple, trachea midline, no neck masses, no thyroid tenderness, no thyromegaly LYMPHATICS: no LAN in the neck, no supraclavicular LAN RESPIRATORY: breathing is even & unlabored, BS CTAB CARDIAC: RRR, no murmur,no extra heart sounds, LLE edema 2+ GI: abdomen soft, normal BS, no masses, no tenderness, no hepatomegaly, no splenomegaly EXTREMITIES: Able to move 4 extremities; ambulates with walker PSYCHIATRIC: the patient is alert & oriented to person, affect & behavior appropriate  LABS/RADIOLOGY: 07/07/14  WBC 12.1 hemoglobin 10.3 hematocrit 33.7 MCV 96.6 07/04/14  LLE venous Doppler ultrasound shows negative for DVT Labs reviewed: Basic Metabolic Panel:  Recent Labs  06/22/14 0955 06/30/14 0527 07/01/14 0402  NA 142 141 141  K 4.4 4.2 4.1  CL 103 102 101  CO2 28 28 30   GLUCOSE 84 134* 119*  BUN 14 9 12   CREATININE 0.75 0.73 0.74  CALCIUM 11.0* 10.7* 10.8*   Liver Function Tests:  Recent Labs  06/22/14 0955  AST 17  ALT 12  ALKPHOS 109  BILITOT 0.7  PROT 8.0  ALBUMIN 4.5   CBC:  Recent Labs  06/30/14 0527 07/01/14 0402 07/02/14 0408  WBC 15.0* 18.4* 15.1*  HGB 11.5* 11.6* 11.3*  HCT 35.6* 36.6 35.2*  MCV 91.8 93.6 91.9  PLT 273 290 272    Dg Chest 2 View  06/22/2014   CLINICAL DATA:  Preoperative  exam prior left knee surgery; nonsmoker; history of hypertension  EXAM: CHEST  2 VIEW  COMPARISON:  Chest x-ray dated October 14, 2008  FINDINGS: The lungs are well-expanded and clear. The heart and pulmonary vascularity are within the limits of normal. There is no pleural effusion or pneumothorax. There is stable mid thoracic dextroscoliosis.  IMPRESSION: There is no active cardiopulmonary disease.   Electronically Signed   By: David   [place]   On: 06/22/2014 10:35    ASSESSMENT/PLAN:  Osteoarthritis S/P left total knee arthroplasty - for home health PT and OT;  continue Xarelto 10 mg by mouth daily 6 more days then aspirin 81 mg by mouth daily 3 weeks for DVT prophylaxis; continue OxyIR 5 mg 1-2 tabs by mouth, tramadol 50 mg 1-2 tabs by mouth every 6 hours when necessary and Robaxin 500 mg by mouth every 6 hours when necessary for pain management.  Hypertension - well controlled; continue Avapro 300 mg by mouth daily and amlodipine 5 mg by mouth every morning  Allergic rhinitis - stable; continue Allegra 180 mg by mouth daily  GERD - stable; continue Protonix 40 mg by mouth daily  Hyperlipidemia - continue Crestor 10 mg by mouth every Monday, Wednesdays and Fridays  Constipation - stable; continue Colace 100 mg by mouth twice a day and MiraLAX 17 g by mouth daily when necessary  Anemia, acute blood loss - stable; hemoglobin 10.3   Leukocytosis - wbc 12.1; trending down   I have filled out patient's discharge paperwork and written prescriptions.  Patient will receive home health PT and OT.  DME provided:  Rolling walker and bedside commode  Total discharge time: Greater than 30 minutes  Discharge time involved coordination of the discharge process with Education officer, museum, nursing staff and therapy department. Medical justification for home health services/DME verified.      [facility], NP [employer] [phone]

## 2014-07-15 DIAGNOSIS — Z96652 Presence of left artificial knee joint: Secondary | ICD-10-CM | POA: Diagnosis not present

## 2014-07-15 DIAGNOSIS — I10 Essential (primary) hypertension: Secondary | ICD-10-CM

## 2014-07-15 DIAGNOSIS — Z471 Aftercare following joint replacement surgery: Secondary | ICD-10-CM | POA: Diagnosis not present

## 2014-07-15 DIAGNOSIS — F329 Major depressive disorder, single episode, unspecified: Secondary | ICD-10-CM

## 2014-07-15 DIAGNOSIS — M159 Polyosteoarthritis, unspecified: Secondary | ICD-10-CM | POA: Diagnosis not present

## 2014-07-15 DIAGNOSIS — G2581 Restless legs syndrome: Secondary | ICD-10-CM | POA: Diagnosis not present

## 2014-07-16 DIAGNOSIS — Z96652 Presence of left artificial knee joint: Secondary | ICD-10-CM | POA: Diagnosis not present

## 2014-07-16 DIAGNOSIS — Z471 Aftercare following joint replacement surgery: Secondary | ICD-10-CM | POA: Diagnosis not present

## 2014-07-16 DIAGNOSIS — F329 Major depressive disorder, single episode, unspecified: Secondary | ICD-10-CM | POA: Diagnosis not present

## 2014-07-16 DIAGNOSIS — M159 Polyosteoarthritis, unspecified: Secondary | ICD-10-CM | POA: Diagnosis not present

## 2014-07-16 DIAGNOSIS — I10 Essential (primary) hypertension: Secondary | ICD-10-CM | POA: Diagnosis not present

## 2014-07-16 DIAGNOSIS — G2581 Restless legs syndrome: Secondary | ICD-10-CM | POA: Diagnosis not present

## 2014-07-17 DIAGNOSIS — M159 Polyosteoarthritis, unspecified: Secondary | ICD-10-CM | POA: Diagnosis not present

## 2014-07-17 DIAGNOSIS — I10 Essential (primary) hypertension: Secondary | ICD-10-CM | POA: Diagnosis not present

## 2014-07-17 DIAGNOSIS — F329 Major depressive disorder, single episode, unspecified: Secondary | ICD-10-CM | POA: Diagnosis not present

## 2014-07-17 DIAGNOSIS — Z96652 Presence of left artificial knee joint: Secondary | ICD-10-CM | POA: Diagnosis not present

## 2014-07-17 DIAGNOSIS — G2581 Restless legs syndrome: Secondary | ICD-10-CM | POA: Diagnosis not present

## 2014-07-17 DIAGNOSIS — Z471 Aftercare following joint replacement surgery: Secondary | ICD-10-CM | POA: Diagnosis not present

## 2014-07-20 DIAGNOSIS — F329 Major depressive disorder, single episode, unspecified: Secondary | ICD-10-CM | POA: Diagnosis not present

## 2014-07-20 DIAGNOSIS — Z471 Aftercare following joint replacement surgery: Secondary | ICD-10-CM | POA: Diagnosis not present

## 2014-07-20 DIAGNOSIS — M159 Polyosteoarthritis, unspecified: Secondary | ICD-10-CM | POA: Diagnosis not present

## 2014-07-20 DIAGNOSIS — Z96652 Presence of left artificial knee joint: Secondary | ICD-10-CM | POA: Diagnosis not present

## 2014-07-20 DIAGNOSIS — G2581 Restless legs syndrome: Secondary | ICD-10-CM | POA: Diagnosis not present

## 2014-07-20 DIAGNOSIS — I10 Essential (primary) hypertension: Secondary | ICD-10-CM | POA: Diagnosis not present

## 2014-07-22 DIAGNOSIS — Z471 Aftercare following joint replacement surgery: Secondary | ICD-10-CM | POA: Diagnosis not present

## 2014-07-22 DIAGNOSIS — F329 Major depressive disorder, single episode, unspecified: Secondary | ICD-10-CM | POA: Diagnosis not present

## 2014-07-22 DIAGNOSIS — G2581 Restless legs syndrome: Secondary | ICD-10-CM | POA: Diagnosis not present

## 2014-07-22 DIAGNOSIS — I10 Essential (primary) hypertension: Secondary | ICD-10-CM | POA: Diagnosis not present

## 2014-07-22 DIAGNOSIS — Z96652 Presence of left artificial knee joint: Secondary | ICD-10-CM | POA: Diagnosis not present

## 2014-07-22 DIAGNOSIS — M159 Polyosteoarthritis, unspecified: Secondary | ICD-10-CM | POA: Diagnosis not present

## 2014-07-23 DIAGNOSIS — M159 Polyosteoarthritis, unspecified: Secondary | ICD-10-CM | POA: Diagnosis not present

## 2014-07-23 DIAGNOSIS — I10 Essential (primary) hypertension: Secondary | ICD-10-CM | POA: Diagnosis not present

## 2014-07-23 DIAGNOSIS — G2581 Restless legs syndrome: Secondary | ICD-10-CM | POA: Diagnosis not present

## 2014-07-23 DIAGNOSIS — F329 Major depressive disorder, single episode, unspecified: Secondary | ICD-10-CM | POA: Diagnosis not present

## 2014-07-23 DIAGNOSIS — Z96652 Presence of left artificial knee joint: Secondary | ICD-10-CM | POA: Diagnosis not present

## 2014-07-23 DIAGNOSIS — Z471 Aftercare following joint replacement surgery: Secondary | ICD-10-CM | POA: Diagnosis not present

## 2014-07-27 DIAGNOSIS — M1712 Unilateral primary osteoarthritis, left knee: Secondary | ICD-10-CM | POA: Diagnosis not present

## 2014-07-29 DIAGNOSIS — M1712 Unilateral primary osteoarthritis, left knee: Secondary | ICD-10-CM | POA: Diagnosis not present

## 2014-08-04 DIAGNOSIS — M1712 Unilateral primary osteoarthritis, left knee: Secondary | ICD-10-CM | POA: Diagnosis not present

## 2014-08-11 DIAGNOSIS — M1712 Unilateral primary osteoarthritis, left knee: Secondary | ICD-10-CM | POA: Diagnosis not present

## 2014-08-13 DIAGNOSIS — M1712 Unilateral primary osteoarthritis, left knee: Secondary | ICD-10-CM | POA: Diagnosis not present

## 2014-08-18 DIAGNOSIS — M1712 Unilateral primary osteoarthritis, left knee: Secondary | ICD-10-CM | POA: Diagnosis not present

## 2014-08-18 DIAGNOSIS — Z96652 Presence of left artificial knee joint: Secondary | ICD-10-CM | POA: Diagnosis not present

## 2014-08-18 DIAGNOSIS — Z471 Aftercare following joint replacement surgery: Secondary | ICD-10-CM | POA: Diagnosis not present

## 2014-08-20 DIAGNOSIS — M1712 Unilateral primary osteoarthritis, left knee: Secondary | ICD-10-CM | POA: Diagnosis not present

## 2014-08-25 DIAGNOSIS — M1712 Unilateral primary osteoarthritis, left knee: Secondary | ICD-10-CM | POA: Diagnosis not present

## 2014-08-27 DIAGNOSIS — M1712 Unilateral primary osteoarthritis, left knee: Secondary | ICD-10-CM | POA: Diagnosis not present

## 2014-09-01 DIAGNOSIS — M1712 Unilateral primary osteoarthritis, left knee: Secondary | ICD-10-CM | POA: Diagnosis not present

## 2014-09-03 DIAGNOSIS — M1712 Unilateral primary osteoarthritis, left knee: Secondary | ICD-10-CM | POA: Diagnosis not present

## 2014-09-08 DIAGNOSIS — M1712 Unilateral primary osteoarthritis, left knee: Secondary | ICD-10-CM | POA: Diagnosis not present

## 2014-09-10 DIAGNOSIS — M1712 Unilateral primary osteoarthritis, left knee: Secondary | ICD-10-CM | POA: Diagnosis not present

## 2014-09-15 DIAGNOSIS — M1712 Unilateral primary osteoarthritis, left knee: Secondary | ICD-10-CM | POA: Diagnosis not present

## 2014-09-17 DIAGNOSIS — M1712 Unilateral primary osteoarthritis, left knee: Secondary | ICD-10-CM | POA: Diagnosis not present

## 2014-09-22 DIAGNOSIS — M1712 Unilateral primary osteoarthritis, left knee: Secondary | ICD-10-CM | POA: Diagnosis not present

## 2014-09-24 DIAGNOSIS — M1712 Unilateral primary osteoarthritis, left knee: Secondary | ICD-10-CM | POA: Diagnosis not present

## 2014-09-29 DIAGNOSIS — M1712 Unilateral primary osteoarthritis, left knee: Secondary | ICD-10-CM | POA: Diagnosis not present

## 2014-09-30 DIAGNOSIS — Z01419 Encounter for gynecological examination (general) (routine) without abnormal findings: Secondary | ICD-10-CM | POA: Diagnosis not present

## 2014-09-30 DIAGNOSIS — Z124 Encounter for screening for malignant neoplasm of cervix: Secondary | ICD-10-CM | POA: Diagnosis not present

## 2014-09-30 DIAGNOSIS — R3915 Urgency of urination: Secondary | ICD-10-CM | POA: Diagnosis not present

## 2014-09-30 DIAGNOSIS — R32 Unspecified urinary incontinence: Secondary | ICD-10-CM | POA: Diagnosis not present

## 2014-09-30 DIAGNOSIS — Z1231 Encounter for screening mammogram for malignant neoplasm of breast: Secondary | ICD-10-CM | POA: Diagnosis not present

## 2014-10-01 DIAGNOSIS — M1712 Unilateral primary osteoarthritis, left knee: Secondary | ICD-10-CM | POA: Diagnosis not present

## 2014-10-06 DIAGNOSIS — E785 Hyperlipidemia, unspecified: Secondary | ICD-10-CM | POA: Diagnosis not present

## 2014-10-06 DIAGNOSIS — E210 Primary hyperparathyroidism: Secondary | ICD-10-CM | POA: Diagnosis not present

## 2014-10-06 DIAGNOSIS — M170 Bilateral primary osteoarthritis of knee: Secondary | ICD-10-CM | POA: Diagnosis not present

## 2014-10-06 DIAGNOSIS — I10 Essential (primary) hypertension: Secondary | ICD-10-CM | POA: Diagnosis not present

## 2014-10-26 DIAGNOSIS — M1612 Unilateral primary osteoarthritis, left hip: Secondary | ICD-10-CM | POA: Diagnosis not present

## 2014-11-18 DIAGNOSIS — M1612 Unilateral primary osteoarthritis, left hip: Secondary | ICD-10-CM | POA: Diagnosis not present

## 2014-12-21 DIAGNOSIS — M1711 Unilateral primary osteoarthritis, right knee: Secondary | ICD-10-CM | POA: Diagnosis not present

## 2014-12-21 DIAGNOSIS — Z96652 Presence of left artificial knee joint: Secondary | ICD-10-CM | POA: Diagnosis not present

## 2014-12-21 DIAGNOSIS — Z471 Aftercare following joint replacement surgery: Secondary | ICD-10-CM | POA: Diagnosis not present

## 2014-12-21 DIAGNOSIS — M1612 Unilateral primary osteoarthritis, left hip: Secondary | ICD-10-CM | POA: Diagnosis not present

## 2015-01-04 DIAGNOSIS — E785 Hyperlipidemia, unspecified: Secondary | ICD-10-CM | POA: Diagnosis not present

## 2015-01-04 DIAGNOSIS — I10 Essential (primary) hypertension: Secondary | ICD-10-CM | POA: Diagnosis not present

## 2015-01-04 DIAGNOSIS — K219 Gastro-esophageal reflux disease without esophagitis: Secondary | ICD-10-CM | POA: Diagnosis not present

## 2015-01-04 DIAGNOSIS — E210 Primary hyperparathyroidism: Secondary | ICD-10-CM | POA: Diagnosis not present

## 2015-04-06 DIAGNOSIS — E210 Primary hyperparathyroidism: Secondary | ICD-10-CM | POA: Diagnosis not present

## 2015-04-06 DIAGNOSIS — Z23 Encounter for immunization: Secondary | ICD-10-CM | POA: Diagnosis not present

## 2015-04-06 DIAGNOSIS — R7309 Other abnormal glucose: Secondary | ICD-10-CM | POA: Diagnosis not present

## 2015-04-06 DIAGNOSIS — I129 Hypertensive chronic kidney disease with stage 1 through stage 4 chronic kidney disease, or unspecified chronic kidney disease: Secondary | ICD-10-CM | POA: Diagnosis not present

## 2015-04-06 DIAGNOSIS — N182 Chronic kidney disease, stage 2 (mild): Secondary | ICD-10-CM | POA: Diagnosis not present

## 2015-04-15 DIAGNOSIS — M1711 Unilateral primary osteoarthritis, right knee: Secondary | ICD-10-CM | POA: Diagnosis not present

## 2015-04-15 DIAGNOSIS — M1612 Unilateral primary osteoarthritis, left hip: Secondary | ICD-10-CM | POA: Diagnosis not present

## 2015-05-26 DIAGNOSIS — M169 Osteoarthritis of hip, unspecified: Secondary | ICD-10-CM | POA: Diagnosis not present

## 2015-05-26 DIAGNOSIS — Z01818 Encounter for other preprocedural examination: Secondary | ICD-10-CM | POA: Diagnosis not present

## 2015-05-26 DIAGNOSIS — E0590 Thyrotoxicosis, unspecified without thyrotoxic crisis or storm: Secondary | ICD-10-CM | POA: Diagnosis not present

## 2015-05-26 DIAGNOSIS — I10 Essential (primary) hypertension: Secondary | ICD-10-CM | POA: Diagnosis not present

## 2015-05-27 DIAGNOSIS — M169 Osteoarthritis of hip, unspecified: Secondary | ICD-10-CM | POA: Diagnosis not present

## 2015-05-27 DIAGNOSIS — M797 Fibromyalgia: Secondary | ICD-10-CM | POA: Diagnosis not present

## 2015-05-27 DIAGNOSIS — M2550 Pain in unspecified joint: Secondary | ICD-10-CM | POA: Diagnosis not present

## 2015-07-15 ENCOUNTER — Ambulatory Visit: Payer: Self-pay | Admitting: Orthopedic Surgery

## 2015-07-15 NOTE — Progress Notes (Signed)
Preoperative surgical orders have been place into the Epic hospital system for [facility] on 07/15/2015, 12:06 PM  by Mickel Crow for surgery on 08-04-2015.  Preop Total Hip - Anterior Approach orders including IV Tylenol, and IV Decadron as long as there are no contraindications to the above medications. Arlee Muslim, PA-C

## 2015-07-20 ENCOUNTER — Ambulatory Visit: Payer: Self-pay | Admitting: Orthopedic Surgery

## 2015-07-20 NOTE — H&P (Signed)
Alexandra Gibson DOB: 10/10/1950 Married / Language: English / Race: American [place] or [place] Native Female Date of Admission:  08/04/2015 CC:  Left Hip Pain History of Present Illness The patient is a 64 year old female who comes in for a preoperative History and Physical. The patient is scheduled for a left total hip arthroplasty (anterior) to be performed by Dr. Dione Plover. Aluisio, MD at [facility] on 08-04-2015. The patient is a 64 year old female who presented for follow up of their hip. The patient is being followed for their left hip pain and osteoarthritis. They are months out from left IA hip injection. Symptoms reported include: pain, aching, stiffness and pain with weightbearing. The patient feels that they are doing poorly and report their pain level to be 10 / 10. Current treatment includes: modified weightbearing. The following medication has been used for pain control: Tylenol and Ultram. The patient has not gotten any relief of their symptoms with Cortisone injections. Her left knee is doing well at this time. She does have a lot of pain in the right knee, has documented arthritis. We have previously discussed replacing it; however, her left hip is that is bothering her even more. She has pain in groin radiating down the thigh. Occasionally, it go to her left knee. The knee itself has done very well post arthroplasty. She is really miserable with the left hip pain. She is going to get something done about the hip. They have been treated conservatively in the past for the above stated problem and despite conservative measures, they continue to have progressive pain and severe functional limitations and dysfunction. They have failed non-operative management including home exercise, medications, and injections. It is felt that they would benefit from undergoing total joint replacement. Risks and benefits of the procedure have been discussed with the patient and they elect to proceed with  surgery. There are no active contraindications to surgery such as ongoing infection or rapidly progressive neurological disease.  Problem List/Past Medical  BENIGN NEOPLASM OF PARATHYROID GLAND 12/13/2007 HYPERCHOLESTEROLEMIA  HYPERCALCEMIA  DEPRESSION, MAJOR, RECURRENT  RESTLESS LEGS SYNDROME (G25.81)  CARPAL TUNNEL SYNDROME, BILATERAL (G56.00)  HYPERTENSION GASTROESOPHAGEAL REFLUX, NO ESOPHAGITIS MENOPAUSAL  OSTEOARTHRITIS, MULTIPLE JOINTS  FIBROMYALGIA, FIBROMYOSITIS (M79.1)  Primary osteoarthritis of left hip (M16.12)  Status post total left knee replacement [id number])  Osteoporosis  Primary osteoarthritis of left knee (M17.12)   Allergies No Known Drug Allergies  Intolerance Atorvastatin Calcium *ANTIHYPERLIPIDEMICS*  myalgias  Family History Osteoarthritis  Mother. mother Hypertension  mother Congestive Heart Failure  father  Social History  Tobacco use  Never smoker. never smoker Alcohol use  never consumed alcohol Living situation  live alone Illicit drug use  no Exercise  Exercises rarely; does running / walking Pain Contract  no Number of flights of stairs before winded  less than 1 Marital status  divorced Drug/Alcohol Rehab (Previously)  no No alcohol use  Drug/Alcohol Rehab (Currently)  no Current work status  disabled Children  2 [place] with daughter and husband following Left Total Hip - Anterior  Medication History Tylenol 8 Hour (650MG  Tablet ER, Oral) Active. Vitamin D (Oral) Specific strength unknown - Active. Vitamin B-12 (Oral) Specific strength unknown - Active. Allergy Medication (Oral) Specific strength unknown - Active. Crestor (10MG  Tablet, Oral) Active. (3 times per week) TraMADol HCl (50MG  Tablet, Oral) Active. AmLODIPine Besylate (5MG  Tablet, Oral) Active. Irbesartan (300MG  Tablet, Oral) Active. Pantoprazole Sodium (40MG  Tablet DR, Oral) Active.  Past Surgical  History Appendectomy  Arthroscopy of Knee  left Total Knee Replacement - Left  Date: 06/2014.   Review of Systems General Not Present- Chills, Fatigue, Fever, Memory Loss, Night Sweats, Weight Gain and Weight Loss. Skin Not Present- Eczema, Hives, Itching, Lesions and Rash. HEENT Not Present- Dentures, Double Vision, Headache, Hearing Loss, Tinnitus and Visual Loss. Respiratory Not Present- Allergies, Chronic Cough, Coughing up blood, Shortness of breath at rest and Shortness of breath with exertion. Cardiovascular Not Present- Chest Pain, Difficulty Breathing Lying Down, Murmur, Palpitations, Racing/skipping heartbeats and Swelling. Gastrointestinal Present- Heartburn. Not Present- Abdominal Pain, Bloody Stool, Constipation, Diarrhea, Difficulty Swallowing, Jaundice, Loss of appetitie, Nausea and Vomiting. Female Genitourinary Not Present- Blood in Urine, Discharge, Flank Pain, Incontinence, Painful Urination, Urgency, Urinary frequency, Urinary Retention, Urinating at Night and Weak urinary stream. Musculoskeletal Present- Joint Pain, Joint Swelling, Morning Stiffness and Muscle Pain. Not Present- Back Pain, Muscle Weakness and Spasms. Neurological Not Present- Blackout spells, Difficulty with balance, Dizziness, Paralysis, Tremor and Weakness. Psychiatric Not Present- Insomnia.  Vitals Weight: 175 lb Height: 68.5in Weight was reported by patient. Height was reported by patient. Body Surface Area: 1.94 m Body Mass Index: 26.22 kg/m  BP: 142/88 (Sitting, Right Arm, Standard)   Physical Exam General Mental Status -Alert, cooperative and good historian. General Appearance-pleasant, Not in acute distress. Orientation-Oriented X3. Build & Nutrition-Well nourished and Well developed.  Head and Neck Head-normocephalic, atraumatic . Neck Global Assessment - supple, no bruit auscultated on the right, no bruit auscultated on the left.  Eye Vision-Wears  corrective lenses. Pupil - Bilateral-Regular and Round. Motion - Bilateral-EOMI.  ENMT Note: partial lower dentures   Chest and Lung Exam Auscultation Breath sounds - clear at anterior chest wall and clear at posterior chest wall. Adventitious sounds - No Adventitious sounds.  Cardiovascular Auscultation Rhythm - Regular rate and rhythm. Heart Sounds - S1 WNL and S2 WNL. Murmurs & Other Heart Sounds - Auscultation of the heart reveals - No Murmurs.  Abdomen Palpation/Percussion Tenderness - Abdomen is non-tender to palpation. Rigidity (guarding) - Abdomen is soft. Auscultation Auscultation of the abdomen reveals - Bowel sounds normal.  Female Genitourinary Note: Not done, not pertinent to present illness   Musculoskeletal Note: On exam, she is in no distress. Her left hip can be flexed to 90, no internal rotation, but 20 external rotation, 20 abduction.  RADIOGRAPHS Reviewed x-rays AP, pelvis and lateral of the left hip and she has got severe bone-on-bone arthritis with subchondral cystic formation.  Assessment & Plan Primary osteoarthritis of left hip (M16.12)  Note:Surgical Plans: Left Total Hip Replacement - Anterior Approach  Disposition: Home  PCP: Dr. Noah Delaine - Patient has been seen preoperatively and felt to be stable for surgery.  IV TXA  Anesthesia Issues: None  Signed electronically by Joelene Millin, III PA-C

## 2015-07-30 ENCOUNTER — Encounter (HOSPITAL_COMMUNITY)
Admission: RE | Admit: 2015-07-30 | Discharge: 2015-07-30 | Disposition: A | Discharge status: Home / Self Care | Payer: Federal, State, Local not specified - PPO | Source: Ambulatory Visit | Attending: Orthopedic Surgery | Admitting: Orthopedic Surgery

## 2015-07-30 ENCOUNTER — Encounter (HOSPITAL_COMMUNITY): Payer: Self-pay

## 2015-07-30 DIAGNOSIS — M1612 Unilateral primary osteoarthritis, left hip: Secondary | ICD-10-CM | POA: Insufficient documentation

## 2015-07-30 DIAGNOSIS — Z01812 Encounter for preprocedural laboratory examination: Secondary | ICD-10-CM | POA: Insufficient documentation

## 2015-07-30 HISTORY — DX: Presence of spectacles and contact lenses: Z97.3

## 2015-07-30 HISTORY — DX: Frequency of micturition: R35.0

## 2015-07-30 HISTORY — DX: Anesthesia of skin: R20.0

## 2015-07-30 HISTORY — DX: Personal history of urinary (tract) infections: Z87.440

## 2015-07-30 LAB — COMPREHENSIVE METABOLIC PANEL
ALBUMIN: 4.8 g/dL (ref 3.5–5.0)
ALT: 14 U/L (ref 14–54)
AST: 16 U/L (ref 15–41)
Alkaline Phosphatase: 91 U/L (ref 38–126)
Anion gap: 9 (ref 5–15)
BUN: 20 mg/dL (ref 6–20)
CHLORIDE: 105 mmol/L (ref 101–111)
CO2: 28 mmol/L (ref 22–32)
Calcium: 10.9 mg/dL — ABNORMAL HIGH (ref 8.9–10.3)
Creatinine, Ser: 0.78 mg/dL (ref 0.44–1.00)
GFR calc Af Amer: >60 mL/min (ref >60)
GFR calc non Af Amer: >60 mL/min (ref >60)
GLUCOSE: 93 mg/dL (ref 65–99)
POTASSIUM: 4.2 mmol/L (ref 3.5–5.1)
Sodium: 142 mmol/L (ref 135–145)
Total Bilirubin: 1.2 mg/dL (ref 0.3–1.2)
Total Protein: 7.9 g/dL (ref 6.5–8.1)

## 2015-07-30 LAB — TYPE AND SCREEN
ABO/RH(D): O POS
ANTIBODY SCREEN: NEGATIVE

## 2015-07-30 LAB — URINALYSIS, ROUTINE W REFLEX MICROSCOPIC
BILIRUBIN URINE: NEGATIVE
Glucose, UA: NEGATIVE mg/dL
Hgb urine dipstick: NEGATIVE
KETONES UR: NEGATIVE mg/dL
NITRITE: NEGATIVE
PH: 5.5 (ref 5.0–8.0)
Protein, ur: NEGATIVE mg/dL
Specific Gravity, Urine: 1.027 (ref 1.005–1.030)

## 2015-07-30 LAB — CBC
HEMATOCRIT: 41.5 % (ref 36.0–46.0)
Hemoglobin: 13.4 g/dL (ref 12.0–15.0)
MCH: 29.3 pg (ref 26.0–34.0)
MCHC: 32.3 g/dL (ref 30.0–36.0)
MCV: 90.8 fL (ref 78.0–100.0)
PLATELETS: 305 10*3/uL (ref 150–400)
RBC: 4.57 MIL/uL (ref 3.87–5.11)
RDW: 13.5 % (ref 11.5–15.5)
WBC: 9.8 10*3/uL (ref 4.0–10.5)

## 2015-07-30 LAB — SURGICAL PCR SCREEN
MRSA, PCR: NEGATIVE
Staphylococcus aureus: NEGATIVE

## 2015-07-30 LAB — URINE MICROSCOPIC-ADD ON

## 2015-07-30 LAB — PROTIME-INR
INR: 1.09 (ref 0.00–1.49)
Prothrombin Time: 14.3 seconds (ref 11.6–15.2)

## 2015-07-30 LAB — APTT: APTT: 31 s (ref 24–37)

## 2015-07-30 NOTE — Patient Instructions (Signed)
Ritamarie L Jhaveri  07/30/2015   Your procedure is scheduled on: Wednesday August 04, 2015   Report to [facility] Main  Entrance take [place]  elevators to 3rd floor to  [place] at 8:45 AM.  Call this number if you have problems the morning of surgery [phone]   Remember: ONLY 1 PERSON MAY GO WITH YOU TO SHORT STAY TO GET  READY MORNING OF [place].  Do not eat food or drink liquids :After Midnight.     Take these medicines the morning of surgery with A SIP OF WATER: Amlodipine (Norvasc); Fexofenadine (Allegra); Pantoprazole (Protonix)                               You may not have any metal on your body including hair pins and              piercings  Do not wear jewelry, make-up, lotions, powders or perfumes, deodorant             Do not wear nail polish.  Do not shave  48 hours prior to surgery.                Do not bring valuables to the hospital. [place].  Contacts, dentures or bridgework may not be worn into surgery.  Leave suitcase in the car. After surgery it may be brought to your room.              Please read over the following fact sheets you were given:INCENTIVE SPIROMETER; BLOOD TRANSFUSION INFORMATION SHEET  _____________________________________________________________________             [facility] - Preparing for Surgery Before surgery, you can play an important role.  Because skin is not sterile, your skin needs to be as free of germs as possible.  You can reduce the number of germs on your skin by washing with CHG (chlorahexidine gluconate) soap before surgery.  CHG is an antiseptic cleaner which kills germs and bonds with the skin to continue killing germs even after washing. Please DO NOT use if you have an allergy to CHG or antibacterial soaps.  If your skin becomes reddened/irritated stop using the CHG and inform your nurse when you arrive at Short Stay. Do not shave  (including legs and underarms) for at least 48 hours prior to the first CHG shower.  You may shave your face/neck. Please follow these instructions carefully:  1.  Shower with CHG Soap the night before surgery and the  morning of Surgery.  2.  If you choose to wash your hair, wash your hair first as usual with your  normal  shampoo.  3.  After you shampoo, rinse your hair and body thoroughly to remove the  shampoo.                           4.  Use CHG as you would any other liquid soap.  You can apply chg directly  to the skin and wash                       Gently with a scrungie or clean washcloth.  5.  Apply the CHG Soap to your body ONLY FROM THE NECK DOWN.   Do not use on face/ open                           Wound or open sores. Avoid contact with eyes, ears mouth and genitals (private parts).                       Wash face,  Genitals (private parts) with your normal soap.             6.  Wash thoroughly, paying special attention to the area where your surgery  will be performed.  7.  Thoroughly rinse your body with warm water from the neck down.  8.  DO NOT shower/wash with your normal soap after using and rinsing off  the CHG Soap.                9.  Pat yourself dry with a clean towel.            10.  Wear clean pajamas.            11.  Place clean sheets on your bed the night of your first shower and do not  sleep with pets. Day of Surgery : Do not apply any lotions/deodorants the morning of surgery.  Please wear clean clothes to the hospital/surgery center.  FAILURE TO FOLLOW THESE INSTRUCTIONS MAY RESULT IN THE CANCELLATION OF YOUR SURGERY PATIENT SIGNATURE_________________________________  NURSE SIGNATURE__________________________________  ________________________________________________________________________   Adam Phenix  An incentive spirometer is a tool that can help keep your lungs clear and active. This tool measures how well you are filling your lungs with  each breath. Taking long deep breaths may help reverse or decrease the chance of developing breathing (pulmonary) problems (especially infection) following:  A long period of time when you are unable to move or be active. BEFORE THE PROCEDURE   If the spirometer includes an indicator to show your best effort, your nurse or respiratory therapist will set it to a desired goal.  If possible, sit up straight or lean slightly forward. Try not to slouch.  Hold the incentive spirometer in an upright position. INSTRUCTIONS FOR USE   Sit on the edge of your bed if possible, or sit up as far as you can in bed or on a chair.  Hold the incentive spirometer in an upright position.  Breathe out normally.  Place the mouthpiece in your mouth and seal your lips tightly around it.  Breathe in slowly and as deeply as possible, raising the piston or the ball toward the top of the column.  Hold your breath for 3-5 seconds or for as long as possible. Allow the piston or ball to fall to the bottom of the column.  Remove the mouthpiece from your mouth and breathe out normally.  Rest for a few seconds and repeat Steps 1 through 7 at least 10 times every 1-2 hours when you are awake. Take your time and take a few normal breaths between deep breaths.  The spirometer may include an indicator to show your best effort. Use the indicator as a goal to work toward during each repetition.  After each set of 10 deep breaths, practice coughing to be sure your lungs are clear. If you have an incision (the cut made at the time of surgery), support your incision when coughing by placing a pillow or  rolled up towels firmly against it. Once you are able to get out of bed, walk around indoors and cough well. You may stop using the incentive spirometer when instructed by your caregiver.  RISKS AND COMPLICATIONS  Take your time so you do not get dizzy or light-headed.  If you are in pain, you may need to take or ask for  pain medication before doing incentive spirometry. It is harder to take a deep breath if you are having pain. AFTER USE  Rest and breathe slowly and easily.  It can be helpful to keep track of a log of your progress. Your caregiver can provide you with a simple table to help with this. If you are using the spirometer at home, follow these instructions: [place] IF:   You are having difficultly using the spirometer.  You have trouble using the spirometer as often as instructed.  Your pain medication is not giving enough relief while using the spirometer.  You develop fever of 100.5 F (38.1 C) or higher. SEEK IMMEDIATE MEDICAL CARE IF:   You cough up bloody sputum that had not been present before.  You develop fever of 102 F (38.9 C) or greater.  You develop worsening pain at or near the incision site. MAKE SURE YOU:   Understand these instructions.  Will watch your condition.  Will get help right away if you are not doing well or get worse. Document Released: 12/11/2006 Document Revised: 10/23/2011 Document Reviewed: 02/11/2007 ExitCare Patient Information 2014 ExitCare, [place].   ________________________________________________________________________  WHAT IS A BLOOD TRANSFUSION? Blood Transfusion Information  A transfusion is the replacement of blood or some of its parts. Blood is made up of multiple cells which provide different functions.  Red blood cells carry oxygen and are used for blood loss replacement.  White blood cells fight against infection.  Platelets control bleeding.  Plasma helps clot blood.  Other blood products are available for specialized needs, such as hemophilia or other clotting disorders. BEFORE THE TRANSFUSION  Who gives blood for transfusions?   Healthy volunteers who are fully evaluated to make sure their blood is safe. This is blood bank blood. Transfusion therapy is the safest it has ever been in the practice of medicine.  Before blood is taken from a donor, a complete history is taken to make sure that person has no history of diseases nor engages in risky social behavior (examples are intravenous drug use or sexual activity with multiple partners). The donor's travel history is screened to minimize risk of transmitting infections, such as malaria. The donated blood is tested for signs of infectious diseases, such as HIV and hepatitis. The blood is then tested to be sure it is compatible with you in order to minimize the chance of a transfusion reaction. If you or a relative donates blood, this is often done in anticipation of surgery and is not appropriate for emergency situations. It takes many days to process the donated blood. RISKS AND COMPLICATIONS Although transfusion therapy is very safe and saves many lives, the main dangers of transfusion include:   Getting an infectious disease.  Developing a transfusion reaction. This is an allergic reaction to something in the blood you were given. Every precaution is taken to prevent this. The decision to have a blood transfusion has been considered carefully by your caregiver before blood is given. Blood is not given unless the benefits outweigh the risks. AFTER THE TRANSFUSION  Right after receiving a blood transfusion, you will usually  feel much better and more energetic. This is especially true if your red blood cells have gotten low (anemic). The transfusion raises the level of the red blood cells which carry oxygen, and this usually causes an energy increase.  The nurse administering the transfusion will monitor you carefully for complications. HOME CARE INSTRUCTIONS  No special instructions are needed after a transfusion. You may find your energy is better. Speak with your caregiver about any limitations on activity for underlying diseases you may have. SEEK MEDICAL CARE IF:   Your condition is not improving after your transfusion.  You develop redness or  irritation at the intravenous (IV) site. SEEK IMMEDIATE MEDICAL CARE IF:  Any of the following symptoms occur over the next 12 hours:  Shaking chills.  You have a temperature by mouth above 102 F (38.9 C), not controlled by medicine.  Chest, back, or muscle pain.  People around you feel you are not acting correctly or are confused.  Shortness of breath or difficulty breathing.  Dizziness and fainting.  You get a rash or develop hives.  You have a decrease in urine output.  Your urine turns a dark color or changes to pink, red, or brown. Any of the following symptoms occur over the next 10 days:  You have a temperature by mouth above 102 F (38.9 C), not controlled by medicine.  Shortness of breath.  Weakness after normal activity.  The white part of the eye turns yellow (jaundice).  You have a decrease in the amount of urine or are urinating less often.  Your urine turns a dark color or changes to pink, red, or brown. Document Released: 07/28/2000 Document Revised: 10/23/2011 Document Reviewed: 03/16/2008 [facility] Patient Information 2014 [place], [place].  _______________________________________________________________________

## 2015-07-30 NOTE — Progress Notes (Signed)
Clearance note per chart per Dr Noah Delaine 05/26/2015  EKG per chart 05/16/2015

## 2015-08-04 ENCOUNTER — Encounter (HOSPITAL_COMMUNITY): Payer: Self-pay | Admitting: *Deleted

## 2015-08-04 ENCOUNTER — Inpatient Hospital Stay (HOSPITAL_COMMUNITY): Payer: Federal, State, Local not specified - PPO | Admitting: Certified Registered Nurse Anesthetist

## 2015-08-04 ENCOUNTER — Inpatient Hospital Stay (HOSPITAL_COMMUNITY): Payer: Federal, State, Local not specified - PPO

## 2015-08-04 ENCOUNTER — Inpatient Hospital Stay (HOSPITAL_COMMUNITY)
Admission: RE | Admit: 2015-08-04 | Discharge: 2015-08-06 | DRG: 470 | Disposition: A | Discharge status: Home Health | Payer: Federal, State, Local not specified - PPO | Source: Ambulatory Visit | Attending: Orthopedic Surgery | Admitting: Orthopedic Surgery

## 2015-08-04 ENCOUNTER — Encounter (HOSPITAL_COMMUNITY)
Admission: RE | Disposition: A | Discharge status: Home Health | Payer: Self-pay | Source: Ambulatory Visit | Attending: Orthopedic Surgery

## 2015-08-04 DIAGNOSIS — I10 Essential (primary) hypertension: Secondary | ICD-10-CM | POA: Diagnosis not present

## 2015-08-04 DIAGNOSIS — Z79899 Other long term (current) drug therapy: Secondary | ICD-10-CM | POA: Diagnosis not present

## 2015-08-04 DIAGNOSIS — M1612 Unilateral primary osteoarthritis, left hip: Secondary | ICD-10-CM | POA: Diagnosis not present

## 2015-08-04 DIAGNOSIS — M169 Osteoarthritis of hip, unspecified: Secondary | ICD-10-CM | POA: Diagnosis not present

## 2015-08-04 DIAGNOSIS — K219 Gastro-esophageal reflux disease without esophagitis: Secondary | ICD-10-CM | POA: Diagnosis present

## 2015-08-04 DIAGNOSIS — Z01812 Encounter for preprocedural laboratory examination: Secondary | ICD-10-CM

## 2015-08-04 DIAGNOSIS — M797 Fibromyalgia: Secondary | ICD-10-CM | POA: Diagnosis present

## 2015-08-04 DIAGNOSIS — M25552 Pain in left hip: Secondary | ICD-10-CM | POA: Diagnosis not present

## 2015-08-04 DIAGNOSIS — Z96652 Presence of left artificial knee joint: Secondary | ICD-10-CM | POA: Diagnosis present

## 2015-08-04 DIAGNOSIS — Z96649 Presence of unspecified artificial hip joint: Secondary | ICD-10-CM

## 2015-08-04 HISTORY — PX: TOTAL HIP ARTHROPLASTY: SHX124

## 2015-08-04 SURGERY — ARTHROPLASTY, HIP, TOTAL, ANTERIOR APPROACH
Anesthesia: General | Site: Hip | Laterality: Left

## 2015-08-04 MED ORDER — CYCLOBENZAPRINE HCL 10 MG PO TABS
10.0000 mg | ORAL_TABLET | Freq: Three times a day (TID) | ORAL | Status: DC | PRN
  Administered 2015-08-05: 10 mg via ORAL
  Filled 2015-08-04: qty 1

## 2015-08-04 MED ORDER — OXYCODONE HCL 5 MG PO TABS: 5.0000 mg | ORAL_TABLET | Freq: Once | ORAL | Status: DC | PRN

## 2015-08-04 MED ORDER — BUPIVACAINE HCL (PF) 0.25 % IJ SOLN
INTRAMUSCULAR | Status: AC
  Filled 2015-08-04: qty 30

## 2015-08-04 MED ORDER — BISACODYL 10 MG RE SUPP: 10.0000 mg | Freq: Every day | RECTAL | Status: DC | PRN

## 2015-08-04 MED ORDER — ONDANSETRON HCL 4 MG/2ML IJ SOLN: 4.0000 mg | Freq: Four times a day (QID) | INTRAMUSCULAR | Status: DC | PRN

## 2015-08-04 MED ORDER — ACETAMINOPHEN 650 MG RE SUPP: 650.0000 mg | Freq: Four times a day (QID) | RECTAL | Status: DC | PRN

## 2015-08-04 MED ORDER — DEXAMETHASONE SODIUM PHOSPHATE 10 MG/ML IJ SOLN
10.0000 mg | Freq: Once | INTRAMUSCULAR | Status: AC
  Administered 2015-08-04: 10 mg via INTRAVENOUS

## 2015-08-04 MED ORDER — LORATADINE 10 MG PO TABS
10.0000 mg | ORAL_TABLET | Freq: Every day | ORAL | Status: DC
  Administered 2015-08-05 – 2015-08-06 (×2): 10 mg via ORAL
  Filled 2015-08-04 (×2): qty 1

## 2015-08-04 MED ORDER — AMLODIPINE BESYLATE 5 MG PO TABS
5.0000 mg | ORAL_TABLET | Freq: Every day | ORAL | Status: DC
  Administered 2015-08-05 – 2015-08-06 (×2): 5 mg via ORAL
  Filled 2015-08-04 (×2): qty 1

## 2015-08-04 MED ORDER — POLYETHYLENE GLYCOL 3350 17 G PO PACK: 17.0000 g | PACK | Freq: Every day | ORAL | Status: DC | PRN

## 2015-08-04 MED ORDER — TRANEXAMIC ACID 1000 MG/10ML IV SOLN
1000.0000 mg | INTRAVENOUS | Status: AC
  Administered 2015-08-04: 1000 mg via INTRAVENOUS
  Filled 2015-08-04: qty 10

## 2015-08-04 MED ORDER — ONDANSETRON HCL 4 MG PO TABS
4.0000 mg | ORAL_TABLET | Freq: Four times a day (QID) | ORAL | Status: DC | PRN
  Administered 2015-08-06: 4 mg via ORAL
  Filled 2015-08-04: qty 1

## 2015-08-04 MED ORDER — FENTANYL CITRATE (PF) 100 MCG/2ML IJ SOLN
INTRAMUSCULAR | Status: AC
  Filled 2015-08-04: qty 2

## 2015-08-04 MED ORDER — ACETAMINOPHEN 10 MG/ML IV SOLN
INTRAVENOUS | Status: AC
  Filled 2015-08-04: qty 100

## 2015-08-04 MED ORDER — ONDANSETRON HCL 4 MG/2ML IJ SOLN
INTRAMUSCULAR | Status: AC
  Filled 2015-08-04: qty 2

## 2015-08-04 MED ORDER — PHENYLEPHRINE HCL 10 MG/ML IJ SOLN
INTRAMUSCULAR | Status: DC | PRN
  Administered 2015-08-04: 80 ug via INTRAVENOUS

## 2015-08-04 MED ORDER — MENTHOL 3 MG MT LOZG: 1.0000 | LOZENGE | OROMUCOSAL | Status: DC | PRN

## 2015-08-04 MED ORDER — DIPHENHYDRAMINE HCL 12.5 MG/5ML PO ELIX: 12.5000 mg | ORAL_SOLUTION | ORAL | Status: DC | PRN

## 2015-08-04 MED ORDER — LACTATED RINGERS IV SOLN
INTRAVENOUS | Status: DC | PRN
  Administered 2015-08-04: 11:00:00 via INTRAVENOUS
  Administered 2015-08-04: 1000 mL
  Administered 2015-08-04: 12:00:00 via INTRAVENOUS

## 2015-08-04 MED ORDER — SODIUM CHLORIDE 0.9 % IV SOLN: INTRAVENOUS | Status: DC

## 2015-08-04 MED ORDER — OXYCODONE HCL 5 MG PO TABS
5.0000 mg | ORAL_TABLET | ORAL | Status: DC | PRN
  Administered 2015-08-04 – 2015-08-05 (×3): 10 mg via ORAL
  Administered 2015-08-05: 5 mg via ORAL
  Administered 2015-08-05 – 2015-08-06 (×6): 10 mg via ORAL
  Filled 2015-08-04 (×2): qty 2
  Filled 2015-08-04: qty 1
  Filled 2015-08-04 (×7): qty 2

## 2015-08-04 MED ORDER — METHOCARBAMOL 1000 MG/10ML IJ SOLN
500.0000 mg | Freq: Four times a day (QID) | INTRAVENOUS | Status: DC | PRN
  Administered 2015-08-04: 500 mg via INTRAVENOUS
  Filled 2015-08-04 (×2): qty 5

## 2015-08-04 MED ORDER — FLEET ENEMA 7-19 GM/118ML RE ENEM: 1.0000 | ENEMA | Freq: Once | RECTAL | Status: DC | PRN

## 2015-08-04 MED ORDER — STERILE WATER FOR IRRIGATION IR SOLN
Status: DC | PRN
  Administered 2015-08-04: 1000 mL

## 2015-08-04 MED ORDER — PHENOL 1.4 % MT LIQD
1.0000 | OROMUCOSAL | Status: DC | PRN
  Filled 2015-08-04: qty 177

## 2015-08-04 MED ORDER — DEXAMETHASONE SODIUM PHOSPHATE 10 MG/ML IJ SOLN
10.0000 mg | Freq: Once | INTRAMUSCULAR | Status: AC
Start: 2015-08-05 — End: 2015-08-05
  Administered 2015-08-05: 10 mg via INTRAVENOUS
  Filled 2015-08-04: qty 1

## 2015-08-04 MED ORDER — OXYCODONE HCL 5 MG/5ML PO SOLN
5.0000 mg | Freq: Once | ORAL | Status: DC | PRN
Start: 2015-08-04 — End: 2015-08-04

## 2015-08-04 MED ORDER — FENTANYL CITRATE (PF) 100 MCG/2ML IJ SOLN
INTRAMUSCULAR | Status: DC | PRN
  Administered 2015-08-04: 100 ug via INTRAVENOUS
  Administered 2015-08-04 (×5): 50 ug via INTRAVENOUS
  Administered 2015-08-04: 100 ug via INTRAVENOUS

## 2015-08-04 MED ORDER — METHOCARBAMOL 500 MG PO TABS
500.0000 mg | ORAL_TABLET | Freq: Four times a day (QID) | ORAL | Status: DC | PRN
  Administered 2015-08-04 – 2015-08-06 (×4): 500 mg via ORAL
  Filled 2015-08-04 (×5): qty 1

## 2015-08-04 MED ORDER — BUPIVACAINE HCL (PF) 0.25 % IJ SOLN
INTRAMUSCULAR | Status: DC | PRN
  Administered 2015-08-04: 30 mL

## 2015-08-04 MED ORDER — GLYCOPYRROLATE 0.2 MG/ML IJ SOLN
INTRAMUSCULAR | Status: DC | PRN
  Administered 2015-08-04: 0.6 mg via INTRAVENOUS
  Administered 2015-08-04: 0.2 mg via INTRAVENOUS

## 2015-08-04 MED ORDER — ACETAMINOPHEN 500 MG PO TABS
1000.0000 mg | ORAL_TABLET | Freq: Four times a day (QID) | ORAL | Status: AC
  Administered 2015-08-04 – 2015-08-05 (×4): 1000 mg via ORAL
  Filled 2015-08-04 (×5): qty 2

## 2015-08-04 MED ORDER — PRAVASTATIN SODIUM 20 MG PO TABS
20.0000 mg | ORAL_TABLET | Freq: Every day | ORAL | Status: DC
  Administered 2015-08-05 – 2015-08-06 (×2): 20 mg via ORAL
  Filled 2015-08-04 (×2): qty 1

## 2015-08-04 MED ORDER — METOCLOPRAMIDE HCL 5 MG/ML IJ SOLN: 5.0000 mg | Freq: Three times a day (TID) | INTRAMUSCULAR | Status: DC | PRN

## 2015-08-04 MED ORDER — TRAMADOL HCL 50 MG PO TABS
50.0000 mg | ORAL_TABLET | Freq: Four times a day (QID) | ORAL | Status: DC | PRN
  Administered 2015-08-05 – 2015-08-06 (×2): 100 mg via ORAL
  Filled 2015-08-04 (×2): qty 2

## 2015-08-04 MED ORDER — HYDROMORPHONE HCL 1 MG/ML IJ SOLN
INTRAMUSCULAR | Status: AC
  Filled 2015-08-04: qty 1

## 2015-08-04 MED ORDER — FENTANYL CITRATE (PF) 250 MCG/5ML IJ SOLN
INTRAMUSCULAR | Status: AC
  Filled 2015-08-04: qty 5

## 2015-08-04 MED ORDER — CEFAZOLIN SODIUM-DEXTROSE 2-3 GM-% IV SOLR
2.0000 g | INTRAVENOUS | Status: AC
  Administered 2015-08-04: 2 g via INTRAVENOUS

## 2015-08-04 MED ORDER — ROCURONIUM BROMIDE 100 MG/10ML IV SOLN
INTRAVENOUS | Status: DC | PRN
  Administered 2015-08-04: 40 mg via INTRAVENOUS

## 2015-08-04 MED ORDER — ACETAMINOPHEN 325 MG PO TABS: 650.0000 mg | ORAL_TABLET | Freq: Four times a day (QID) | ORAL | Status: DC | PRN

## 2015-08-04 MED ORDER — HYDROMORPHONE HCL 1 MG/ML IJ SOLN
0.2500 mg | INTRAMUSCULAR | Status: DC | PRN
  Administered 2015-08-04 (×4): 0.5 mg via INTRAVENOUS

## 2015-08-04 MED ORDER — ACETAMINOPHEN 10 MG/ML IV SOLN
1000.0000 mg | Freq: Once | INTRAVENOUS | Status: AC
  Administered 2015-08-04: 1000 mg via INTRAVENOUS
  Filled 2015-08-04: qty 100

## 2015-08-04 MED ORDER — DEXTROSE-NACL 5-0.45 % IV SOLN
INTRAVENOUS | Status: DC
  Administered 2015-08-04: 17:00:00 via INTRAVENOUS

## 2015-08-04 MED ORDER — KETOROLAC TROMETHAMINE 15 MG/ML IJ SOLN: 7.5000 mg | Freq: Four times a day (QID) | INTRAMUSCULAR | Status: AC | PRN

## 2015-08-04 MED ORDER — CHLORHEXIDINE GLUCONATE 4 % EX LIQD: 60.0000 mL | Freq: Once | CUTANEOUS | Status: DC

## 2015-08-04 MED ORDER — MIDAZOLAM HCL 5 MG/5ML IJ SOLN
INTRAMUSCULAR | Status: DC | PRN
  Administered 2015-08-04: 2 mg via INTRAVENOUS

## 2015-08-04 MED ORDER — ONDANSETRON HCL 4 MG/2ML IJ SOLN: 4.0000 mg | Freq: Once | INTRAMUSCULAR | Status: DC | PRN

## 2015-08-04 MED ORDER — PANTOPRAZOLE SODIUM 40 MG PO TBEC
40.0000 mg | DELAYED_RELEASE_TABLET | Freq: Every day | ORAL | Status: DC
  Administered 2015-08-05 – 2015-08-06 (×2): 40 mg via ORAL
  Filled 2015-08-04 (×2): qty 1

## 2015-08-04 MED ORDER — 0.9 % SODIUM CHLORIDE (POUR BTL) OPTIME
TOPICAL | Status: DC | PRN
  Administered 2015-08-04: 1000 mL

## 2015-08-04 MED ORDER — PROPOFOL 10 MG/ML IV BOLUS
INTRAVENOUS | Status: AC
  Filled 2015-08-04: qty 20

## 2015-08-04 MED ORDER — IRBESARTAN 300 MG PO TABS
300.0000 mg | ORAL_TABLET | Freq: Every morning | ORAL | Status: DC
  Administered 2015-08-05 – 2015-08-06 (×2): 300 mg via ORAL
  Filled 2015-08-04 (×2): qty 1

## 2015-08-04 MED ORDER — ONDANSETRON HCL 4 MG/2ML IJ SOLN
INTRAMUSCULAR | Status: DC | PRN
  Administered 2015-08-04: 4 mg via INTRAVENOUS

## 2015-08-04 MED ORDER — RIVAROXABAN 10 MG PO TABS
10.0000 mg | ORAL_TABLET | Freq: Every day | ORAL | Status: DC
  Administered 2015-08-05 – 2015-08-06 (×2): 10 mg via ORAL
  Filled 2015-08-04 (×3): qty 1

## 2015-08-04 MED ORDER — LIDOCAINE HCL (CARDIAC) 20 MG/ML IV SOLN
INTRAVENOUS | Status: DC | PRN
  Administered 2015-08-04: 50 mg via INTRAVENOUS

## 2015-08-04 MED ORDER — MORPHINE SULFATE (PF) 2 MG/ML IV SOLN
1.0000 mg | INTRAVENOUS | Status: DC | PRN
  Administered 2015-08-04 – 2015-08-06 (×4): 1 mg via INTRAVENOUS
  Filled 2015-08-04 (×4): qty 1

## 2015-08-04 MED ORDER — GLYCOPYRROLATE 0.2 MG/ML IJ SOLN
INTRAMUSCULAR | Status: AC
  Filled 2015-08-04: qty 4

## 2015-08-04 MED ORDER — SUCCINYLCHOLINE CHLORIDE 20 MG/ML IJ SOLN
INTRAMUSCULAR | Status: DC | PRN
  Administered 2015-08-04: 80 mg via INTRAVENOUS

## 2015-08-04 MED ORDER — MIDAZOLAM HCL 2 MG/2ML IJ SOLN
INTRAMUSCULAR | Status: AC
  Filled 2015-08-04: qty 2

## 2015-08-04 MED ORDER — DOCUSATE SODIUM 100 MG PO CAPS
100.0000 mg | ORAL_CAPSULE | Freq: Two times a day (BID) | ORAL | Status: DC
  Administered 2015-08-04 – 2015-08-06 (×5): 100 mg via ORAL

## 2015-08-04 MED ORDER — FENTANYL CITRATE (PF) 100 MCG/2ML IJ SOLN
25.0000 ug | INTRAMUSCULAR | Status: DC | PRN
  Administered 2015-08-04 (×2): 50 ug via INTRAVENOUS

## 2015-08-04 MED ORDER — PROPOFOL 10 MG/ML IV BOLUS
INTRAVENOUS | Status: DC | PRN
  Administered 2015-08-04: 150 mg via INTRAVENOUS

## 2015-08-04 MED ORDER — CEFAZOLIN SODIUM-DEXTROSE 2-3 GM-% IV SOLR
2.0000 g | Freq: Four times a day (QID) | INTRAVENOUS | Status: AC
  Administered 2015-08-04 (×2): 2 g via INTRAVENOUS
  Filled 2015-08-04 (×2): qty 50

## 2015-08-04 MED ORDER — OXYCODONE HCL 5 MG/5ML PO SOLN
5.0000 mg | Freq: Once | ORAL | Status: DC | PRN
  Filled 2015-08-04: qty 5

## 2015-08-04 MED ORDER — METOCLOPRAMIDE HCL 10 MG PO TABS: 5.0000 mg | ORAL_TABLET | Freq: Three times a day (TID) | ORAL | Status: DC | PRN

## 2015-08-04 MED ORDER — NEOSTIGMINE METHYLSULFATE 10 MG/10ML IV SOLN
INTRAVENOUS | Status: DC | PRN
  Administered 2015-08-04: 4 mg via INTRAVENOUS

## 2015-08-04 MED ORDER — CEFAZOLIN SODIUM-DEXTROSE 2-3 GM-% IV SOLR
INTRAVENOUS | Status: AC
  Filled 2015-08-04: qty 50

## 2015-08-04 MED ORDER — FENTANYL CITRATE (PF) 100 MCG/2ML IJ SOLN
INTRAMUSCULAR | Status: AC
Start: 2015-08-04 — End: 2015-08-04
  Filled 2015-08-04: qty 2

## 2015-08-04 SURGICAL SUPPLY — 34 items
BAG DECANTER FOR FLEXI CONT (MISCELLANEOUS) ×1 IMPLANT
BAG SPEC THK2 15X12 ZIP CLS (MISCELLANEOUS)
BAG ZIPLOCK 12X15 (MISCELLANEOUS) IMPLANT
BLADE SAG 18X100X1.27 (BLADE) ×2 IMPLANT
CAPT HIP TOTAL 2 ×1 IMPLANT
CLOTH BEACON ORANGE TIMEOUT ST (SAFETY) ×2 IMPLANT
COVER PERINEAL POST (MISCELLANEOUS) ×2 IMPLANT
DECANTER SPIKE VIAL GLASS SM (MISCELLANEOUS) ×1 IMPLANT
DRAPE STERI IOBAN 125X83 (DRAPES) ×2 IMPLANT
DRAPE U-SHAPE 47X51 STRL (DRAPES) ×4 IMPLANT
DRSG ADAPTIC 3X8 NADH LF (GAUZE/BANDAGES/DRESSINGS) ×2 IMPLANT
DRSG MEPILEX BORDER 4X4 (GAUZE/BANDAGES/DRESSINGS) ×2 IMPLANT
DRSG MEPILEX BORDER 4X8 (GAUZE/BANDAGES/DRESSINGS) ×2 IMPLANT
DURAPREP 26ML APPLICATOR (WOUND CARE) ×2 IMPLANT
ELECT REM PT RETURN 9FT ADLT (ELECTROSURGICAL) ×2
ELECTRODE REM PT RTRN 9FT ADLT (ELECTROSURGICAL) ×1 IMPLANT
EVACUATOR 1/8 PVC DRAIN (DRAIN) ×2 IMPLANT
GLOVE BIO SURGEON STRL SZ7.5 (GLOVE) ×2 IMPLANT
GLOVE BIO SURGEON STRL SZ8 (GLOVE) ×3 IMPLANT
GLOVE BIOGEL PI IND STRL 8 (GLOVE) ×2 IMPLANT
GLOVE BIOGEL PI INDICATOR 8 (GLOVE) ×2
GOWN STRL REUS W/TWL LRG LVL3 (GOWN DISPOSABLE) ×2 IMPLANT
GOWN STRL REUS W/TWL XL LVL3 (GOWN DISPOSABLE) ×2 IMPLANT
PACK ANTERIOR HIP CUSTOM (KITS) ×2 IMPLANT
STRIP CLOSURE SKIN 1/2X4 (GAUZE/BANDAGES/DRESSINGS) ×2 IMPLANT
SUT ETHIBOND NAB CT1 #1 30IN (SUTURE) ×2 IMPLANT
SUT MNCRL AB 4-0 PS2 18 (SUTURE) ×2 IMPLANT
SUT VIC AB 2-0 CT1 27 (SUTURE) ×4
SUT VIC AB 2-0 CT1 TAPERPNT 27 (SUTURE) ×2 IMPLANT
SUT VLOC 180 0 24IN GS25 (SUTURE) ×2 IMPLANT
SYR 50ML LL SCALE MARK (SYRINGE) IMPLANT
TRAY FOLEY W/METER SILVER 14FR (SET/KITS/TRAYS/PACK) ×2 IMPLANT
TRAY FOLEY W/METER SILVER 16FR (SET/KITS/TRAYS/PACK) ×1 IMPLANT
YANKAUER SUCT BULB TIP 10FT TU (MISCELLANEOUS) ×2 IMPLANT

## 2015-08-04 NOTE — Transfer of Care (Signed)
Immediate Anesthesia Transfer of Care Note  Patient: Alexandra Gibson  Procedure(s) Performed: Procedure(s): TOTAL LEFT  HIP ARTHROPLASTY ANTERIOR APPROACH (Left)  Patient Location: PACU  Anesthesia Type:General  Level of Consciousness: awake, alert  and oriented  Airway & Oxygen Therapy: Patient Spontanous Breathing and Patient connected to face mask oxygen  Post-op Assessment: Report given to RN and Post -op Vital signs reviewed and stable  Post vital signs: Reviewed and stable  Last Vitals:  Filed Vitals:   08/04/15 0856  BP: 143/94  Pulse: 70  Temp: 36.8 C  Resp: 18    Complications: No apparent anesthesia complications

## 2015-08-04 NOTE — Anesthesia Procedure Notes (Signed)
Procedure Name: Intubation Performed by: Addelyn Alleman J Pre-anesthesia Checklist: Patient identified, Emergency Drugs available, Suction available, Patient being monitored and Timeout performed Patient Re-evaluated:Patient Re-evaluated prior to inductionOxygen Delivery Method: Circle system utilized Preoxygenation: Pre-oxygenation with 100% oxygen Intubation Type: IV induction Ventilation: Mask ventilation without difficulty Laryngoscope Size: Mac and 3 Grade View: Grade I Tube type: Oral Tube size: 7.0 mm Number of attempts: 1 Airway Equipment and Method: Stylet Placement Confirmation: ETT inserted through vocal cords under direct vision,  positive ETCO2,  CO2 detector and breath sounds checked- equal and bilateral Secured at: 21 cm Tube secured with: Tape Dental Injury: Teeth and Oropharynx as per pre-operative assessment        

## 2015-08-04 NOTE — Anesthesia Postprocedure Evaluation (Signed)
Anesthesia Post Note  Patient: Alexandra Gibson  Procedure(s) Performed: Procedure(s) (LRB): TOTAL LEFT  HIP ARTHROPLASTY ANTERIOR APPROACH (Left)  Patient location during evaluation: PACU Anesthesia Type: General Level of consciousness: awake and awake and alert Pain management: pain level controlled Respiratory status: spontaneous breathing and nonlabored ventilation Anesthetic complications: no    Last Vitals:  Filed Vitals:   08/04/15 1606 08/04/15 1744  BP: 145/81 135/82  Pulse: 95 96  Temp: 37.1 C 37.3 C  Resp: 18 20    Last Pain:  Filed Vitals:   08/04/15 1745  PainSc: 4                  [employer]

## 2015-08-04 NOTE — Interval H&P Note (Signed)
History and Physical Interval Note:  08/04/2015 11:02 AM  Alexandra Gibson  has presented today for surgery, with the diagnosis of OA LEFT HIP   The various methods of treatment have been discussed with the patient and family. After consideration of risks, benefits and other options for treatment, the patient has consented to  Procedure(s): TOTAL LEFT  HIP ARTHROPLASTY ANTERIOR APPROACH (Left) as a surgical intervention .  The patient's history has been reviewed, patient examined, no change in status, stable for surgery.  I have reviewed the patient's chart and labs.  Questions were answered to the patient's satisfaction.     Gearlean Alf

## 2015-08-04 NOTE — Op Note (Signed)

## 2015-08-04 NOTE — Evaluation (Signed)
Physical Therapy Evaluation Patient Details Name: Alexandra Gibson MRN: [id number] DOB: 08/03/1951 Today's Date: 08/04/2015   History of Present Illness  s/p L THR; s/p L TKR 11/15  Clinical Impression  Pt s/p L THR presents with decreased L LE strength/ROM and post op pain limiting functional mobility.  Pt should progress to dc home with family assist and HHPT follow up.    Follow Up Recommendations Home health PT    Equipment Recommendations  None recommended by PT    Recommendations for Other Services OT consult     Precautions / Restrictions Precautions Precautions: Fall Restrictions Weight Bearing Restrictions: No Other Position/Activity Restrictions: WBAT      Mobility  Bed Mobility Overal bed mobility: Needs Assistance;+2 for physical assistance Bed Mobility: Supine to Sit;Sit to Supine     Supine to sit: Mod assist;+2 for physical assistance;+2 for safety/equipment Sit to supine: Mod assist;+2 for physical assistance;+2 for safety/equipment   General bed mobility comments: cues for sequence and use of R LE to self assist  Transfers Overall transfer level: Needs assistance Equipment used: Rolling walker (2 wheeled) Transfers: Sit to/from Stand Sit to Stand: Min assist;Mod assist;+2 physical assistance;+2 safety/equipment;From elevated surface         General transfer comment: cues for LE management and use of UEs to self assist  Ambulation/Gait Ambulation/Gait assistance: Min assist;Mod assist;+2 safety/equipment Ambulation Distance (Feet): 7 Feet Assistive device: Rolling walker (2 wheeled) Gait Pattern/deviations: Step-to pattern;Decreased step length - right;Decreased step length - left;Shuffle;Trunk flexed Gait velocity: decr Gait velocity interpretation: Below normal speed for age/gender General Gait Details: cues for sequence, posture and position from RW.  Physical assist to advance L LE  Stairs            Wheelchair Mobility     Modified Rankin (Stroke Patients Only)       Balance                                             Pertinent Vitals/Pain Pain Assessment: 0-10 Pain Score: 7  Pain Location: L hip Pain Descriptors / Indicators: Aching;Sore Pain Intervention(s): Limited activity within patient's tolerance;Monitored during session;Premedicated before session;Ice applied    Home Living Family/patient expects to be discharged to:: Private residence Living Arrangements: Spouse/significant other Available Help at Discharge: Family Type of Home: House Home Access: Stairs to enter   Technical brewer of Steps: 3 Home Layout: One level Home Equipment: Environmental consultant - 2 wheels      Prior Function Level of Independence: Independent with assistive device(s)         Comments: using RW     Hand Dominance        Extremity/Trunk Assessment   Upper Extremity Assessment: Overall WFL for tasks assessed           Lower Extremity Assessment: LLE deficits/detail      Cervical / Trunk Assessment: Normal  Communication   Communication: No difficulties  Cognition Arousal/Alertness: Awake/alert Behavior During Therapy: WFL for tasks assessed/performed Overall Cognitive Status: Within Functional Limits for tasks assessed                      General Comments      Exercises        Assessment/Plan    PT Assessment Patient needs continued PT services  PT Diagnosis Difficulty walking   PT Problem List  Decreased strength;Decreased range of motion;Decreased activity tolerance;Decreased mobility;Decreased knowledge of use of DME;Pain  PT Treatment Interventions DME instruction;Gait training;Stair training;Functional mobility training;Therapeutic activities;Therapeutic exercise;Patient/family education   PT Goals (Current goals can be found in the Care Plan section) Acute Rehab PT Goals Patient Stated Goal: Regain IND PT Goal Formulation: With patient Time For  Goal Achievement: 08/09/15 Potential to Achieve Goals: Good    Frequency 7X/week   Barriers to discharge        Co-evaluation               End of Session Equipment Utilized During Treatment: Gait belt Activity Tolerance: Patient limited by fatigue;Patient limited by pain Patient left: in bed;with call bell/phone within reach;with family/visitor present Nurse Communication: Mobility status         Time: [id number] PT Time Calculation (min) (ACUTE ONLY): 23 min   Charges:   PT Evaluation $Initial PT Evaluation Tier I: 1 Procedure PT Treatments $Gait Training: 8-22 mins   PT G Codes:        Damon Baisch Aug 05, 2015, 5:35 PM

## 2015-08-04 NOTE — Anesthesia Preprocedure Evaluation (Signed)
Anesthesia Evaluation  Patient identified by MRN, date of birth, ID band Patient awake    Reviewed: Allergy & Precautions, NPO status , Patient's Chart, lab work & pertinent test results  Airway Mallampati: II  TM Distance: >3 FB Neck ROM: Full    Dental  (+) Teeth Intact, Partial Lower, Dental Advisory Given   Pulmonary    breath sounds clear to auscultation       Cardiovascular hypertension,  Rhythm:Regular Rate:Normal     Neuro/Psych    GI/Hepatic   Endo/Other    Renal/GU      Musculoskeletal   Abdominal   Peds  Hematology   Anesthesia Other Findings   Reproductive/Obstetrics                             Anesthesia Physical Anesthesia Plan  ASA: III  Anesthesia Plan: General   Post-op Pain Management:    Induction: Intravenous  Airway Management Planned: Oral ETT  Additional Equipment:   Intra-op Plan:   Post-operative Plan: Extubation in OR  Informed Consent: I have reviewed the patients History and Physical, chart, labs and discussed the procedure including the risks, benefits and alternatives for the proposed anesthesia with the patient or authorized representative who has indicated his/her understanding and acceptance.   Dental advisory given  Plan Discussed with: CRNA and Anesthesiologist  Anesthesia Plan Comments:         Anesthesia Quick Evaluation

## 2015-08-04 NOTE — H&P (View-Only) (Signed)
Alexandra Gibson DOB: 06-Dec-1950 Married / Language: English / Race: American [place] or [place] Native Female Date of Admission:  08/04/2015 CC:  Left Hip Pain History of Present Illness The patient is a 64 year old female who comes in for a preoperative History and Physical. The patient is scheduled for a left total hip arthroplasty (anterior) to be performed by Dr. Dione Plover. Aluisio, MD at [facility] on 08-04-2015. The patient is a 64 year old female who presented for follow up of their hip. The patient is being followed for their left hip pain and osteoarthritis. They are months out from left IA hip injection. Symptoms reported include: pain, aching, stiffness and pain with weightbearing. The patient feels that they are doing poorly and report their pain level to be 10 / 10. Current treatment includes: modified weightbearing. The following medication has been used for pain control: Tylenol and Ultram. The patient has not gotten any relief of their symptoms with Cortisone injections. Her left knee is doing well at this time. She does have a lot of pain in the right knee, has documented arthritis. We have previously discussed replacing it; however, her left hip is that is bothering her even more. She has pain in groin radiating down the thigh. Occasionally, it go to her left knee. The knee itself has done very well post arthroplasty. She is really miserable with the left hip pain. She is going to get something done about the hip. They have been treated conservatively in the past for the above stated problem and despite conservative measures, they continue to have progressive pain and severe functional limitations and dysfunction. They have failed non-operative management including home exercise, medications, and injections. It is felt that they would benefit from undergoing total joint replacement. Risks and benefits of the procedure have been discussed with the patient and they elect to proceed with  surgery. There are no active contraindications to surgery such as ongoing infection or rapidly progressive neurological disease.  Problem List/Past Medical  BENIGN NEOPLASM OF PARATHYROID GLAND 12/13/2007 HYPERCHOLESTEROLEMIA  HYPERCALCEMIA  DEPRESSION, MAJOR, RECURRENT  RESTLESS LEGS SYNDROME (G25.81)  CARPAL TUNNEL SYNDROME, BILATERAL (G56.00)  HYPERTENSION GASTROESOPHAGEAL REFLUX, NO ESOPHAGITIS MENOPAUSAL  OSTEOARTHRITIS, MULTIPLE JOINTS  FIBROMYALGIA, FIBROMYOSITIS (M79.1)  Primary osteoarthritis of left hip (M16.12)  Status post total left knee replacement [id number])  Osteoporosis  Primary osteoarthritis of left knee (M17.12)   Allergies No Known Drug Allergies  Intolerance Atorvastatin Calcium *ANTIHYPERLIPIDEMICS*  myalgias  Family History Osteoarthritis  Mother. mother Hypertension  mother Congestive Heart Failure  father  Social History  Tobacco use  Never smoker. never smoker Alcohol use  never consumed alcohol Living situation  live alone Illicit drug use  no Exercise  Exercises rarely; does running / walking Pain Contract  no Number of flights of stairs before winded  less than 1 Marital status  divorced Drug/Alcohol Rehab (Previously)  no No alcohol use  Drug/Alcohol Rehab (Currently)  no Current work status  disabled Children  2 [place] with daughter and husband following Left Total Hip - Anterior  Medication History Tylenol 8 Hour (650MG  Tablet ER, Oral) Active. Vitamin D (Oral) Specific strength unknown - Active. Vitamin B-12 (Oral) Specific strength unknown - Active. Allergy Medication (Oral) Specific strength unknown - Active. Crestor (10MG  Tablet, Oral) Active. (3 times per week) TraMADol HCl (50MG  Tablet, Oral) Active. AmLODIPine Besylate (5MG  Tablet, Oral) Active. Irbesartan (300MG  Tablet, Oral) Active. Pantoprazole Sodium (40MG  Tablet DR, Oral) Active.  Past Surgical  History Appendectomy  Arthroscopy of Knee  left Total Knee Replacement - Left  Date: 06/2014.   Review of Systems General Not Present- Chills, Fatigue, Fever, Memory Loss, Night Sweats, Weight Gain and Weight Loss. Skin Not Present- Eczema, Hives, Itching, Lesions and Rash. HEENT Not Present- Dentures, Double Vision, Headache, Hearing Loss, Tinnitus and Visual Loss. Respiratory Not Present- Allergies, Chronic Cough, Coughing up blood, Shortness of breath at rest and Shortness of breath with exertion. Cardiovascular Not Present- Chest Pain, Difficulty Breathing Lying Down, Murmur, Palpitations, Racing/skipping heartbeats and Swelling. Gastrointestinal Present- Heartburn. Not Present- Abdominal Pain, Bloody Stool, Constipation, Diarrhea, Difficulty Swallowing, Jaundice, Loss of appetitie, Nausea and Vomiting. Female Genitourinary Not Present- Blood in Urine, Discharge, Flank Pain, Incontinence, Painful Urination, Urgency, Urinary frequency, Urinary Retention, Urinating at Night and Weak urinary stream. Musculoskeletal Present- Joint Pain, Joint Swelling, Morning Stiffness and Muscle Pain. Not Present- Back Pain, Muscle Weakness and Spasms. Neurological Not Present- Blackout spells, Difficulty with balance, Dizziness, Paralysis, Tremor and Weakness. Psychiatric Not Present- Insomnia.  Vitals Weight: 175 lb Height: 68.5in Weight was reported by patient. Height was reported by patient. Body Surface Area: 1.94 m Body Mass Index: 26.22 kg/m  BP: 142/88 (Sitting, Right Arm, Standard)   Physical Exam General Mental Status -Alert, cooperative and good historian. General Appearance-pleasant, Not in acute distress. Orientation-Oriented X3. Build & Nutrition-Well nourished and Well developed.  Head and Neck Head-normocephalic, atraumatic . Neck Global Assessment - supple, no bruit auscultated on the right, no bruit auscultated on the left.  Eye Vision-Wears  corrective lenses. Pupil - Bilateral-Regular and Round. Motion - Bilateral-EOMI.  ENMT Note: partial lower dentures   Chest and Lung Exam Auscultation Breath sounds - clear at anterior chest wall and clear at posterior chest wall. Adventitious sounds - No Adventitious sounds.  Cardiovascular Auscultation Rhythm - Regular rate and rhythm. Heart Sounds - S1 WNL and S2 WNL. Murmurs & Other Heart Sounds - Auscultation of the heart reveals - No Murmurs.  Abdomen Palpation/Percussion Tenderness - Abdomen is non-tender to palpation. Rigidity (guarding) - Abdomen is soft. Auscultation Auscultation of the abdomen reveals - Bowel sounds normal.  Female Genitourinary Note: Not done, not pertinent to present illness   Musculoskeletal Note: On exam, she is in no distress. Her left hip can be flexed to 90, no internal rotation, but 20 external rotation, 20 abduction.  RADIOGRAPHS Reviewed x-rays AP, pelvis and lateral of the left hip and she has got severe bone-on-bone arthritis with subchondral cystic formation.  Assessment & Plan Primary osteoarthritis of left hip (M16.12)  Note:Surgical Plans: Left Total Hip Replacement - Anterior Approach  Disposition: Home  PCP: Dr. Noah Delaine - Patient has been seen preoperatively and felt to be stable for surgery.  IV TXA  Anesthesia Issues: None  Signed electronically by Joelene Millin, III PA-C

## 2015-08-05 ENCOUNTER — Encounter (HOSPITAL_COMMUNITY): Payer: Self-pay | Admitting: Orthopedic Surgery

## 2015-08-05 LAB — BASIC METABOLIC PANEL
ANION GAP: 7 (ref 5–15)
BUN: 12 mg/dL (ref 6–20)
CHLORIDE: 105 mmol/L (ref 101–111)
CO2: 28 mmol/L (ref 22–32)
CREATININE: 0.77 mg/dL (ref 0.44–1.00)
Calcium: 10.7 mg/dL — ABNORMAL HIGH (ref 8.9–10.3)
GFR calc non Af Amer: >60 mL/min (ref >60)
Glucose, Bld: 142 mg/dL — ABNORMAL HIGH (ref 65–99)
POTASSIUM: 4.5 mmol/L (ref 3.5–5.1)
Sodium: 140 mmol/L (ref 135–145)

## 2015-08-05 LAB — CBC
HCT: 37.3 % (ref 36.0–46.0)
Hemoglobin: 12.2 g/dL (ref 12.0–15.0)
MCH: 29.8 pg (ref 26.0–34.0)
MCHC: 32.7 g/dL (ref 30.0–36.0)
MCV: 91 fL (ref 78.0–100.0)
PLATELETS: 299 10*3/uL (ref 150–400)
RBC: 4.1 MIL/uL (ref 3.87–5.11)
RDW: 13.3 % (ref 11.5–15.5)
WBC: 18.2 10*3/uL — AB (ref 4.0–10.5)

## 2015-08-05 NOTE — Discharge Instructions (Addendum)
° °Dr. Frank Aluisio °Total Joint Specialist °[facility] °[address] °[place], [place] [address] °[phone] ° °ANTERIOR APPROACH TOTAL HIP REPLACEMENT POSTOPERATIVE DIRECTIONS ° ° °Hip Rehabilitation, Guidelines Following Surgery  °The results of a hip operation are greatly improved after range of motion and muscle strengthening exercises. Follow all safety measures which are given to protect your hip. If any of these exercises cause increased pain or swelling in your joint, decrease the amount until you are comfortable again. Then slowly increase the exercises. Call your caregiver if you have problems or questions.  ° °HOME CARE INSTRUCTIONS  °Remove items at home which could result in a fall. This includes throw rugs or furniture in walking pathways.  °· ICE to the affected hip every three hours for 30 minutes at a time and then as needed for pain and swelling.  Continue to use ice on the hip for pain and swelling from surgery. You may notice swelling that will progress down to the foot and ankle.  This is normal after surgery.  Elevate the leg when you are not up walking on it.   °· Continue to use the breathing machine which will help keep your temperature down.  It is common for your temperature to cycle up and down following surgery, especially at night when you are not up moving around and exerting yourself.  The breathing machine keeps your lungs expanded and your temperature down. ° ° °DIET °You may resume your previous home diet once your are discharged from the hospital. ° °DRESSING / WOUND CARE / SHOWERING °You may shower 3 days after surgery, but keep the wounds dry during showering.  You may use an occlusive plastic wrap (Press'n Seal for example), NO SOAKING/SUBMERGING IN THE BATHTUB.  If the bandage gets wet, change with a clean dry gauze.  If the incision gets wet, pat the wound dry with a clean towel. °You may start showering once you are discharged home but do not  submerge the incision under water. Just pat the incision dry and apply a dry gauze dressing on daily. °Change the surgical dressing daily and reapply a dry dressing each time. ° °ACTIVITY °Walk with your walker as instructed. °Use walker as long as suggested by your caregivers. °Avoid periods of inactivity such as sitting longer than an hour when not asleep. This helps prevent blood clots.  °You may resume a sexual relationship in one month or when given the OK by your doctor.  °You may return to work once you are cleared by your doctor.  °Do not drive a car for 6 weeks or until released by you surgeon.  °Do not drive while taking narcotics. ° °WEIGHT BEARING °Weight bearing as tolerated with assist device (walker, cane, etc) as directed, use it as long as suggested by your surgeon or therapist, typically at least 4-6 weeks. ° °POSTOPERATIVE CONSTIPATION PROTOCOL °Constipation - defined medically as fewer than three stools per week and severe constipation as less than one stool per week. ° °One of the most common issues patients have following surgery is constipation.  Even if you have a regular bowel pattern at home, your normal regimen is likely to be disrupted due to multiple reasons following surgery.  Combination of anesthesia, postoperative narcotics, change in appetite and fluid intake all can affect your bowels.  In order to avoid complications following surgery, here are some recommendations in order to help you during your recovery period. ° °Colace (docusate) - Pick up an over-the-counter   form of Colace or another stool softener and take twice a day as long as you are requiring postoperative pain medications.  Take with a full glass of water daily.  If you experience loose stools or diarrhea, hold the colace until you stool forms back up.  If your symptoms do not get better within 1 week or if they get worse, check with your doctor. ° °Dulcolax (bisacodyl) - Pick up over-the-counter and take as directed  by the product packaging as needed to assist with the movement of your bowels.  Take with a full glass of water.  Use this product as needed if not relieved by Colace only.  ° °MiraLax (polyethylene glycol) - Pick up over-the-counter to have on hand.  MiraLax is a solution that will increase the amount of water in your bowels to assist with bowel movements.  Take as directed and can mix with a glass of water, juice, soda, coffee, or tea.  Take if you go more than two days without a movement. °Do not use MiraLax more than once per day. Call your doctor if you are still constipated or irregular after using this medication for 7 days in a row. ° °If you continue to have problems with postoperative constipation, please contact the office for further assistance and recommendations.  If you experience "the worst abdominal pain ever" or develop nausea or vomiting, please contact the office immediatly for further recommendations for treatment. ° °ITCHING ° If you experience itching with your medications, try taking only a single pain pill, or even half a pain pill at a time.  You can also use Benadryl over the counter for itching or also to help with sleep.  ° °TED HOSE STOCKINGS °Wear the elastic stockings on both legs for three weeks following surgery during the day but you may remove then at night for sleeping. ° °MEDICATIONS °See your medication summary on the “After Visit Summary” that the nursing staff will review with you prior to discharge.  You may have some home medications which will be placed on hold until you complete the course of blood thinner medication.  It is important for you to complete the blood thinner medication as prescribed by your surgeon.  Continue your approved medications as instructed at time of discharge. ° °PRECAUTIONS °If you experience chest pain or shortness of breath - call 911 immediately for transfer to the hospital emergency department.  °If you develop a fever greater that 101 F,  purulent drainage from wound, increased redness or drainage from wound, foul odor from the wound/dressing, or calf pain - CONTACT YOUR SURGEON.   °                                                °FOLLOW-UP APPOINTMENTS °Make sure you keep all of your appointments after your operation with your surgeon and caregivers. You should call the office at the above phone number and make an appointment for approximately two weeks after the date of your surgery or on the date instructed by your surgeon outlined in the "After Visit Summary". ° °RANGE OF MOTION AND STRENGTHENING EXERCISES  °These exercises are designed to help you keep full movement of your hip joint. Follow your caregiver's or physical therapist's instructions. Perform all exercises about fifteen times, three times per day or as directed. Exercise both hips, even if you   have had only one joint replacement. These exercises can be done on a training (exercise) mat, on the floor, on a table or on a bed. Use whatever works the best and is most comfortable for you. Use music or television while you are exercising so that the exercises are a pleasant break in your day. This will make your life better with the exercises acting as a break in routine you can look forward to.  °Lying on your back, slowly slide your foot toward your buttocks, raising your knee up off the floor. Then slowly slide your foot back down until your leg is straight again.  °Lying on your back spread your legs as far apart as you can without causing discomfort.  °Lying on your side, raise your upper leg and foot straight up from the floor as far as is comfortable. Slowly lower the leg and repeat.  °Lying on your back, tighten up the muscle in the front of your thigh (quadriceps muscles). You can do this by keeping your leg straight and trying to raise your heel off the floor. This helps strengthen the largest muscle supporting your knee.  °Lying on your back, tighten up the muscles of your  buttocks both with the legs straight and with the knee bent at a comfortable angle while keeping your heel on the floor.  ° °IF YOU ARE TRANSFERRED TO A SKILLED REHAB FACILITY °If the patient is transferred to a skilled rehab facility following release from the hospital, a list of the current medications will be sent to the facility for the patient to continue.  When discharged from the skilled rehab facility, please have the facility set up the patient's Home Health Physical Therapy prior to being released. Also, the skilled facility will be responsible for providing the patient with their medications at time of release from the facility to include their pain medication, the muscle relaxants, and their blood thinner medication. If the patient is still at the rehab facility at time of the two week follow up appointment, the skilled rehab facility will also need to assist the patient in arranging follow up appointment in our office and any transportation needs. ° °MAKE SURE YOU:  °Understand these instructions.  °Get help right away if you are not doing well or get worse.  ° ° °Pick up stool softner and laxative for home use following surgery while on pain medications. °Do not submerge incision under water. °Please use good hand washing techniques while changing dressing each day. °May shower starting three days after surgery. °Please use a clean towel to pat the incision dry following showers. °Continue to use ice for pain and swelling after surgery. °Do not use any lotions or creams on the incision until instructed by your surgeon. ° °Take Xarelto for two and a half more weeks, then discontinue Xarelto. °Once the patient has completed the blood thinner regimen, then take a Baby 81 mg Aspirin daily for three more weeks. ° ° °Information on my medicine - XARELTO® (Rivaroxaban) ° °This medication education was reviewed with me or my healthcare representative as part of my discharge preparation.  The pharmacist that  spoke with me during my hospital stay was:  Runyon, Amanda, RPH ° °Why was Xarelto® prescribed for you? °Xarelto® was prescribed for you to reduce the risk of blood clots forming after orthopedic surgery. The medical term for these abnormal blood clots is venous thromboembolism (VTE). ° °What do you need to know about xarelto® ? °Take your Xarelto® ONCE   DAILY at the same time every day. °You may take it either with or without food. ° °If you have difficulty swallowing the tablet whole, you may crush it and mix in applesauce just prior to taking your dose. ° °Take Xarelto® exactly as prescribed by your doctor and DO NOT stop taking Xarelto® without talking to the doctor who prescribed the medication.  Stopping without other VTE prevention medication to take the place of Xarelto® may increase your risk of developing a clot. ° °After discharge, you should have regular check-up appointments with your healthcare provider that is prescribing your Xarelto®.   ° °What do you do if you miss a dose? °If you miss a dose, take it as soon as you remember on the same day then continue your regularly scheduled once daily regimen the next day. Do not take two doses of Xarelto® on the same day.  ° °Important Safety Information °A possible side effect of Xarelto® is bleeding. You should call your healthcare provider right away if you experience any of the following: °? Bleeding from an injury or your nose that does not stop. °? Unusual colored urine (red or dark brown) or unusual colored stools (red or black). °? Unusual bruising for unknown reasons. °? A serious fall or if you hit your head (even if there is no bleeding). ° °Some medicines may interact with Xarelto® and might increase your risk of bleeding while on Xarelto®. To help avoid this, consult your healthcare provider or pharmacist prior to using any new prescription or non-prescription medications, including herbals, vitamins, non-steroidal anti-inflammatory drugs (NSAIDs)  and supplements. ° °This website has more information on Xarelto®: www.xarelto.com. ° ° ° °

## 2015-08-05 NOTE — Care Management Note (Signed)
Case Management Note  Patient Details  Name: SKYLEI KENDZIORSKI MRN: [id number] Date of Birth: 1951-03-08  Subjective/Objective:     S/p Left total hip arthroplasty, anterior approach               Action/Plan: Discharge planning, spoke with patient and spouse at bedside. Have chosen [facility]. Contacted [facility] for referral. Has a RW at home, declines 3-n-1.   Expected Discharge Date:                  Expected Discharge Plan:  [place]  In-House Referral:  NA  Discharge planning Services  CM Consult  Post Acute Care Choice:  Home Health Choice offered to:  Patient  DME Arranged:  N/A DME Agency:  NA  HH Arranged:  PT HH Agency:  [facility]  Status of Service:  Completed, signed off  Medicare Important Message Given:    Date Medicare IM Given:    Medicare IM give by:    Date Additional Medicare IM Given:    Additional Medicare Important Message give by:     If discussed at [place] of Stay Meetings, dates discussed:    Additional Comments:  Guadalupe Maple, RN 08/05/2015, 11:23 AM

## 2015-08-05 NOTE — Progress Notes (Signed)
Physical Therapy Treatment Patient Details Name: AMRITHA SALZILLO MRN: [id number] DOB: 1951-04-08 Today's Date: 08/05/2015    History of Present Illness s/p L THR; s/p L TKR 11/15    PT Comments    Marked  Improvement from last session with improved pain control and decreased anxiety.  Follow Up Recommendations  Home health PT     Equipment Recommendations  None recommended by PT    Recommendations for Other Services OT consult     Precautions / Restrictions Precautions Precautions: Fall Restrictions Weight Bearing Restrictions: No Other Position/Activity Restrictions: WBAT    Mobility  Bed Mobility Overal bed mobility: Needs Assistance Bed Mobility: Supine to Sit     Supine to sit: Min assist     General bed mobility comments: cues for sequence and use of R LE to self assist  Transfers Overall transfer level: Needs assistance Equipment used: Rolling walker (2 wheeled) Transfers: Sit to/from Stand Sit to Stand: Min assist         General transfer comment: cues for UE/LE placement; steadying assistance  Ambulation/Gait Ambulation/Gait assistance: Min assist Ambulation Distance (Feet): 111 Feet Assistive device: Rolling walker (2 wheeled) Gait Pattern/deviations: Step-to pattern;Step-through pattern;Decreased step length - right;Decreased step length - left;Shuffle;Trunk flexed Gait velocity: decr   General Gait Details: cues for sequence, posture and position from RW.    Stairs            Wheelchair Mobility    Modified Rankin (Stroke Patients Only)       Balance                                    Cognition Arousal/Alertness: Awake/alert Behavior During Therapy: WFL for tasks assessed/performed Overall Cognitive Status: Within Functional Limits for tasks assessed                      Exercises Total Joint Exercises Ankle Circles/Pumps: AROM;Both;15 reps;Supine Quad Sets: AROM;Both;10 reps;Supine Heel Slides:  AAROM;20 reps;Supine;Left Hip ABduction/ADduction: AAROM;Left;15 reps;Supine    General Comments        Pertinent Vitals/Pain Pain Assessment: 0-10 Pain Score: 4  Pain Location: L hip Pain Descriptors / Indicators: Aching;Sore Pain Intervention(s): Limited activity within patient's tolerance;Monitored during session;Premedicated before session;Ice applied    Home Living Family/patient expects to be discharged to:: Private residence Living Arrangements: Spouse/significant other Available Help at Discharge: Family         Home Equipment: Bedside commode      Prior Function Level of Independence: Independent with assistive device(s)      Comments: using RW   PT Goals (current goals can now be found in the care plan section) Acute Rehab PT Goals Patient Stated Goal: Regain IND PT Goal Formulation: With patient Time For Goal Achievement: 08/09/15 Potential to Achieve Goals: Good Progress towards PT goals: Progressing toward goals    Frequency  7X/week    PT Plan Current plan remains appropriate    Co-evaluation PT/OT/SLP Co-Evaluation/Treatment: Yes Reason for Co-Treatment: For patient/therapist safety PT goals addressed during session: Mobility/safety with mobility OT goals addressed during session: ADL's and self-care     End of Session Equipment Utilized During Treatment: Gait belt Activity Tolerance: Patient tolerated treatment well Patient left: in chair;with call bell/phone within reach;with family/visitor present     Time: [id number] PT Time Calculation (min) (ACUTE ONLY): 38 min  Charges:  $Gait Training: 8-22 mins $Therapeutic Exercise: 8-22 mins  G Codes:      Cherity Blickenstaff 2015-08-27, 1:07 PM

## 2015-08-05 NOTE — Progress Notes (Signed)
   Subjective: 1 Day Post-Op Procedure(s) (LRB): TOTAL LEFT  HIP ARTHROPLASTY ANTERIOR APPROACH (Left) Patient reports pain as mild and moderate.   Patient seen in rounds for Dr. Wynelle Link.  Feeling a little better this morning.  Husband in room at bedside. Patient is well, but has had some minor complaints of pain in the hip, requiring pain medications We will resume therapy today.  Plan is to go Home after hospital stay.  Objective: Vital signs in last 24 hours: Temp:  [98 F (36.7 C)-99.8 F (37.7 C)] 99.1 F (37.3 C) (12/22 0542) Pulse Rate:  [61-100] 66 (12/22 0542) Resp:  [12-20] 16 (12/22 0542) BP: (114-183)/(58-125) 159/97 mmHg (12/22 0542) SpO2:  [96 %-100 %] 100 % (12/22 0542) Weight:  [81.903 kg (180 lb 9 oz)] 81.903 kg (180 lb 9 oz) (12/21 0946)  Intake/Output from previous day:  Intake/Output Summary (Last 24 hours) at 08/05/15 0849 Last data filed at 08/05/15 [id number]  Gross per 24 hour  Intake   3660 ml  Output   4175 ml  Net   -515 ml    Intake/Output this shift: Total I/O In: -  Out: 250 [Urine:250]  Labs:  Recent Labs  08/05/15 0458  HGB 12.2    Recent Labs  08/05/15 0458  WBC 18.2*  RBC 4.10  HCT 37.3  PLT 299    Recent Labs  08/05/15 0458  NA 140  K 4.5  CL 105  CO2 28  BUN 12  CREATININE 0.77  GLUCOSE 142*  CALCIUM 10.7*   No results for input(s): LABPT, INR in the last 72 hours.  EXAM General - Patient is Alert, Appropriate and Oriented Extremity - Neurovascular intact Sensation intact distally Dorsiflexion/Plantar flexion intact Dressing - dressing C/D/I Motor Function - intact, moving foot and toes well on exam.  Hemovac pulled without difficulty.  Past Medical History  Diagnosis Date  . Hypertension   . Hyperlipidemia   . Parathyroid tumor   . Osteoarthritis of knee     bil-gets injections  . GERD (gastroesophageal reflux disease)   . Fibromyalgia   . Numbness     right hand/comes and goes   . Wears glasses   .  History of urinary tract infection   . Urinary frequency     Assessment/Plan: 1 Day Post-Op Procedure(s) (LRB): TOTAL LEFT  HIP ARTHROPLASTY ANTERIOR APPROACH (Left) Principal Problem:   OA (osteoarthritis) of hip  Estimated body mass index is 27.05 kg/(m^2) as calculated from the following:   Height as of this encounter: 5' 8.5" (1.74 m).   Weight as of this encounter: 81.903 kg (180 lb 9 oz). Advance diet Up with therapy Plan for discharge tomorrow Discharge home with home health  DVT Prophylaxis - Xarelto Weight Bearing As Tolerated left Leg Hemovac Pulled Begin Therapy  Arlee Muslim, PA-C Orthopaedic Surgery 08/05/2015, 8:49 AM

## 2015-08-05 NOTE — Evaluation (Signed)
Occupational Therapy Evaluation Patient Details Name: Alexandra Gibson MRN: [id number] DOB: 08-14-1951 Today's Date: 08/05/2015    History of Present Illness s/p L THR; s/p L TKR 11/15   Clinical Impression   This 64 year old female was admitted for the above surgery.  Will follow in acute to review bathroom transfers.  Husband will assist her with adls as needed at home.  Goals in acute are for min guard.    Follow Up Recommendations  No OT follow up    Equipment Recommendations  None recommended by OT    Recommendations for Other Services       Precautions / Restrictions Precautions Precautions: Fall Restrictions Weight Bearing Restrictions: No Other Position/Activity Restrictions: WBAT      Mobility Bed Mobility         Supine to sit: Min assist     General bed mobility comments: cues for sequence  Transfers   Equipment used: Rolling walker (2 wheeled) Transfers: Sit to/from Stand Sit to Stand: Min assist         General transfer comment: cues for UE/LE placement; steadying assistance    Balance                                            ADL Overall ADL's : Needs assistance/impaired             Lower Body Bathing: Moderate assistance;Sit to/from stand       Lower Body Dressing: Maximal assistance;Sit to/from stand   Toilet Transfer: Minimal assistance;Stand-pivot;BSC   Toileting- Clothing Manipulation and Hygiene: Minimal assistance;Sit to/from stand         General ADL Comments: pt is able to perform UB adls with set up.  Seen with PT today as pt needed +2 assistance yesterday.  Husband will assist with adls as needed.  Pt had one period of unsteadiness when using gel in standing; steadying assistance given  Will plan to stop by tomorrow to see if pt needs to practice bathroom transfers.  She is hoping she can go upstairs where shower is located     Vision     Perception     Praxis      Pertinent Vitals/Pain  Pain Score: 4  Pain Location: L hip Pain Descriptors / Indicators: Aching Pain Intervention(s): Limited activity within patient's tolerance;Monitored during session;Premedicated before session;Repositioned;Ice applied     Hand Dominance     Extremity/Trunk Assessment Upper Extremity Assessment Upper Extremity Assessment: Overall WFL for tasks assessed           Communication Communication Communication: No difficulties   Cognition                           General Comments       Exercises       Shoulder Instructions      Home Living Family/patient expects to be discharged to:: Private residence Living Arrangements: Spouse/significant other Available Help at Discharge: Family               Bathroom Shower/Tub: Occupational psychologist: Standard     Home Equipment: Bedside commode          Prior Functioning/Environment Level of Independence: Independent with assistive device(s)        Comments: using RW    OT Diagnosis: Generalized weakness  OT Problem List: Decreased strength;Decreased activity tolerance;Impaired balance (sitting and/or standing);Decreased knowledge of use of DME or AE;Pain   OT Treatment/Interventions: Self-care/ADL training;DME and/or AE instruction;Patient/family education;Balance training    OT Goals(Current goals can be found in the care plan section) Acute Rehab OT Goals Patient Stated Goal: Regain IND OT Goal Formulation: With patient/family Time For Goal Achievement: 08/09/15 Potential to Achieve Goals: Good ADL Goals Pt Will Transfer to Toilet: with min guard assist;ambulating;bedside commode Pt Will Perform Tub/Shower Transfer: Shower transfer;with min guard assist;ambulating;3 in 1  OT Frequency: Min 2X/week   Barriers to D/C:            Co-evaluation PT/OT/SLP Co-Evaluation/Treatment: Yes Reason for Co-Treatment: For patient/therapist safety PT goals addressed during session:  Mobility/safety with mobility OT goals addressed during session: ADL's and self-care      End of Session    Activity Tolerance: Patient tolerated treatment well Patient left: in chair;with call bell/phone within reach;with family/visitor present   Time: [id number] OT Time Calculation (min): 31 min Charges:  OT General Charges $OT Visit: 1 Procedure OT Evaluation $Initial OT Evaluation Tier I: 1 Procedure G-Codes:    Bartow Zylstra 2015/08/22, 12:31 PM Lesle Chris, OTR/L [phone] 08/22/2015

## 2015-08-05 NOTE — Progress Notes (Signed)
Physical Therapy Treatment Patient Details Name: Alexandra Gibson MRN: [id number] DOB: 01-Nov-1950 Today's Date: 08/05/2015    History of Present Illness s/p L THR; s/p L TKR 11/15    PT Comments    Pt progressing well with mobility.  Reviewed car transfers and stairs.  Follow Up Recommendations  Home health PT     Equipment Recommendations  None recommended by PT    Recommendations for Other Services OT consult     Precautions / Restrictions Precautions Precautions: Fall Restrictions Weight Bearing Restrictions: No Other Position/Activity Restrictions: WBAT    Mobility  Bed Mobility Overal bed mobility: Needs Assistance Bed Mobility: Sit to Supine;Supine to Sit     Supine to sit: Min assist Sit to supine: Min assist   General bed mobility comments: cues for sequence and use of R LE to self assist - repeated x 2  Transfers Overall transfer level: Needs assistance Equipment used: Rolling walker (2 wheeled) Transfers: Sit to/from Stand Sit to Stand: Min assist         General transfer comment: cues for UE/LE placement; steadying assistance  Ambulation/Gait Ambulation/Gait assistance: Min assist;Min guard Ambulation Distance (Feet): 222 Feet Assistive device: Rolling walker (2 wheeled) Gait Pattern/deviations: Step-to pattern;Step-through pattern;Decreased step length - right;Decreased step length - left;Shuffle;Trunk flexed Gait velocity: decr   General Gait Details: cues for sequence, posture, ER on L and position from RW.    Stairs Stairs: Yes Stairs assistance: Min assist Stair Management: One rail Left;Step to pattern;Forwards;With crutches Number of Stairs: 5 General stair comments: cues for sequence and foot/crutch placement  Wheelchair Mobility    Modified Rankin (Stroke Patients Only)       Balance                                    Cognition Arousal/Alertness: Awake/alert Behavior During Therapy: WFL for tasks  assessed/performed Overall Cognitive Status: Within Functional Limits for tasks assessed                      Exercises      General Comments        Pertinent Vitals/Pain Pain Assessment: 0-10 Pain Score: 5  Pain Location: L hip Pain Descriptors / Indicators: Aching;Sore Pain Intervention(s): Limited activity within patient's tolerance;Monitored during session;Premedicated before session;Ice applied    Home Living                      Prior Function            PT Goals (current goals can now be found in the care plan section) Acute Rehab PT Goals Patient Stated Goal: Regain IND PT Goal Formulation: With patient Time For Goal Achievement: 08/09/15 Potential to Achieve Goals: Good Progress towards PT goals: Progressing toward goals    Frequency  7X/week    PT Plan Current plan remains appropriate    Co-evaluation             End of Session Equipment Utilized During Treatment: Gait belt Activity Tolerance: Patient tolerated treatment well Patient left: in bed;with call bell/phone within reach;with family/visitor present     Time: 1450-1520 PT Time Calculation (min) (ACUTE ONLY): 30 min  Charges:  $Gait Training: 8-22 mins $Therapeutic Activity: 8-22 mins                    G Codes:  Ralphine Hinks 08/05/2015, 5:00 PM

## 2015-08-06 LAB — CBC
HCT: 37.4 % (ref 36.0–46.0)
HEMOGLOBIN: 11.8 g/dL — AB (ref 12.0–15.0)
MCH: 29 pg (ref 26.0–34.0)
MCHC: 31.6 g/dL (ref 30.0–36.0)
MCV: 91.9 fL (ref 78.0–100.0)
Platelets: 298 10*3/uL (ref 150–400)
RBC: 4.07 MIL/uL (ref 3.87–5.11)
RDW: 13.3 % (ref 11.5–15.5)
WBC: 19.7 10*3/uL — AB (ref 4.0–10.5)

## 2015-08-06 LAB — BASIC METABOLIC PANEL
Anion gap: 7 (ref 5–15)
BUN: 15 mg/dL (ref 6–20)
CHLORIDE: 103 mmol/L (ref 101–111)
CO2: 32 mmol/L (ref 22–32)
Calcium: 11.1 mg/dL — ABNORMAL HIGH (ref 8.9–10.3)
Creatinine, Ser: 0.81 mg/dL (ref 0.44–1.00)
GFR calc non Af Amer: >60 mL/min (ref >60)
Glucose, Bld: 119 mg/dL — ABNORMAL HIGH (ref 65–99)
POTASSIUM: 4.6 mmol/L (ref 3.5–5.1)
SODIUM: 142 mmol/L (ref 135–145)

## 2015-08-06 MED ORDER — RIVAROXABAN 10 MG PO TABS: 10.0000 mg | ORAL_TABLET | Freq: Every day | ORAL | Status: DC

## 2015-08-06 MED ORDER — OXYCODONE HCL 5 MG PO TABS: 5.0000 mg | ORAL_TABLET | ORAL | Status: DC | PRN

## 2015-08-06 MED ORDER — METHOCARBAMOL 500 MG PO TABS: 500.0000 mg | ORAL_TABLET | Freq: Four times a day (QID) | ORAL | Status: DC | PRN

## 2015-08-06 MED ORDER — TRAMADOL HCL 50 MG PO TABS: 50.0000 mg | ORAL_TABLET | Freq: Four times a day (QID) | ORAL | Status: DC | PRN

## 2015-08-06 NOTE — Progress Notes (Signed)
Pt to d/c home with [facility] home health. No DME needs. AVS reviewed and "My Chart" discussed with pt. Pt capable of verbalizing medications, dressing changes, signs and symptoms of infection, and follow-up appointments. Remains hemodynamically stable. No signs and symptoms of distress. Educated pt to return to ER in the case of SOB, dizziness, or chest pain.  

## 2015-08-06 NOTE — Progress Notes (Signed)
   Subjective: 2 Days Post-Op Procedure(s) (LRB): TOTAL LEFT  HIP ARTHROPLASTY ANTERIOR APPROACH (Left) Patient reports pain as mild.   Patient seen in rounds with Dr. Wynelle Link. Patient is well, and has had no acute complaints or problems Patient is ready to go home   Objective: Vital signs in last 24 hours: Temp:  [98.5 F (36.9 C)-99.1 F (37.3 C)] 99.1 F (37.3 C) (12/23 0555) Pulse Rate:  [70-90] 90 (12/23 0555) Resp:  [16-18] 18 (12/23 0555) BP: (103-135)/(56-79) 135/79 mmHg (12/23 0555) SpO2:  [95 %-97 %] 96 % (12/23 0555)  Intake/Output from previous day:  Intake/Output Summary (Last 24 hours) at 08/06/15 0915 Last data filed at 08/06/15 0600  Gross per 24 hour  Intake   1560 ml  Output   1850 ml  Net   -290 ml    Labs:  Recent Labs  08/05/15 0458 08/06/15 0415  HGB 12.2 11.8*    Recent Labs  08/05/15 0458 08/06/15 0415  WBC 18.2* 19.7*  RBC 4.10 4.07  HCT 37.3 37.4  PLT 299 298    Recent Labs  08/05/15 0458 08/06/15 0415  NA 140 142  K 4.5 4.6  CL 105 103  CO2 28 32  BUN 12 15  CREATININE 0.77 0.81  GLUCOSE 142* 119*  CALCIUM 10.7* 11.1*   No results for input(s): LABPT, INR in the last 72 hours.  EXAM: General - Patient is Alert, Appropriate and Oriented Extremity - Neurovascular intact Sensation intact distally Dorsiflexion/Plantar flexion intact Incision - clean, dry, no drainage Motor Function - intact, moving foot and toes well on exam.   Assessment/Plan: 2 Days Post-Op Procedure(s) (LRB): TOTAL LEFT  HIP ARTHROPLASTY ANTERIOR APPROACH (Left) Procedure(s) (LRB): TOTAL LEFT  HIP ARTHROPLASTY ANTERIOR APPROACH (Left) Past Medical History  Diagnosis Date  . Hypertension   . Hyperlipidemia   . Parathyroid tumor   . Osteoarthritis of knee     bil-gets injections  . GERD (gastroesophageal reflux disease)   . Fibromyalgia   . Numbness     right hand/comes and goes   . Wears glasses   . History of urinary tract infection   .  Urinary frequency    Principal Problem:   OA (osteoarthritis) of hip  Estimated body mass index is 27.05 kg/(m^2) as calculated from the following:   Height as of this encounter: 5' 8.5" (1.74 m).   Weight as of this encounter: 81.903 kg (180 lb 9 oz). Up with therapy Discharge home with home health Diet - Cardiac diet Follow up - in 2 weeks Activity - WBAT Disposition - Home Condition Upon Discharge - Good D/C Meds - See DC Summary DVT Prophylaxis - Xarelto  Arlee Muslim, PA-C Orthopaedic Surgery 08/06/2015, 9:15 AM

## 2015-08-06 NOTE — Discharge Summary (Signed)
Physician Discharge Summary   Patient ID: Alexandra Gibson MRN: [id number] DOB/AGE: 17-Sep-1950 37 y.o.  Admit date: 08/04/2015 Discharge date: 08-06-2015  Primary Diagnosis:  Osteoarthritis of the Left hip.   Admission Diagnoses:  Past Medical History  Diagnosis Date  . Hypertension   . Hyperlipidemia   . Parathyroid tumor   . Osteoarthritis of knee     bil-gets injections  . GERD (gastroesophageal reflux disease)   . Fibromyalgia   . Numbness     right hand/comes and goes   . Wears glasses   . History of urinary tract infection   . Urinary frequency    Discharge Diagnoses:   Principal Problem:   OA (osteoarthritis) of hip  Estimated body mass index is 27.05 kg/(m^2) as calculated from the following:   Height as of this encounter: 5' 8.5" (1.74 m).   Weight as of this encounter: 81.903 kg (180 lb 9 oz).  Procedure(s) (LRB): TOTAL LEFT  HIP ARTHROPLASTY ANTERIOR APPROACH (Left)   Consults: None  HPI: Alexandra Gibson is a 64 y.o. female who has advanced end-  stage arthritis of her Left hip with progressively worsening pain and  dysfunction.The patient has failed nonoperative management and presents for  total hip arthroplasty.   Laboratory Data: Admission on 08/04/2015  Component Date Value Ref Range Status  . WBC 08/05/2015 18.2* 4.0 - 10.5 K/uL Final  . RBC 08/05/2015 4.10  3.87 - 5.11 MIL/uL Final  . Hemoglobin 08/05/2015 12.2  12.0 - 15.0 g/dL Final  . HCT 08/05/2015 37.3  36.0 - 46.0 % Final  . MCV 08/05/2015 91.0  78.0 - 100.0 fL Final  . MCH 08/05/2015 29.8  26.0 - 34.0 pg Final  . MCHC 08/05/2015 32.7  30.0 - 36.0 g/dL Final  . RDW 08/05/2015 13.3  11.5 - 15.5 % Final  . Platelets 08/05/2015 299  150 - 400 K/uL Final  . Sodium 08/05/2015 140  135 - 145 mmol/L Final  . Potassium 08/05/2015 4.5  3.5 - 5.1 mmol/L Final  . Chloride 08/05/2015 105  101 - 111 mmol/L Final  . CO2 08/05/2015 28  22 - 32 mmol/L Final  . Glucose, Bld 08/05/2015 142* 65 - 99  mg/dL Final  . BUN 08/05/2015 12  6 - 20 mg/dL Final  . Creatinine, Ser 08/05/2015 0.77  0.44 - 1.00 mg/dL Final  . Calcium 08/05/2015 10.7* 8.9 - 10.3 mg/dL Final  . GFR calc non Af Amer 08/05/2015 >60  >60 mL/min Final  . GFR calc Af Amer 08/05/2015 >60  >60 mL/min Final   Comment: (NOTE) The eGFR has been calculated using the CKD EPI equation. This calculation has not been validated in all clinical situations. eGFR's persistently <60 mL/min signify possible Chronic Kidney Disease.   . Anion gap 08/05/2015 7  5 - 15 Final  . WBC 08/06/2015 19.7* 4.0 - 10.5 K/uL Final  . RBC 08/06/2015 4.07  3.87 - 5.11 MIL/uL Final  . Hemoglobin 08/06/2015 11.8* 12.0 - 15.0 g/dL Final  . HCT 08/06/2015 37.4  36.0 - 46.0 % Final  . MCV 08/06/2015 91.9  78.0 - 100.0 fL Final  . MCH 08/06/2015 29.0  26.0 - 34.0 pg Final  . MCHC 08/06/2015 31.6  30.0 - 36.0 g/dL Final  . RDW 08/06/2015 13.3  11.5 - 15.5 % Final  . Platelets 08/06/2015 298  150 - 400 K/uL Final  . Sodium 08/06/2015 142  135 - 145 mmol/L Final  . Potassium 08/06/2015 4.6  3.5 -  5.1 mmol/L Final  . Chloride 08/06/2015 103  101 - 111 mmol/L Final  . CO2 08/06/2015 32  22 - 32 mmol/L Final  . Glucose, Bld 08/06/2015 119* 65 - 99 mg/dL Final  . BUN 08/06/2015 15  6 - 20 mg/dL Final  . Creatinine, Ser 08/06/2015 0.81  0.44 - 1.00 mg/dL Final  . Calcium 08/06/2015 11.1* 8.9 - 10.3 mg/dL Final  . GFR calc non Af Amer 08/06/2015 >60  >60 mL/min Final  . GFR calc Af Amer 08/06/2015 >60  >60 mL/min Final   Comment: (NOTE) The eGFR has been calculated using the CKD EPI equation. This calculation has not been validated in all clinical situations. eGFR's persistently <60 mL/min signify possible Chronic Kidney Disease.   Alexandra Gibson gap 08/06/2015 7  5 - 15 Final  Hospital Outpatient Visit on 07/30/2015  Component Date Value Ref Range Status  . MRSA, PCR 07/30/2015 NEGATIVE  NEGATIVE Final  . Staphylococcus aureus 07/30/2015 NEGATIVE  NEGATIVE  Final   Comment:        The Xpert SA Assay (FDA approved for NASAL specimens in patients over 90 years of age), is one component of a comprehensive surveillance program.  Test performance has been validated by [facility] for patients greater than or equal to 76 year old. It is not intended to diagnose infection nor to guide or monitor treatment.   Marland Kitchen aPTT 07/30/2015 31  24 - 37 seconds Final  . WBC 07/30/2015 9.8  4.0 - 10.5 K/uL Final  . RBC 07/30/2015 4.57  3.87 - 5.11 MIL/uL Final  . Hemoglobin 07/30/2015 13.4  12.0 - 15.0 g/dL Final  . HCT 07/30/2015 41.5  36.0 - 46.0 % Final  . MCV 07/30/2015 90.8  78.0 - 100.0 fL Final  . MCH 07/30/2015 29.3  26.0 - 34.0 pg Final  . MCHC 07/30/2015 32.3  30.0 - 36.0 g/dL Final  . RDW 07/30/2015 13.5  11.5 - 15.5 % Final  . Platelets 07/30/2015 305  150 - 400 K/uL Final  . Sodium 07/30/2015 142  135 - 145 mmol/L Final  . Potassium 07/30/2015 4.2  3.5 - 5.1 mmol/L Final  . Chloride 07/30/2015 105  101 - 111 mmol/L Final  . CO2 07/30/2015 28  22 - 32 mmol/L Final  . Glucose, Bld 07/30/2015 93  65 - 99 mg/dL Final  . BUN 07/30/2015 20  6 - 20 mg/dL Final  . Creatinine, Ser 07/30/2015 0.78  0.44 - 1.00 mg/dL Final  . Calcium 07/30/2015 10.9* 8.9 - 10.3 mg/dL Final  . Total Protein 07/30/2015 7.9  6.5 - 8.1 g/dL Final  . Albumin 07/30/2015 4.8  3.5 - 5.0 g/dL Final  . AST 07/30/2015 16  15 - 41 U/L Final  . ALT 07/30/2015 14  14 - 54 U/L Final  . Alkaline Phosphatase 07/30/2015 91  38 - 126 U/L Final  . Total Bilirubin 07/30/2015 1.2  0.3 - 1.2 mg/dL Final  . GFR calc non Af Amer 07/30/2015 >60  >60 mL/min Final  . GFR calc Af Amer 07/30/2015 >60  >60 mL/min Final   Comment: (NOTE) The eGFR has been calculated using the CKD EPI equation. This calculation has not been validated in all clinical situations. eGFR's persistently <60 mL/min signify possible Chronic Kidney Disease.   . Anion gap 07/30/2015 9  5 - 15 Final  . Prothrombin Time  07/30/2015 14.3  11.6 - 15.2 seconds Final  . INR 07/30/2015 1.09  0.00 - 1.49 Final  .  ABO/RH(D) 07/30/2015 O POS   Final  . Antibody Screen 07/30/2015 NEG   Final  . Sample Expiration 07/30/2015 08/13/2015   Final  . Extend sample reason 07/30/2015 NO TRANSFUSIONS OR PREGNANCY IN THE PAST 3 MONTHS   Final  . Color, Urine 07/30/2015 YELLOW  YELLOW Final  . APPearance 07/30/2015 CLEAR  CLEAR Final  . Specific Gravity, Urine 07/30/2015 1.027  1.005 - 1.030 Final  . pH 07/30/2015 5.5  5.0 - 8.0 Final  . Glucose, UA 07/30/2015 NEGATIVE  NEGATIVE mg/dL Final  . Hgb urine dipstick 07/30/2015 NEGATIVE  NEGATIVE Final  . Bilirubin Urine 07/30/2015 NEGATIVE  NEGATIVE Final  . Ketones, ur 07/30/2015 NEGATIVE  NEGATIVE mg/dL Final  . Protein, ur 07/30/2015 NEGATIVE  NEGATIVE mg/dL Final  . Nitrite 07/30/2015 NEGATIVE  NEGATIVE Final  . Leukocytes, UA 07/30/2015 SMALL* NEGATIVE Final  . Squamous Epithelial / LPF 07/30/2015 0-5* NONE SEEN Final  . WBC, UA 07/30/2015 6-30  0 - 5 WBC/hpf Final  . RBC / HPF 07/30/2015 0-5  0 - 5 RBC/hpf Final  . Bacteria, UA 07/30/2015 RARE* NONE SEEN Final  . Urine-Other 07/30/2015 MUCOUS PRESENT   Final     X-Rays:Dg Pelvis Portable  08/04/2015  CLINICAL DATA:  Status post left hip replacement EXAM: PORTABLE PELVIS 1-2 VIEWS COMPARISON:  None. FINDINGS: Left hip replacement is noted. No acute fracture or dislocation is seen. No acute bony abnormality is noted. A surgical drain is seen in place. IMPRESSION: Status post left hip replacement Electronically Signed   By: Inez Catalina M.D.   On: 08/04/2015 13:45   Dg C-arm 1-60 Min-no Report  08/04/2015  CLINICAL DATA: hip C-ARM 1-60 MINUTES Fluoroscopy was utilized by the requesting physician.  No radiographic interpretation.    EKG:No orders found for this or any previous visit.   Hospital Course: Patient was admitted to [facility] and taken to the OR and underwent the above state procedure without  complications.  Patient tolerated the procedure well and was later transferred to the recovery room and then to the orthopaedic floor for postoperative care.  They were given PO and IV analgesics for pain control following their surgery.  They were given 24 hours of postoperative antibiotics of  Anti-infectives    Start     Dose/Rate Route Frequency Ordered Stop   08/04/15 1700  ceFAZolin (ANCEF) IVPB 2 g/50 mL premix     2 g 100 mL/hr over 30 Minutes Intravenous Every 6 hours 08/04/15 1416 08/04/15 2328   08/04/15 0923  ceFAZolin (ANCEF) IVPB 2 g/50 mL premix     2 g 100 mL/hr over 30 Minutes Intravenous On call to O.R. 08/04/15 7616 08/04/15 1116     and started on DVT prophylaxis in the form of Xarelto.   PT and OT were ordered for total hip protocol.  The patient was allowed to be WBAT with therapy. Discharge planning was consulted to help with postop disposition and equipment needs.  Patient had a decent night on the evening of surgery.  They started to get up OOB with therapy on day one.  Hemovac drain was pulled without difficulty.  Continued to work with therapy into day two.  Dressing was changed on day two and the incision was healing well. Patient was seen in rounds and was ready to go home.  Discharge home with home health Diet - Cardiac diet Follow up - in 2 weeks Activity - WBAT Disposition - Home Condition Upon Discharge - Good D/C  Meds - See DC Summary DVT Prophylaxis - Xarelto  Discharge Instructions    Call MD / Call 911    Complete by:  As directed   If you experience chest pain or shortness of breath, CALL 911 and be transported to the hospital emergency room.  If you develope a fever above 101 F, pus (white drainage) or increased drainage or redness at the wound, or calf pain, call your surgeon's office.     Change dressing    Complete by:  As directed   You may change your dressing dressing daily with sterile 4 x 4 inch gauze dressing and paper tape.  Do not submerge  the incision under water.     Constipation Prevention    Complete by:  As directed   Drink plenty of fluids.  Prune juice may be helpful.  You may use a stool softener, such as Colace (over the counter) 100 mg twice a day.  Use MiraLax (over the counter) for constipation as needed.     Diet - low sodium heart healthy    Complete by:  As directed      Discharge instructions    Complete by:  As directed   Pick up stool softner and laxative for home use following surgery while on pain medications. Do not submerge incision under water. Please use good hand washing techniques while changing dressing each day. May shower starting three days after surgery. Please use a clean towel to pat the incision dry following showers. Continue to use ice for pain and swelling after surgery. Do not use any lotions or creams on the incision until instructed by your surgeon.  Total Hip Protocol.  Take Xarelto for two and a half more weeks, then discontinue Xarelto. Once the patient has completed the blood thinner regimen, then take a Baby 81 mg Aspirin daily for three more weeks.  Postoperative Constipation Protocol  Constipation - defined medically as fewer than three stools per week and severe constipation as less than one stool per week.  One of the most common issues patients have following surgery is constipation.  Even if you have a regular bowel pattern at home, your normal regimen is likely to be disrupted due to multiple reasons following surgery.  Combination of anesthesia, postoperative narcotics, change in appetite and fluid intake all can affect your bowels.  In order to avoid complications following surgery, here are some recommendations in order to help you during your recovery period.  Colace (docusate) - Pick up an over-the-counter form of Colace or another stool softener and take twice a day as long as you are requiring postoperative pain medications.  Take with a full glass of water daily.   If you experience loose stools or diarrhea, hold the colace until you stool forms back up.  If your symptoms do not get better within 1 week or if they get worse, check with your doctor.  Dulcolax (bisacodyl) - Pick up over-the-counter and take as directed by the product packaging as needed to assist with the movement of your bowels.  Take with a full glass of water.  Use this product as needed if not relieved by Colace only.   MiraLax (polyethylene glycol) - Pick up over-the-counter to have on hand.  MiraLax is a solution that will increase the amount of water in your bowels to assist with bowel movements.  Take as directed and can mix with a glass of water, juice, soda, coffee, or tea.  Take if you go  more than two days without a movement. Do not use MiraLax more than once per day. Call your doctor if you are still constipated or irregular after using this medication for 7 days in a row.  If you continue to have problems with postoperative constipation, please contact the office for further assistance and recommendations.  If you experience "the worst abdominal pain ever" or develop nausea or vomiting, please contact the office immediatly for further recommendations for treatment.     Do not sit on low chairs, stoools or toilet seats, as it may be difficult to get up from low surfaces    Complete by:  As directed      Driving restrictions    Complete by:  As directed   No driving until released by the physician.     Increase activity slowly as tolerated    Complete by:  As directed      Lifting restrictions    Complete by:  As directed   No lifting until released by the physician.     Patient may shower    Complete by:  As directed   You may shower without a dressing once there is no drainage.  Do not wash over the wound.  If drainage remains, do not shower until drainage stops.     TED hose    Complete by:  As directed   Use stockings (TED hose) for 3 weeks on both leg(s).  You may remove  them at night for sleeping.     Weight bearing as tolerated    Complete by:  As directed   Laterality:  left  Extremity:  Lower            Medication List    STOP taking these medications        celecoxib 200 MG capsule  Commonly known as:  CELEBREX     meloxicam 15 MG tablet  Commonly known as:  MOBIC     multivitamin with minerals Tabs tablet     vitamin B-12 1000 MCG tablet  Commonly known as:  CYANOCOBALAMIN     VITAMIN D PO      TAKE these medications        amLODipine 5 MG tablet  Commonly known as:  NORVASC  Take 5 mg by mouth daily.     cyclobenzaprine 10 MG tablet  Commonly known as:  FLEXERIL  Take 10 mg by mouth 3 (three) times daily as needed for muscle spasms.     fexofenadine 180 MG tablet  Commonly known as:  ALLEGRA  Take 180 mg by mouth daily as needed for allergies.     irbesartan 300 MG tablet  Commonly known as:  AVAPRO  Take 300 mg by mouth every morning.     methocarbamol 500 MG tablet  Commonly known as:  ROBAXIN  Take 1 tablet (500 mg total) by mouth every 6 (six) hours as needed for muscle spasms.     oxyCODONE 5 MG immediate release tablet  Commonly known as:  Oxy IR/ROXICODONE  Take 1-2 tablets (5-10 mg total) by mouth every 3 (three) hours as needed for moderate pain or severe pain.     pantoprazole 40 MG tablet  Commonly known as:  PROTONIX  Take 40 mg by mouth daily.     pravastatin 20 MG tablet  Commonly known as:  PRAVACHOL  Take 20 mg by mouth daily.     rivaroxaban 10 MG Tabs tablet  Commonly known as:  Alveda Reasons  Take 1 tablet (10 mg total) by mouth daily with breakfast. Take Xarelto for two and a half more weeks, then discontinue Xarelto. Once the patient has completed the blood thinner regimen, then take a Baby 81 mg Aspirin daily for three more weeks.     traMADol 50 MG tablet  Commonly known as:  ULTRAM  Take 1-2 tablets (50-100 mg total) by mouth every 6 (six) hours as needed (mild pain).             Follow-up Information    Follow up with [facility].   Why:  physical therapy   Contact information:   [place] [address] [place] [address] [phone]       Follow up with [facility].   Why:  physical therapy   Contact information:   [place] [address] [phone]       Follow up with Gearlean Alf, MD. Schedule an appointment as soon as possible for a visit on 08/19/2015.   Specialty:  Orthopedic Surgery   Why:  Call office at [phone] to setup appointment on Thursday 08/19/2015 with Dr. Wynelle Link.   Contact information:   [address] [place] [address] [phone]       Signed: Arlee Muslim, PA-C Orthopaedic Surgery 08/06/2015, 9:21 AM

## 2015-08-06 NOTE — Progress Notes (Signed)
OT Cancellation Note  Patient Details Name: Alexandra Gibson MRN: [id number] DOB: 05-01-1951   Cancelled Treatment:    Reason Eval/Treat Not Completed: Other (comment).  Pt just practiced backing up stair with PT.  Verbalizes sequence and will do the same thing for shower transfer.  She has tub bench and 3:1.  Husband has been assisting pt to bathroom.  No further OT needs  Jaryn Hocutt 08/06/2015, 12:30 PM  Lesle Chris, OTR/L [id number] 08/06/2015

## 2015-08-06 NOTE — Progress Notes (Signed)
Physical Therapy Treatment Patient Details Name: Alexandra Gibson MRN: [id number] DOB: [birth year] Today's Date: 08/06/2015    History of Present Illness s/p L THR; s/p L TKR 11/15    PT Comments    Pt progressing well with mobility.  Reviewed therex, stairs and car transfers with pt and spouse.  Follow Up Recommendations  Home health PT     Equipment Recommendations  None recommended by PT    Recommendations for Other Services OT consult     Precautions / Restrictions Precautions Precautions: Fall Restrictions Weight Bearing Restrictions: No Other Position/Activity Restrictions: WBAT    Mobility  Bed Mobility Overal bed mobility: Needs Assistance Bed Mobility: Supine to Sit     Supine to sit: Min assist     General bed mobility comments: cues for sequence and use of R LE to self assist - repeated x 2  Transfers Overall transfer level: Needs assistance Equipment used: Rolling walker (2 wheeled) Transfers: Sit to/from Stand Sit to Stand: Supervision         General transfer comment: cues for UE/LE placement  Ambulation/Gait Ambulation/Gait assistance: Min guard;Supervision Ambulation Distance (Feet): 150 Feet Assistive device: Rolling walker (2 wheeled) Gait Pattern/deviations: Step-to pattern;Step-through pattern;Decreased step length - right;Decreased step length - left;Shuffle;Trunk flexed Gait velocity: decr   General Gait Details: cues for sequence, posture, ER on L and position from RW.    Stairs Stairs: Yes Stairs assistance: Min assist Stair Management: No rails;One rail Right;Step to pattern;Backwards;Forwards;With walker;With crutches Number of Stairs: 12 General stair comments: cues for sequence and foot/crutch/RW placement; 10 steps wih crutch and rail and single step twice with RW bkwd  Wheelchair Mobility    Modified Rankin (Stroke Patients Only)       Balance                                    Cognition  Arousal/Alertness: Awake/alert Behavior During Therapy: WFL for tasks assessed/performed Overall Cognitive Status: Within Functional Limits for tasks assessed                      Exercises Total Joint Exercises Ankle Circles/Pumps: AROM;Both;15 reps;Supine Quad Sets: AROM;Both;10 reps;Supine Gluteal Sets: AROM;Both;10 reps;Supine Heel Slides: AAROM;20 reps;Supine;Left Hip ABduction/ADduction: AAROM;Left;15 reps;Supine Long Arc Quad: AAROM;AROM;Left;15 reps;Supine    General Comments        Pertinent Vitals/Pain Pain Assessment: 0-10 Pain Score: 4  Pain Location: L hip/thigh Pain Descriptors / Indicators: Aching;Burning Pain Intervention(s): Limited activity within patient's tolerance;Monitored during session;Premedicated before session    Home Living                      Prior Function            PT Goals (current goals can now be found in the care plan section) Acute Rehab PT Goals Patient Stated Goal: Regain IND PT Goal Formulation: With patient Time For Goal Achievement: 08/09/15 Potential to Achieve Goals: Good Progress towards PT goals: Progressing toward goals    Frequency  7X/week    PT Plan Current plan remains appropriate    Co-evaluation             End of Session Equipment Utilized During Treatment: Gait belt Activity Tolerance: Patient tolerated treatment well Patient left: in chair;with call bell/phone within reach;with family/visitor present     Time: [id number] PT Time Calculation (min) (ACUTE ONLY): 45 min  Charges:  $Gait Training: 8-22 mins $Therapeutic Exercise: 8-22 mins $Therapeutic Activity: 8-22 mins                    G Codes:      Vicki Chaffin 08-24-15, 12:58 PM

## 2015-08-07 DIAGNOSIS — Z96642 Presence of left artificial hip joint: Secondary | ICD-10-CM | POA: Diagnosis not present

## 2015-08-07 DIAGNOSIS — I10 Essential (primary) hypertension: Secondary | ICD-10-CM | POA: Diagnosis not present

## 2015-08-07 DIAGNOSIS — M797 Fibromyalgia: Secondary | ICD-10-CM | POA: Diagnosis not present

## 2015-08-07 DIAGNOSIS — M810 Age-related osteoporosis without current pathological fracture: Secondary | ICD-10-CM | POA: Diagnosis not present

## 2015-08-07 DIAGNOSIS — Z471 Aftercare following joint replacement surgery: Secondary | ICD-10-CM | POA: Diagnosis not present

## 2015-08-07 DIAGNOSIS — M1711 Unilateral primary osteoarthritis, right knee: Secondary | ICD-10-CM | POA: Diagnosis not present

## 2015-08-11 DIAGNOSIS — Z96642 Presence of left artificial hip joint: Secondary | ICD-10-CM | POA: Diagnosis not present

## 2015-08-11 DIAGNOSIS — I10 Essential (primary) hypertension: Secondary | ICD-10-CM | POA: Diagnosis not present

## 2015-08-11 DIAGNOSIS — Z471 Aftercare following joint replacement surgery: Secondary | ICD-10-CM | POA: Diagnosis not present

## 2015-08-11 DIAGNOSIS — M810 Age-related osteoporosis without current pathological fracture: Secondary | ICD-10-CM | POA: Diagnosis not present

## 2015-08-11 DIAGNOSIS — M1711 Unilateral primary osteoarthritis, right knee: Secondary | ICD-10-CM | POA: Diagnosis not present

## 2015-08-11 DIAGNOSIS — M797 Fibromyalgia: Secondary | ICD-10-CM | POA: Diagnosis not present

## 2015-08-13 DIAGNOSIS — Z471 Aftercare following joint replacement surgery: Secondary | ICD-10-CM | POA: Diagnosis not present

## 2015-08-13 DIAGNOSIS — M1711 Unilateral primary osteoarthritis, right knee: Secondary | ICD-10-CM | POA: Diagnosis not present

## 2015-08-13 DIAGNOSIS — Z96642 Presence of left artificial hip joint: Secondary | ICD-10-CM | POA: Diagnosis not present

## 2015-08-13 DIAGNOSIS — M797 Fibromyalgia: Secondary | ICD-10-CM | POA: Diagnosis not present

## 2015-08-13 DIAGNOSIS — I10 Essential (primary) hypertension: Secondary | ICD-10-CM | POA: Diagnosis not present

## 2015-08-13 DIAGNOSIS — M810 Age-related osteoporosis without current pathological fracture: Secondary | ICD-10-CM | POA: Diagnosis not present

## 2015-08-18 DIAGNOSIS — Z96642 Presence of left artificial hip joint: Secondary | ICD-10-CM | POA: Diagnosis not present

## 2015-08-18 DIAGNOSIS — M810 Age-related osteoporosis without current pathological fracture: Secondary | ICD-10-CM | POA: Diagnosis not present

## 2015-08-18 DIAGNOSIS — M797 Fibromyalgia: Secondary | ICD-10-CM | POA: Diagnosis not present

## 2015-08-18 DIAGNOSIS — I10 Essential (primary) hypertension: Secondary | ICD-10-CM | POA: Diagnosis not present

## 2015-08-18 DIAGNOSIS — M1711 Unilateral primary osteoarthritis, right knee: Secondary | ICD-10-CM | POA: Diagnosis not present

## 2015-08-18 DIAGNOSIS — Z471 Aftercare following joint replacement surgery: Secondary | ICD-10-CM | POA: Diagnosis not present

## 2015-08-20 DIAGNOSIS — M1711 Unilateral primary osteoarthritis, right knee: Secondary | ICD-10-CM | POA: Diagnosis not present

## 2015-08-20 DIAGNOSIS — Z471 Aftercare following joint replacement surgery: Secondary | ICD-10-CM | POA: Diagnosis not present

## 2015-08-20 DIAGNOSIS — M797 Fibromyalgia: Secondary | ICD-10-CM | POA: Diagnosis not present

## 2015-08-20 DIAGNOSIS — Z96642 Presence of left artificial hip joint: Secondary | ICD-10-CM | POA: Diagnosis not present

## 2015-08-20 DIAGNOSIS — M810 Age-related osteoporosis without current pathological fracture: Secondary | ICD-10-CM | POA: Diagnosis not present

## 2015-08-20 DIAGNOSIS — I10 Essential (primary) hypertension: Secondary | ICD-10-CM | POA: Diagnosis not present

## 2015-09-10 DIAGNOSIS — Z471 Aftercare following joint replacement surgery: Secondary | ICD-10-CM | POA: Diagnosis not present

## 2015-09-10 DIAGNOSIS — Z96642 Presence of left artificial hip joint: Secondary | ICD-10-CM | POA: Diagnosis not present

## 2015-09-10 DIAGNOSIS — M1611 Unilateral primary osteoarthritis, right hip: Secondary | ICD-10-CM | POA: Diagnosis not present

## 2015-09-10 DIAGNOSIS — M1711 Unilateral primary osteoarthritis, right knee: Secondary | ICD-10-CM | POA: Diagnosis not present

## 2015-09-29 DIAGNOSIS — E210 Primary hyperparathyroidism: Secondary | ICD-10-CM | POA: Diagnosis not present

## 2015-09-29 DIAGNOSIS — I129 Hypertensive chronic kidney disease with stage 1 through stage 4 chronic kidney disease, or unspecified chronic kidney disease: Secondary | ICD-10-CM | POA: Diagnosis not present

## 2015-09-29 DIAGNOSIS — E785 Hyperlipidemia, unspecified: Secondary | ICD-10-CM | POA: Diagnosis not present

## 2015-09-29 DIAGNOSIS — F3340 Major depressive disorder, recurrent, in remission, unspecified: Secondary | ICD-10-CM | POA: Diagnosis not present

## 2015-10-07 ENCOUNTER — Encounter: Payer: Self-pay | Admitting: Emergency Medicine

## 2015-10-07 ENCOUNTER — Emergency Department: Payer: Federal, State, Local not specified - PPO

## 2015-10-07 ENCOUNTER — Emergency Department
Admission: EM | Admit: 2015-10-07 | Discharge: 2015-10-07 | Disposition: A | Discharge status: Home / Self Care | Payer: Federal, State, Local not specified - PPO | Attending: Emergency Medicine | Admitting: Emergency Medicine

## 2015-10-07 ENCOUNTER — Ambulatory Visit
Admission: EM | Admit: 2015-10-07 | Discharge: 2015-10-07 | Disposition: A | Discharge status: Other Acute Inpatient Hospital | Payer: Federal, State, Local not specified - PPO | Attending: Emergency Medicine | Admitting: Emergency Medicine

## 2015-10-07 DIAGNOSIS — R109 Unspecified abdominal pain: Secondary | ICD-10-CM | POA: Diagnosis present

## 2015-10-07 DIAGNOSIS — N200 Calculus of kidney: Secondary | ICD-10-CM | POA: Insufficient documentation

## 2015-10-07 DIAGNOSIS — Z79899 Other long term (current) drug therapy: Secondary | ICD-10-CM | POA: Insufficient documentation

## 2015-10-07 DIAGNOSIS — I10 Essential (primary) hypertension: Secondary | ICD-10-CM | POA: Insufficient documentation

## 2015-10-07 DIAGNOSIS — R1032 Left lower quadrant pain: Secondary | ICD-10-CM | POA: Diagnosis not present

## 2015-10-07 DIAGNOSIS — R319 Hematuria, unspecified: Secondary | ICD-10-CM

## 2015-10-07 LAB — COMPREHENSIVE METABOLIC PANEL
ALBUMIN: 5 g/dL (ref 3.5–5.0)
ALT: 12 U/L — ABNORMAL LOW (ref 14–54)
AST: 22 U/L (ref 15–41)
Alkaline Phosphatase: 93 U/L (ref 38–126)
Anion gap: 9 (ref 5–15)
BUN: 21 mg/dL — AB (ref 6–20)
CHLORIDE: 101 mmol/L (ref 101–111)
CO2: 30 mmol/L (ref 22–32)
Calcium: 11.4 mg/dL — ABNORMAL HIGH (ref 8.9–10.3)
Creatinine, Ser: 1.36 mg/dL — ABNORMAL HIGH (ref 0.44–1.00)
GFR calc Af Amer: 46 mL/min — ABNORMAL LOW (ref >60)
GFR calc non Af Amer: 40 mL/min — ABNORMAL LOW (ref >60)
GLUCOSE: 117 mg/dL — AB (ref 65–99)
POTASSIUM: 4.3 mmol/L (ref 3.5–5.1)
Sodium: 140 mmol/L (ref 135–145)
Total Bilirubin: 0.9 mg/dL (ref 0.3–1.2)
Total Protein: 8.4 g/dL — ABNORMAL HIGH (ref 6.5–8.1)

## 2015-10-07 LAB — CBC WITH DIFFERENTIAL/PLATELET
BASOS ABS: 0.1 10*3/uL (ref 0–0.1)
BASOS PCT: 1 %
EOS PCT: 0 %
Eosinophils Absolute: 0 10*3/uL (ref 0–0.7)
HEMATOCRIT: 42.9 % (ref 35.0–47.0)
Hemoglobin: 13.9 g/dL (ref 12.0–16.0)
Lymphocytes Relative: 11 %
Lymphs Abs: 1.7 10*3/uL (ref 1.0–3.6)
MCH: 28.6 pg (ref 26.0–34.0)
MCHC: 32.4 g/dL (ref 32.0–36.0)
MCV: 88.2 fL (ref 80.0–100.0)
MONO ABS: 1 10*3/uL — AB (ref 0.2–0.9)
Monocytes Relative: 7 %
NEUTROS ABS: 12.2 10*3/uL — AB (ref 1.4–6.5)
Neutrophils Relative %: 81 %
PLATELETS: 290 10*3/uL (ref 150–440)
RBC: 4.86 MIL/uL (ref 3.80–5.20)
RDW: 14.4 % (ref 11.5–14.5)
WBC: 15 10*3/uL — AB (ref 3.6–11.0)

## 2015-10-07 LAB — OCCULT BLOOD X 1 CARD TO LAB, STOOL: FECAL OCCULT BLD: NEGATIVE

## 2015-10-07 LAB — URINALYSIS COMPLETE WITH MICROSCOPIC (ARMC ONLY)
Bilirubin Urine: NEGATIVE
Glucose, UA: NEGATIVE mg/dL
Ketones, ur: NEGATIVE mg/dL
Leukocytes, UA: NEGATIVE
Nitrite: NEGATIVE
PROTEIN: 100 mg/dL — AB
SPECIFIC GRAVITY, URINE: 1.015 (ref 1.005–1.030)
pH: 7.5 (ref 5.0–8.0)

## 2015-10-07 MED ORDER — OXYCODONE-ACETAMINOPHEN 5-325 MG PO TABS
1.0000 | ORAL_TABLET | Freq: Once | ORAL | Status: AC
Start: 2015-10-07 — End: 2015-10-07
  Administered 2015-10-07: 1 via ORAL
  Filled 2015-10-07: qty 1

## 2015-10-07 MED ORDER — IOHEXOL 300 MG/ML  SOLN
80.0000 mL | Freq: Once | INTRAMUSCULAR | Status: AC | PRN
  Administered 2015-10-07: 80 mL via INTRAVENOUS

## 2015-10-07 MED ORDER — ONDANSETRON 8 MG PO TBDP
8.0000 mg | ORAL_TABLET | Freq: Once | ORAL | Status: AC
  Administered 2015-10-07: 8 mg via ORAL

## 2015-10-07 MED ORDER — ONDANSETRON HCL 4 MG PO TABS: 4.0000 mg | ORAL_TABLET | Freq: Three times a day (TID) | ORAL | Status: DC | PRN

## 2015-10-07 MED ORDER — TAMSULOSIN HCL 0.4 MG PO CAPS
0.4000 mg | ORAL_CAPSULE | Freq: Once | ORAL | Status: AC
  Administered 2015-10-07: 0.4 mg via ORAL
  Filled 2015-10-07: qty 1

## 2015-10-07 MED ORDER — OXYCODONE-ACETAMINOPHEN 5-325 MG PO TABS: 1.0000 | ORAL_TABLET | ORAL | Status: DC | PRN

## 2015-10-07 MED ORDER — TAMSULOSIN HCL 0.4 MG PO CAPS: 0.4000 mg | ORAL_CAPSULE | Freq: Every day | ORAL | Status: DC

## 2015-10-07 MED ORDER — IOHEXOL 240 MG/ML SOLN
25.0000 mL | Freq: Once | INTRAMUSCULAR | Status: AC | PRN
  Administered 2015-10-07: 25 mL via ORAL

## 2015-10-07 MED ORDER — ONDANSETRON HCL 4 MG PO TABS
4.0000 mg | ORAL_TABLET | Freq: Once | ORAL | Status: AC
  Administered 2015-10-07: 4 mg via ORAL
  Filled 2015-10-07: qty 1

## 2015-10-07 MED ORDER — KETOROLAC TROMETHAMINE 60 MG/2ML IM SOLN
60.0000 mg | Freq: Once | INTRAMUSCULAR | Status: AC
  Administered 2015-10-07: 60 mg via INTRAMUSCULAR

## 2015-10-07 NOTE — ED Notes (Addendum)
Pt sent from MD office for further eval.  Reports abd pain, n/v and hematuria. Was given zofran and toradol there and states the pain in much better.

## 2015-10-07 NOTE — ED Notes (Signed)
Patient c/o left sided abdominal pain with N/V that started Tuesday.  Patient denies fevers. Patient denies diarrhea.

## 2015-10-07 NOTE — Discharge Instructions (Signed)
Please call the urology office tomorrow morning. Please do not eat or drink anything after midnight tonight. Please seek medical attention for any high fevers, chest pain, shortness of breath, change in behavior, persistent vomiting, bloody stool or any other new or concerning symptoms.   Kidney Stones Kidney stones (urolithiasis) are deposits that form inside your kidneys. The intense pain is caused by the stone moving through the urinary tract. When the stone moves, the ureter goes into spasm around the stone. The stone is usually passed in the urine.  CAUSES   A disorder that makes certain neck glands produce too much parathyroid hormone (primary hyperparathyroidism).  A buildup of uric acid crystals, similar to gout in your joints.  Narrowing (stricture) of the ureter.  A kidney obstruction present at birth (congenital obstruction).  Previous surgery on the kidney or ureters.  Numerous kidney infections. SYMPTOMS   Feeling sick to your stomach (nauseous).  Throwing up (vomiting).  Blood in the urine (hematuria).  Pain that usually spreads (radiates) to the groin.  Frequency or urgency of urination. DIAGNOSIS   Taking a history and physical exam.  Blood or urine tests.  CT scan.  Occasionally, an examination of the inside of the urinary bladder (cystoscopy) is performed. TREATMENT   Observation.  Increasing your fluid intake.  Extracorporeal shock wave lithotripsy--This is a noninvasive procedure that uses shock waves to break up kidney stones.  Surgery may be needed if you have severe pain or persistent obstruction. There are various surgical procedures. Most of the procedures are performed with the use of small instruments. Only small incisions are needed to accommodate these instruments, so recovery time is minimized. The size, location, and chemical composition are all important variables that will determine the proper choice of action for you. Talk to your  health care provider to better understand your situation so that you will minimize the risk of injury to yourself and your kidney.  HOME CARE INSTRUCTIONS   Drink enough water and fluids to keep your urine clear or pale yellow. This will help you to pass the stone or stone fragments.  Strain all urine through the provided strainer. Keep all particulate matter and stones for your health care provider to see. The stone causing the pain may be as small as a grain of salt. It is very important to use the strainer each and every time you pass your urine. The collection of your stone will allow your health care provider to analyze it and verify that a stone has actually passed. The stone analysis will often identify what you can do to reduce the incidence of recurrences.  Only take over-the-counter or prescription medicines for pain, discomfort, or fever as directed by your health care provider.  Keep all follow-up visits as told by your health care provider. This is important.  Get follow-up X-rays if required. The absence of pain does not always mean that the stone has passed. It may have only stopped moving. If the urine remains completely obstructed, it can cause loss of kidney function or even complete destruction of the kidney. It is your responsibility to make sure X-rays and follow-ups are completed. Ultrasounds of the kidney can show blockages and the status of the kidney. Ultrasounds are not associated with any radiation and can be performed easily in a matter of minutes.  Make changes to your daily diet as told by your health care provider. You may be told to:  Limit the amount of salt that you eat.  Eat 5 or more servings of fruits and vegetables each day.  Limit the amount of meat, poultry, fish, and eggs that you eat.  Collect a 24-hour urine sample as told by your health care provider.You may need to collect another urine sample every 6-12 months. SEEK MEDICAL CARE IF:  You  experience pain that is progressive and unresponsive to any pain medicine you have been prescribed. SEEK IMMEDIATE MEDICAL CARE IF:   Pain cannot be controlled with the prescribed medicine.  You have a fever or shaking chills.  The severity or intensity of pain increases over 18 hours and is not relieved by pain medicine.  You develop a new onset of abdominal pain.  You feel faint or pass out.  You are unable to urinate.   This information is not intended to replace advice given to you by your health care provider. Make sure you discuss any questions you have with your health care provider.   Document Released: 07/31/2005 Document Revised: 04/21/2015 Document Reviewed: 01/01/2013 Elsevier Interactive Patient Education [employer].

## 2015-10-07 NOTE — ED Provider Notes (Signed)
HPI  SUBJECTIVE:  Pt with left-sided abdominal pain for the past 2 days. She describes it as throbbing, constant, with no radiation or migration. She states the pain is maximal left lower quadrant. States it is getting worse. Has been trying Gas-X, Tums, Pepto-Bismol, ginger ale. Symptoms are better with lying still, worse with sitting up, walking, moving around. She states that the car ride over here was painful. She states that she was having back pain prior to the abdominal pain, back pain is still present. She reports nausea and 2 episodes of nonbilious nonbloody emesis this morning. She reports chills, but no fevers. Reports generalized weakness, but no syncope. She has not had a bowel movement today. She denies chest pain, shortness of breath, palpitations, coughing, wheezing. No abdominal distention, melena, hematochezia. She reports urinary frequency, but no dysuria, urgency, cloudy or odorous urine, hematuria. No vaginal bleeding, vaginal discharge, odor, genital rash. No sick contacts, no change in medications. She is not sexually active. Last contact was 9 months ago with her husband.  No ETOH abuse, pt is not a smoker. Past nuchal history of hypertension, status post appendectomy, ex-lap for endometriosis. Status post left hip replacement, left knee replacement. She states that she is no longer on the Xarelto. She is not on any anticoagulants or antiplatelets currently. No h/o MI,  DM, CAD, afib, CHF. No h/o biliary disease, cirrhosis, pancreatitis, diverticulitis, diverticulosis, obstruction, GIB. No h/o nephrolithiasis, AAA. No h/o STD, PID, ovarian cysts. No h/o CA, DVT/PE, hypercoagulability No h/o UC, IBS. Pt has a colonoscopy 5 years ago, which was normal except for some benign polyps.   Blood pressure noted. States that she has not taken her medications today.  Past Medical History  Diagnosis Date  . Hypertension   . Hyperlipidemia   . Parathyroid tumor   . Osteoarthritis of knee     bil-gets injections  . GERD (gastroesophageal reflux disease)   . Fibromyalgia   . Numbness     right hand/comes and goes   . Wears glasses   . History of urinary tract infection   . Urinary frequency     Past Surgical History  Procedure Laterality Date  . Appendectomy    . Laparoscopic endometriosis fulguration    . Knee arthroscopy      left  . Total knee arthroplasty Left 06/29/2014    Procedure: TOTAL KNEE ARTHROPLASTY LEFT ;  Surgeon: Gearlean Alf, MD;  Location: [facility];  Service: Orthopedics;  Laterality: Left;  . Total hip arthroplasty Left 08/04/2015    Procedure: TOTAL LEFT  HIP ARTHROPLASTY ANTERIOR APPROACH;  Surgeon: Gaynelle Arabian, MD;  Location: [facility];  Service: Orthopedics;  Laterality: Left;    Family History  Problem Relation Age of Onset  . Colon cancer Neg Hx     Social History  Substance Use Topics  . Smoking status: Never Smoker   . Smokeless tobacco: Never Used  . Alcohol Use: No    No current facility-administered medications for this encounter.  Current outpatient prescriptions:  .  amLODipine (NORVASC) 5 MG tablet, Take 5 mg by mouth daily. , Disp: , Rfl:  .  fexofenadine (ALLEGRA) 180 MG tablet, Take 180 mg by mouth daily as needed for allergies. , Disp: , Rfl:  .  irbesartan (AVAPRO) 300 MG tablet, Take 300 mg by mouth every morning., Disp: , Rfl:  .  pantoprazole (PROTONIX) 40 MG tablet, Take 40 mg by mouth daily.  , Disp: , Rfl:  .  pravastatin (PRAVACHOL)  20 MG tablet, Take 20 mg by mouth daily., Disp: , Rfl: 0 .  traMADol (ULTRAM) 50 MG tablet, Take 1-2 tablets (50-100 mg total) by mouth every 6 (six) hours as needed (mild pain)., Disp: 80 tablet, Rfl: 1  Allergies  Allergen Reactions  . Atorvastatin Other (See Comments)    REACTION: leg cramp     ROS  As noted in HPI.   Physical Exam  BP 149/87 mmHg  Pulse 82  Temp(Src) 98.1 F (36.7 C) (Tympanic)  Resp 16  Ht 5\' 8"  (1.727 m)  Wt 194 lb (87.998 kg)  BMI 29.50 kg/m2   SpO2 97%   Constitutional: Well developed, well nourished, appears uncomfortable. Pain aggravated with movement Eyes: PERRL, EOMI, conjunctiva normal bilaterally HENT: Normocephalic, atraumatic,mucus membranes moist Respiratory: Clear to auscultation bilaterally, no rales, no wheezing, no rhonchi Cardiovascular: Normal rate and rhythm, no murmurs, no gallops, no rubs GI: Left lower quadrant tenderness, suprapubic tenderness. Soft, nondistended, normal bowel sounds, no rebound, no guarding Rectal: No gross blood. Normal rectal tone. Minimal stool in vault. Back: L CVAT skin: No rash, skin intact Musculoskeletal: No edema, no tenderness, no deformities Neurologic: Alert & oriented x 3, CN II-XII grossly intact, no motor deficits, sensation grossly intact Psychiatric: Speech and behavior appropriate   ED Course  Medications  ketorolac (TORADOL) injection 60 mg (60 mg Intramuscular Given 10/07/15 1244)  ondansetron (ZOFRAN-ODT) disintegrating tablet 8 mg (8 mg Oral Given 10/07/15 1245)    Orders Placed This Encounter  Procedures  . Urinalysis complete, with microscopic    Standing Status: Standing     Number of Occurrences: 1     Standing Expiration Date:   . Occult blood card to lab, stool Provider will collect    Standing Status: Standing     Number of Occurrences: 1     Standing Expiration Date:     Order Specific Question:  Specimen to be collected by?    Answer:  Provider will collect   Results for orders placed or performed during the hospital encounter of 10/07/15 (from the past 24 hour(s))  Urinalysis complete, with microscopic     Status: Abnormal   Collection Time: 10/07/15 11:53 AM  Result Value Ref Range   Color, Urine YELLOW YELLOW   APPearance CLEAR CLEAR   Glucose, UA NEGATIVE NEGATIVE mg/dL   Bilirubin Urine NEGATIVE NEGATIVE   Ketones, ur NEGATIVE NEGATIVE mg/dL   Specific Gravity, Urine 1.015 1.005 - 1.030   Hgb urine dipstick 2+ (A) NEGATIVE   pH 7.5 5.0 -  8.0   Protein, ur 100 (A) NEGATIVE mg/dL   Nitrite NEGATIVE NEGATIVE   Leukocytes, UA NEGATIVE NEGATIVE   RBC / HPF TOO NUMEROUS TO COUNT 0 - 5 RBC/hpf   WBC, UA 0-5 0 - 5 WBC/hpf   Bacteria, UA RARE (A) NONE SEEN   Squamous Epithelial / LPF 0-5 (A) NONE SEEN  Occult blood card to lab, stool Provider will collect     Status: None   Collection Time: 10/07/15 12:51 PM  Result Value Ref Range   Fecal Occult Bld NEGATIVE NEGATIVE   No results found.  ED Clinical Impression  Left lower quadrant pain  Hematuria  ED Assessment/Plan  Pt was initially given toradol,  Zofran. Hematuria noted, but pain would be atypical for kidney stone. Concern for perforated diverticula as patient has some history concerning for peritoneal symptoms.  Reevaluation, patient states she feels better after the medications. Repeat blood pressure after taking home medications 149/87.  Transferring to the ER for comprehensive workup. Feel that patient is stable to go by private vehicle. She agrees with going immediately to the ED. Husband will take her. Gave report to ED.  *This clinic note was created using Dragon dictation software. Therefore, there may be occasional mistakes despite careful proofreading.  ?    Melynda Ripple, MD 10/07/15 1340

## 2015-10-07 NOTE — ED Notes (Signed)
Report given to Iris, RN.

## 2015-10-07 NOTE — ED Notes (Signed)
Pt discharged to home.  Family member driving.  Discharge instructions reviewed.  Verbalized understanding.  No questions or concerns at this time.  Teach back verified.  Pt in NAD.  No items left in ED.   

## 2015-10-07 NOTE — Discharge Instructions (Signed)
go to the emergency room immediately. We need to make sure that you do not have a perforated diverticula or some other cause of your symptoms  that would require surgery today.

## 2015-10-08 NOTE — ED Provider Notes (Signed)
[facility] Emergency Department Provider Note    ____________________________________________  Time seen: ~1725  I have reviewed the triage vital signs and the nursing notes.   HISTORY  Chief Complaint Abdominal Pain   History limited by: Not Limited   HPI Alexandra Gibson is a 65 y.o. female who presents from urgent care today because of concerns forleft-sided abdominal pain. This has been going on for the past couple of days. It is on the left side of the abdomen. It will wax and wane in severity. Currently it is mild to moderate. At urgent care there was some concern for potential perforated diverticulitis however urine showed multiple red blood cells. Patient denies any fevers.    Past Medical History  Diagnosis Date  . Hypertension   . Hyperlipidemia   . Parathyroid tumor   . Osteoarthritis of knee     bil-gets injections  . GERD (gastroesophageal reflux disease)   . Fibromyalgia   . Numbness     right hand/comes and goes   . Wears glasses   . History of urinary tract infection   . Urinary frequency     Patient Active Problem List   Diagnosis Date Noted  . OA (osteoarthritis) of hip 08/04/2015  . OA (osteoarthritis) of knee 06/29/2014  . BENIGN NEOPLASM OF PARATHYROID GLAND 12/13/2007  . HYPERCALCEMIA 08/12/2007  . RESTLESS LEGS SYNDROME 08/12/2007  . CARPAL TUNNEL SYNDROME, BILATERAL 07/04/2007  . OSTEOARTHRITIS, MULTIPLE JOINTS 07/04/2007  . HYPERCHOLESTEROLEMIA 10/11/2006  . DEPRESSION, MAJOR, RECURRENT 10/11/2006  . HYPERTENSION, BENIGN SYSTEMIC 10/11/2006  . GASTROESOPHAGEAL REFLUX, NO ESOPHAGITIS 10/11/2006  . MENOPAUSAL SYNDROME 10/11/2006  . Myalgia and myositis 10/11/2006    Past Surgical History  Procedure Laterality Date  . Appendectomy    . Laparoscopic endometriosis fulguration    . Knee arthroscopy      left  . Total knee arthroplasty Left 06/29/2014    Procedure: TOTAL KNEE ARTHROPLASTY LEFT ;  Surgeon: Gearlean Alf, MD;  Location: [facility];  Service: Orthopedics;  Laterality: Left;  . Total hip arthroplasty Left 08/04/2015    Procedure: TOTAL LEFT  HIP ARTHROPLASTY ANTERIOR APPROACH;  Surgeon: Gaynelle Arabian, MD;  Location: [facility];  Service: Orthopedics;  Laterality: Left;    Current Outpatient Rx  Name  Route  Sig  Dispense  Refill  . amLODipine (NORVASC) 5 MG tablet   Oral   Take 5 mg by mouth daily.          . fexofenadine (ALLEGRA) 180 MG tablet   Oral   Take 180 mg by mouth daily as needed for allergies.          Marland Kitchen irbesartan (AVAPRO) 300 MG tablet   Oral   Take 300 mg by mouth every morning.         . ondansetron (ZOFRAN) 4 MG tablet   Oral   Take 1 tablet (4 mg total) by mouth every 8 (eight) hours as needed for nausea or vomiting.   20 tablet   0   . oxyCODONE-acetaminophen (ROXICET) 5-325 MG tablet   Oral   Take 1 tablet by mouth every 4 (four) hours as needed for severe pain.   15 tablet   0   . pantoprazole (PROTONIX) 40 MG tablet   Oral   Take 40 mg by mouth daily.           . pravastatin (PRAVACHOL) 20 MG tablet   Oral   Take 20 mg by mouth daily.  0   . tamsulosin (FLOMAX) 0.4 MG CAPS capsule   Oral   Take 1 capsule (0.4 mg total) by mouth daily.   14 capsule   0   . traMADol (ULTRAM) 50 MG tablet   Oral   Take 1-2 tablets (50-100 mg total) by mouth every 6 (six) hours as needed (mild pain).   80 tablet   1     Allergies Atorvastatin  Family History  Problem Relation Age of Onset  . Colon cancer Neg Hx     Social History Social History  Substance Use Topics  . Smoking status: Never Smoker   . Smokeless tobacco: Never Used  . Alcohol Use: No    Review of Systems  Constitutional: Negative for fever. Cardiovascular: Negative for chest pain. Respiratory: Negative for shortness of breath. Gastrointestinal: Left-sided abdominal pain. Neurological: Negative for headaches, focal weakness or numbness.  10-point ROS otherwise  negative.  ____________________________________________   PHYSICAL EXAM:  VITAL SIGNS: ED Triage Vitals  Enc Vitals Group     BP 10/07/15 1356 140/80 mmHg     Pulse Rate 10/07/15 1356 92     Resp 10/07/15 1356 16     Temp 10/07/15 1356 98.6 F (37 C)     Temp Source 10/07/15 1356 Oral     SpO2 10/07/15 1356 97 %     Weight --      Height --      Head Cir --      Peak Flow --      Pain Score 10/07/15 1358 1   Constitutional: Alert and oriented. Well appearing and in no distress. Eyes: Conjunctivae are normal. PERRL. Normal extraocular movements. ENT   Head: Normocephalic and atraumatic.   Nose: No congestion/rhinnorhea.   Mouth/Throat: Mucous membranes are moist.   Neck: No stridor. Hematological/Lymphatic/Immunilogical: No cervical lymphadenopathy. Cardiovascular: Normal rate, regular rhythm.  No murmurs, rubs, or gallops. Respiratory: Normal respiratory effort without tachypnea nor retractions. Breath sounds are clear and equal bilaterally. No wheezes/rales/rhonchi. Gastrointestinal: Soft and nontender. No distention. There is no CVA tenderness. Genitourinary: Deferred Musculoskeletal: Normal range of motion in all extremities. No joint effusions.  No lower extremity tenderness nor edema. Neurologic:  Normal speech and language. No gross focal neurologic deficits are appreciated.  Skin:  Skin is warm, dry and intact. No rash noted. Psychiatric: Mood and affect are normal. Speech and behavior are normal. Patient exhibits appropriate insight and judgment.  ____________________________________________    LABS (pertinent positives/negatives)  Labs Reviewed  CBC WITH DIFFERENTIAL/PLATELET - Abnormal; Notable for the following:    WBC 15.0 (*)    Neutro Abs 12.2 (*)    Monocytes Absolute 1.0 (*)    All other components within normal limits  COMPREHENSIVE METABOLIC PANEL - Abnormal; Notable for the following:    Glucose, Bld 117 (*)    BUN 21 (*)     Creatinine, Ser 1.36 (*)    Calcium 11.4 (*)    Total Protein 8.4 (*)    ALT 12 (*)    GFR calc non Af Amer 40 (*)    GFR calc Af Amer 46 (*)    All other components within normal limits     ____________________________________________   EKG  None  ____________________________________________    RADIOLOGY  Renal US IMPRESSION: Suggestion of a 1.1 cm stone within the expected location the proximal left ureter with resultant moderate left hydronephrosis. Consider correlation with renal stone CT protocol.  CT abd/pel IMPRESSION: Left hydronephrosis with left perinephric  stranding due to obstruction by 6 mm stone in the proximal left ureter.   ____________________________________________   PROCEDURES  Procedure(s) performed: None  Critical Care performed: No  ____________________________________________   INITIAL IMPRESSION / ASSESSMENT AND PLAN / ED COURSE  Pertinent labs & imaging results that were available during my care of the patient were reviewed by me and considered in my medical decision making (see chart for details).  Patient presented to the emergency department today because of concerns for left-sided abdominal pain. Patient was seen earlier and had a UA which showed multiple red blood cells. Because of this renal ultrasound was performed which was suggestive of a 1.1 cm stone. A CT scan was done which showed a 6 mm stone in the proximal left ureter. I discussed this with urologist on call. Plan to have patient follow up with urology tomorrow morning. I will send an electronic message. Discussed plan with patient who is comfortable.  ____________________________________________   FINAL CLINICAL IMPRESSION(S) / ED DIAGNOSES  Final diagnoses:  Kidney stone     Nance Pear, MD 10/08/15 2152

## 2015-10-11 ENCOUNTER — Ambulatory Visit (INDEPENDENT_AMBULATORY_CARE_PROVIDER_SITE_OTHER): Payer: Federal, State, Local not specified - PPO | Admitting: Urology

## 2015-10-11 ENCOUNTER — Encounter: Payer: Self-pay | Admitting: Urology

## 2015-10-11 VITALS — BP 114/76 | HR 94 | Ht 68.0 in | Wt 191.2 lb

## 2015-10-11 DIAGNOSIS — N1330 Unspecified hydronephrosis: Secondary | ICD-10-CM | POA: Diagnosis not present

## 2015-10-11 DIAGNOSIS — N201 Calculus of ureter: Secondary | ICD-10-CM | POA: Diagnosis not present

## 2015-10-11 DIAGNOSIS — R3129 Other microscopic hematuria: Secondary | ICD-10-CM | POA: Diagnosis not present

## 2015-10-11 MED ORDER — ONDANSETRON HCL 4 MG PO TABS: 4.0000 mg | ORAL_TABLET | Freq: Three times a day (TID) | ORAL | Status: DC | PRN

## 2015-10-11 MED ORDER — TAMSULOSIN HCL 0.4 MG PO CAPS: 0.4000 mg | ORAL_CAPSULE | Freq: Every day | ORAL | Status: DC

## 2015-10-11 MED ORDER — OXYCODONE-ACETAMINOPHEN 5-325 MG PO TABS: 1.0000 | ORAL_TABLET | ORAL | Status: DC | PRN

## 2015-10-11 NOTE — Progress Notes (Signed)
10/11/2015 4:18 PM   Alexandra Gibson 1950/08/26 [id number]  Referring provider: Thressa Sheller, MD [address] [place], [place] [address]  Chief Complaint  Patient presents with  . Nephrolithiasis    ER referral    HPI: Patient is a 65 year old African American female who presents today for follow up after being seen at [facility] ED for a ureteral stone.  Patient had been having left-sided abdominal pain.  She is been having this pain on and off for the last couple of days. She states is a waxing and waning in severity.  She has been having nausea and vomiting.  A CT scan performed in the ED noted a left 6 mm proximal ureteral stone associated with left hydronephrosis with left perinephric stranding.    I have personally reviewed the films with the patient.    At today's exam the pain is 10 out of 10.  She actually is taking the pain medication to ease the pain in the room.  Her urinalysis is positive for microscopic hematuria.  She is currently not nauseous or vomiting.  She denies fevers or chills.    She does not have a prior history of nephrolithiasis.  Her baseline urinary symptoms are frequent urination, nocturia and leakage of urine.  PMH: Past Medical History  Diagnosis Date  . Hypertension   . Hyperlipidemia   . Parathyroid tumor   . Osteoarthritis of knee     bil-gets injections  . GERD (gastroesophageal reflux disease)   . Fibromyalgia   . Numbness     right hand/comes and goes   . Wears glasses   . History of urinary tract infection   . Urinary frequency   . Anxiety and depression   . Arthritis     Surgical History: Past Surgical History  Procedure Laterality Date  . Appendectomy    . Laparoscopic endometriosis fulguration    . Knee arthroscopy      left  . Total knee arthroplasty Left 06/29/2014    Procedure: TOTAL KNEE ARTHROPLASTY LEFT ;  Surgeon: Gearlean Alf, MD;  Location: [facility] ORS;  Service: Orthopedics;  Laterality: Left;    . Total hip arthroplasty Left 08/04/2015    Procedure: TOTAL LEFT  HIP ARTHROPLASTY ANTERIOR APPROACH;  Surgeon: Gaynelle Arabian, MD;  Location: [facility] ORS;  Service: Orthopedics;  Laterality: Left;    Home Medications:    Medication List       This list is accurate as of: 10/11/15  4:18 PM.  Always use your most recent med list.               amLODipine 5 MG tablet  Commonly known as:  NORVASC  Take 5 mg by mouth daily.     fexofenadine 180 MG tablet  Commonly known as:  ALLEGRA  Take 180 mg by mouth daily as needed for allergies.     irbesartan 300 MG tablet  Commonly known as:  AVAPRO  Take 300 mg by mouth every morning.     methocarbamol 500 MG tablet  Commonly known as:  ROBAXIN  Reported on 10/11/2015     ondansetron 4 MG tablet  Commonly known as:  ZOFRAN  Take 1 tablet (4 mg total) by mouth every 8 (eight) hours as needed for nausea or vomiting.     oxyCODONE 5 MG immediate release tablet  Commonly known as:  Oxy IR/ROXICODONE  Reported on 10/11/2015     oxyCODONE-acetaminophen 5-325 MG tablet  Commonly  known as:  ROXICET  Take 1 tablet by mouth every 4 (four) hours as needed for severe pain.     pantoprazole 40 MG tablet  Commonly known as:  PROTONIX  Take 40 mg by mouth daily.     pravastatin 20 MG tablet  Commonly known as:  PRAVACHOL  Take 20 mg by mouth daily.     tamsulosin 0.4 MG Caps capsule  Commonly known as:  FLOMAX  Take 1 capsule (0.4 mg total) by mouth daily.     traMADol 50 MG tablet  Commonly known as:  ULTRAM  Take 1-2 tablets (50-100 mg total) by mouth every 6 (six) hours as needed (mild pain).        Allergies:  Allergies  Allergen Reactions  . Atorvastatin Other (See Comments)    REACTION: leg cramp    Family History: Family History  Problem Relation Age of Onset  . Colon cancer Neg Hx   . Kidney disease Neg Hx   . Bladder Cancer Neg Hx     Social History:  reports that she has never smoked. She has never used smokeless  tobacco. She reports that she does not drink alcohol or use illicit drugs.  ROS: UROLOGY Frequent Urination?: Yes Hard to postpone urination?: No Burning/pain with urination?: No Get up at night to urinate?: Yes Leakage of urine?: Yes Urine stream starts and stops?: No Trouble starting stream?: No Do you have to strain to urinate?: No Blood in urine?: Yes Urinary tract infection?: No Sexually transmitted disease?: No Injury to kidneys or bladder?: No Painful intercourse?: No Weak stream?: No Currently pregnant?: No Vaginal bleeding?: No Last menstrual period?: n  Gastrointestinal Nausea?: Yes Vomiting?: Yes Indigestion/heartburn?: Yes Diarrhea?: No Constipation?: No  Constitutional Fever: No Night sweats?: Yes Weight loss?: No Fatigue?: Yes  Skin Skin rash/lesions?: No Itching?: No  Eyes Blurred vision?: Yes Double vision?: No  Ears/Nose/Throat Sore throat?: No Sinus problems?: No  Hematologic/Lymphatic Swollen glands?: No Easy bruising?: Yes  Cardiovascular Leg swelling?: No Chest pain?: No  Respiratory Cough?: No Shortness of breath?: No  Endocrine Excessive thirst?: No  Musculoskeletal Back pain?: Yes Joint pain?: Yes  Neurological Headaches?: No Dizziness?: No  Psychologic Depression?: Yes Anxiety?: No  Physical Exam: BP 114/76 mmHg  Pulse 94  Ht 5\' 8"  (1.727 m)  Wt 191 lb 3.2 oz (86.728 kg)  BMI 29.08 kg/m2  Constitutional: Well nourished. Alert and oriented, No acute distress. HEENT: [place] AT, moist mucus membranes. Trachea midline, no masses. Cardiovascular: No clubbing, cyanosis, or edema. Respiratory: Normal respiratory effort, no increased work of breathing. GI: Abdomen is soft, non tender, non distended, no abdominal masses. Liver and spleen not palpable.  No hernias appreciated.  Stool sample for occult testing is not indicated.   GU: Left  CVA tenderness.  No bladder fullness or masses.   Skin: No rashes, bruises or  suspicious lesions. Lymph: No cervical or inguinal adenopathy. Neurologic: Grossly intact, no focal deficits, moving all 4 extremities. Psychiatric: Normal mood and affect.  Laboratory Data: Lab Results  Component Value Date   WBC 15.0* 10/07/2015   HGB 13.9 10/07/2015   HCT 42.9 10/07/2015   MCV 88.2 10/07/2015   PLT 290 10/07/2015    Lab Results  Component Value Date   CREATININE 1.36* 10/07/2015       Component Value Date/Time   CHOL 186 04/18/2007 2111   HDL 51 04/18/2007 2111   CHOLHDL 3.6 Ratio 04/18/2007 2111   VLDL 31 04/18/2007 2111  [place] 104* 04/18/2007 2111    Lab Results  Component Value Date   AST 22 10/07/2015   Lab Results  Component Value Date   ALT 12* 10/07/2015    Urinalysis Results for orders placed or performed in visit on 10/11/15  Microscopic Examination  Result Value Ref Range   WBC, UA >30 (A) 0 -  5 /hpf   RBC, UA >30 (A) 0 -  2 /hpf   Epithelial Cells (non renal) >10 (A) 0 - 10 /hpf   Bacteria, UA None seen None seen/Few  Urinalysis, Complete  Result Value Ref Range   Specific Gravity, UA >1.030 (H) 1.005 - 1.030   pH, UA 5.0 5.0 - 7.5   Color, UA Yellow Yellow   Appearance Ur Cloudy (A) Clear   Leukocytes, UA 1+ (A) Negative   Protein, UA 1+ (A) Negative/Trace   Glucose, UA Negative Negative   Ketones, UA Negative Negative   RBC, UA 3+ (A) Negative   Bilirubin, UA Negative Negative   Urobilinogen, Ur 0.2 0.2 - 1.0 mg/dL   Nitrite, UA Negative Negative   Microscopic Examination See below:     Pertinent Imaging: CLINICAL DATA: Left abdomen pain starting Tuesday.  EXAM: CT ABDOMEN AND PELVIS WITH CONTRAST  TECHNIQUE: Multidetector CT imaging of the abdomen and pelvis was performed using the standard protocol following bolus administration of intravenous contrast.  CONTRAST: 16mL OMNIPAQUE IOHEXOL 300 MG/ML SOLN  COMPARISON: None.  FINDINGS: The liver is normal. There is no focal liver lesion. The  gallbladder is normal. The biliary tree is normal.  The spleen is normal. The pancreas is normal.  The adrenal glands are normal. There is left hydronephrosis with left perinephric stranding due to obstruction by 6 mm stone in the proximal left ureter. There is a 1.8 cm cyst in the mid to lower pole right kidney. There is no right hydronephrosis.  There is atherosclerosis of the abdominal aorta without aneurysmal dilatation. There is no abdominal lymphadenopathy.  There is minimal umbilical herniation mesenteric fat.  There is no small bowel obstruction or diverticulitis. The patient status post prior appendectomy.  Partial fluid-filled bladder is normal. The uterus is unremarkable.  Left hip replacement is identified. Degenerative joint changes of the spine are noted. There is minimal atelectasis of the posterior lung bases.  IMPRESSION: Left hydronephrosis with left perinephric stranding due to obstruction by 6 mm stone in the proximal left ureter.   Electronically Signed  By: Abelardo Diesel M.D.  On: 10/07/2015 18:38   Assessment & Plan:   Patient with a 6 mm obstructing left proximal ureteral stone wishes to undergo left ureteroscopy with left laser lithotripsy with left ureteral stent placement for definitive treatment.  1. Left ureteral stone:   Patient was found to have a left proximal ureteral stone causing obstruction.  We discussed ESWL versus URS.  She would like to pursue left ureteroscopy with left laser lithotripsy with left ureteral stent placement.  I explained to the patient how the procedure is performed and the risks involved.  I informed patient that she will have a stent placed during the procedure and will remain in place after the procedure for a short time.  It will be removed in the office with a cystoscope, unless a string in left in place.  I informed that patient that about 50% of patients who undergo ureteroscopy and have a stent will  have "stent pain," and this is by far the most common risk/complaint following ureteroscopy. A stent is  a soft plastic tube (about half the size of IV tubing) that allows the kidney to drain to the bladder regardless of edema or obstruction. Not only can the stent "rub" on the inside of the bladder, causing a feeling of needing to urinate/overactive bladder, but also the stent allows urine to pass up from the bladder to the kidney during urination - causing symptoms from a warm, tingling sensation to intense pain in the affected flank.  They may be residual stones within the kidney or ureter may be present up to 40% of the time following ureteroscopy, depending on the original stone size and location. These stone fragments will be seen and addressed on follow-up imaging.  Injury to the ureter is the most common intra-operative complication during ureteroscopy. The reported risk of perforation ranges greatly, depending on whether it is defined as a complete perforation (0.1-0.7% - think of this as a hole through the entire ureter), a partial perforation (1.6% - a hole nearly through the entire ureter), or mucosal tear/scrape (5% - these are similar to a sore on the inside of the mouth). Almost 100% of these will heal with prolonged stenting (anywhere between 2 - 4 weeks). Should a large perforation occur, your urologist may chose to stop the procedure and return on another day when the ureter has had time to heal.  I also explained the risks of general anesthesia, such as: MI, CVA, paralysis, coma and/or death.  - Urinalysis, Complete - CULTURE, URINE COMPREHENSIVE  She is instructed to seek treatment in the emergency room if she should develop fevers/chills or vomiting/pain that is intractable.  2. Left hydronephrosis:   Patient was found to have left hydronephrosis due to an left ureteral stone.  A RUS will be obtained one month after patient has underwent URS for left ureteral stone to ensure the  hydronephrosis has resolved.    3. Microscopic hematuria:   We will continue to monitor the patient's urine after she received definitive treatment for the left ureteral stone to ensure the hematuria resolves.  Return for schedule left URS/LL/left ureteral stent placement.  These notes generated with voice recognition software. I apologize for typographical errors.  Zara Council, [facility] [address], [place] [place], [place] [address] [phone]

## 2015-10-12 ENCOUNTER — Telehealth: Payer: Self-pay | Admitting: Radiology

## 2015-10-12 LAB — MICROSCOPIC EXAMINATION
BACTERIA UA: NONE SEEN
RBC, UA: >30 /hpf — AB (ref 0–?)
WBC, UA: >30 /hpf — AB (ref 0–?)

## 2015-10-12 LAB — URINALYSIS, COMPLETE
Bilirubin, UA: NEGATIVE
Glucose, UA: NEGATIVE
KETONES UA: NEGATIVE
Nitrite, UA: NEGATIVE
PH UA: 5 (ref 5.0–7.5)
Urobilinogen, Ur: 0.2 mg/dL (ref 0.2–1.0)

## 2015-10-12 NOTE — Telephone Encounter (Signed)
Notified pt of surgery scheduled 10/27/15, pre-admit testing appt on 10/18/15 @8 :15 & to call day prior to surgery for arrival time to [facility]. Advised pt that she may come by the office to get a strainer if she would like so she can collect stone if it were to pass prior to surgery. Advised pt to contact office if she feels she has passed the stone prior to surgery. Pt voices understanding.

## 2015-10-13 LAB — CULTURE, URINE COMPREHENSIVE

## 2015-10-18 ENCOUNTER — Encounter
Admission: RE | Admit: 2015-10-18 | Discharge: 2015-10-18 | Disposition: A | Discharge status: Home / Self Care | Payer: Federal, State, Local not specified - PPO | Source: Ambulatory Visit | Attending: Urology | Admitting: Urology

## 2015-10-18 DIAGNOSIS — Z01812 Encounter for preprocedural laboratory examination: Secondary | ICD-10-CM | POA: Diagnosis not present

## 2015-10-18 LAB — CBC
HCT: 40 % (ref 35.0–47.0)
Hemoglobin: 12.9 g/dL (ref 12.0–16.0)
MCH: 28.9 pg (ref 26.0–34.0)
MCHC: 32.4 g/dL (ref 32.0–36.0)
MCV: 89.3 fL (ref 80.0–100.0)
PLATELETS: 350 10*3/uL (ref 150–440)
RBC: 4.48 MIL/uL (ref 3.80–5.20)
RDW: 14.3 % (ref 11.5–14.5)
WBC: 10.6 10*3/uL (ref 3.6–11.0)

## 2015-10-18 LAB — BASIC METABOLIC PANEL
Anion gap: 9 (ref 5–15)
BUN: 15 mg/dL (ref 6–20)
CALCIUM: 10.9 mg/dL — AB (ref 8.9–10.3)
CO2: 27 mmol/L (ref 22–32)
CREATININE: 1.22 mg/dL — AB (ref 0.44–1.00)
Chloride: 105 mmol/L (ref 101–111)
GFR calc Af Amer: 53 mL/min — ABNORMAL LOW (ref >60)
GFR, EST NON AFRICAN AMERICAN: 45 mL/min — AB (ref >60)
Glucose, Bld: 93 mg/dL (ref 65–99)
POTASSIUM: 4.4 mmol/L (ref 3.5–5.1)
SODIUM: 141 mmol/L (ref 135–145)

## 2015-10-18 NOTE — Patient Instructions (Signed)
  Your procedure is scheduled on: October 27, 2015 (Wednesday) Report to Day Surgery.[facility]) Second Floor To find out your arrival time please call [phone] between 1PM - 3PM on October 26, 2015 (Tuesday).  Remember: Instructions that are not followed completely may result in serious medical risk, up to and including death, or upon the discretion of your surgeon and anesthesiologist your surgery may need to be rescheduled.    __x__ 1. Do not eat food or drink liquids after midnight. No gum chewing or hard candies.     ____ 2. No Alcohol for 24 hours before or after surgery.   ____ 3. Bring all medications with you on the day of surgery if instructed.    __x__ 4. Notify your doctor if there is any change in your medical condition     (cold, fever, infections).     Do not wear jewelry, make-up, hairpins, clips or nail polish.  Do not wear lotions, powders, or perfumes. You may wear deodorant.  Do not shave 48 hours prior to surgery. Men may shave face and neck.  Do not bring valuables to the hospital.    [facility] is not responsible for any belongings or valuables.               Contacts, dentures or bridgework may not be worn into surgery.  Leave your suitcase in the car. After surgery it may be brought to your room.  For patients admitted to the hospital, discharge time is determined by your                treatment team.   Patients discharged the day of surgery will not be allowed to drive home.   Please read over the following fact sheets that you were given:   Surgical Site Infection Prevention   ____ Take these medicines the morning of surgery with A SIP OF WATER:    1. Amlodipine  2. Irbesartan  3. Pravastatin  4.Pantoprazole (Pantoprazole at bedtime on March 14)  5.  6.  ____ Fleet Enema (as directed)   ____ Use CHG Soap as directed  ____ Use inhalers on the day of surgery  ____ Stop metformin 2 days prior to surgery    ____ Take 1/2 of usual insulin  dose the night before surgery and none on the morning of surgery.   __x__ Stop Coumadin/Plavix/aspirin on  (N/A)  _x___ Stop Anti-inflammatories on (NO NSAIDS)   ____ Stop supplements until after surgery.    ____ Bring C-Pap to the hospital.

## 2015-10-27 ENCOUNTER — Ambulatory Visit: Payer: Federal, State, Local not specified - PPO | Admitting: *Deleted

## 2015-10-27 ENCOUNTER — Encounter: Payer: Self-pay | Admitting: *Deleted

## 2015-10-27 ENCOUNTER — Ambulatory Visit
Admission: RE | Admit: 2015-10-27 | Discharge: 2015-10-27 | Disposition: A | Discharge status: Home / Self Care | Payer: Federal, State, Local not specified - PPO | Source: Ambulatory Visit | Attending: Urology | Admitting: Urology

## 2015-10-27 ENCOUNTER — Telehealth: Payer: Self-pay

## 2015-10-27 ENCOUNTER — Encounter
Admission: RE | Disposition: A | Discharge status: Home / Self Care | Payer: Self-pay | Source: Ambulatory Visit | Attending: Urology

## 2015-10-27 DIAGNOSIS — F419 Anxiety disorder, unspecified: Secondary | ICD-10-CM | POA: Insufficient documentation

## 2015-10-27 DIAGNOSIS — Z96642 Presence of left artificial hip joint: Secondary | ICD-10-CM | POA: Diagnosis not present

## 2015-10-27 DIAGNOSIS — R350 Frequency of micturition: Secondary | ICD-10-CM | POA: Diagnosis not present

## 2015-10-27 DIAGNOSIS — F329 Major depressive disorder, single episode, unspecified: Secondary | ICD-10-CM | POA: Insufficient documentation

## 2015-10-27 DIAGNOSIS — I10 Essential (primary) hypertension: Secondary | ICD-10-CM | POA: Diagnosis not present

## 2015-10-27 DIAGNOSIS — N132 Hydronephrosis with renal and ureteral calculous obstruction: Secondary | ICD-10-CM | POA: Diagnosis not present

## 2015-10-27 DIAGNOSIS — R112 Nausea with vomiting, unspecified: Secondary | ICD-10-CM | POA: Insufficient documentation

## 2015-10-27 DIAGNOSIS — Z96652 Presence of left artificial knee joint: Secondary | ICD-10-CM | POA: Diagnosis not present

## 2015-10-27 DIAGNOSIS — E785 Hyperlipidemia, unspecified: Secondary | ICD-10-CM | POA: Insufficient documentation

## 2015-10-27 DIAGNOSIS — R200 Anesthesia of skin: Secondary | ICD-10-CM | POA: Insufficient documentation

## 2015-10-27 DIAGNOSIS — R109 Unspecified abdominal pain: Secondary | ICD-10-CM | POA: Insufficient documentation

## 2015-10-27 DIAGNOSIS — Z888 Allergy status to other drugs, medicaments and biological substances status: Secondary | ICD-10-CM | POA: Diagnosis not present

## 2015-10-27 DIAGNOSIS — N201 Calculus of ureter: Secondary | ICD-10-CM | POA: Diagnosis not present

## 2015-10-27 DIAGNOSIS — Z79899 Other long term (current) drug therapy: Secondary | ICD-10-CM | POA: Insufficient documentation

## 2015-10-27 DIAGNOSIS — K219 Gastro-esophageal reflux disease without esophagitis: Secondary | ICD-10-CM | POA: Insufficient documentation

## 2015-10-27 DIAGNOSIS — N1330 Unspecified hydronephrosis: Secondary | ICD-10-CM | POA: Diagnosis not present

## 2015-10-27 DIAGNOSIS — R3129 Other microscopic hematuria: Secondary | ICD-10-CM | POA: Diagnosis not present

## 2015-10-27 DIAGNOSIS — M797 Fibromyalgia: Secondary | ICD-10-CM | POA: Insufficient documentation

## 2015-10-27 HISTORY — PX: URETEROSCOPY WITH HOLMIUM LASER LITHOTRIPSY: SHX6645

## 2015-10-27 HISTORY — PX: CYSTOSCOPY WITH STENT PLACEMENT: SHX5790

## 2015-10-27 SURGERY — URETEROSCOPY, WITH LITHOTRIPSY USING HOLMIUM LASER
Anesthesia: General | Laterality: Left

## 2015-10-27 MED ORDER — CEFAZOLIN SODIUM-DEXTROSE 2-3 GM-% IV SOLR
INTRAVENOUS | Status: AC
  Filled 2015-10-27: qty 50

## 2015-10-27 MED ORDER — ONDANSETRON HCL 4 MG/2ML IJ SOLN
4.0000 mg | Freq: Once | INTRAMUSCULAR | Status: DC | PRN
Start: 2015-10-27 — End: 2015-10-27

## 2015-10-27 MED ORDER — CEPHALEXIN 500 MG PO CAPS: 500.0000 mg | ORAL_CAPSULE | Freq: Three times a day (TID) | ORAL | Status: DC

## 2015-10-27 MED ORDER — LIDOCAINE HCL (CARDIAC) 20 MG/ML IV SOLN
INTRAVENOUS | Status: DC | PRN
  Administered 2015-10-27: 60 mg via INTRAVENOUS

## 2015-10-27 MED ORDER — LACTATED RINGERS IV SOLN
INTRAVENOUS | Status: DC
  Administered 2015-10-27: 11:00:00 via INTRAVENOUS

## 2015-10-27 MED ORDER — HYDROMORPHONE HCL 1 MG/ML IJ SOLN
INTRAMUSCULAR | Status: AC
  Administered 2015-10-27: 0.5 mg via INTRAVENOUS
  Filled 2015-10-27: qty 1

## 2015-10-27 MED ORDER — DEXAMETHASONE SODIUM PHOSPHATE 10 MG/ML IJ SOLN
INTRAMUSCULAR | Status: DC | PRN
  Administered 2015-10-27: 10 mg via INTRAVENOUS

## 2015-10-27 MED ORDER — CEFAZOLIN SODIUM-DEXTROSE 2-3 GM-% IV SOLR
2.0000 g | Freq: Once | INTRAVENOUS | Status: AC
  Administered 2015-10-27: 2 g via INTRAVENOUS

## 2015-10-27 MED ORDER — FENTANYL CITRATE (PF) 100 MCG/2ML IJ SOLN
INTRAMUSCULAR | Status: DC | PRN
  Administered 2015-10-27: 50 ug via INTRAVENOUS

## 2015-10-27 MED ORDER — MIDAZOLAM HCL 5 MG/5ML IJ SOLN
INTRAMUSCULAR | Status: DC | PRN
  Administered 2015-10-27: 2 mg via INTRAVENOUS

## 2015-10-27 MED ORDER — ONDANSETRON HCL 4 MG/2ML IJ SOLN
INTRAMUSCULAR | Status: DC | PRN
  Administered 2015-10-27: 4 mg via INTRAVENOUS

## 2015-10-27 MED ORDER — OXYCODONE-ACETAMINOPHEN 5-325 MG PO TABS
1.0000 | ORAL_TABLET | ORAL | Status: DC | PRN
Start: 2015-10-27 — End: 2016-09-20

## 2015-10-27 MED ORDER — HYDROMORPHONE HCL 1 MG/ML IJ SOLN
0.2500 mg | INTRAMUSCULAR | Status: DC | PRN
  Administered 2015-10-27 (×4): 0.5 mg via INTRAVENOUS

## 2015-10-27 MED ORDER — EPHEDRINE SULFATE 50 MG/ML IJ SOLN
INTRAMUSCULAR | Status: DC | PRN
  Administered 2015-10-27: 10 mg via INTRAVENOUS

## 2015-10-27 SURGICAL SUPPLY — 33 items
BACTOSHIELD CHG 4% 4OZ (MISCELLANEOUS) ×1
BASKET ZERO TIP 1.9FR (BASKET) ×1 IMPLANT
BSKT STON RTRVL ZERO TP 1.9FR (BASKET) ×1
CATH URETL 5X70 OPEN END (CATHETERS) ×2 IMPLANT
CNTNR SPEC 2.5X3XGRAD LEK (MISCELLANEOUS) ×1
CONT SPEC 4OZ STER OR WHT (MISCELLANEOUS) ×1
CONT SPEC 4OZ STRL OR WHT (MISCELLANEOUS) ×1
CONTAINER SPEC 2.5X3XGRAD LEK (MISCELLANEOUS) ×1 IMPLANT
FEE TECHNICIAN ONLY PER HOUR (MISCELLANEOUS) ×1 IMPLANT
GLOVE BIO SURGEON STRL SZ7 (GLOVE) ×4 IMPLANT
GLOVE BIO SURGEON STRL SZ7.5 (GLOVE) ×2 IMPLANT
GOWN STRL REUS W/ TWL LRG LVL4 (GOWN DISPOSABLE) ×1 IMPLANT
GOWN STRL REUS W/TWL LRG LVL4 (GOWN DISPOSABLE) ×2
GOWN STRL REUS W/TWL XL LVL3 (GOWN DISPOSABLE) ×2 IMPLANT
GUIDEWIRE SUPER STIFF (WIRE) IMPLANT
KIT RM TURNOVER CYSTO AR (KITS) ×2 IMPLANT
LASER FIBER 200M SMARTSCOPE (Laser) ×2 IMPLANT
LASER FIBER 365M SMARTSCOPE (Laser) ×1 IMPLANT
LASER HOLMIUM FIBER SU 272UM (MISCELLANEOUS) ×1 IMPLANT
PACK CYSTO AR (MISCELLANEOUS) ×2 IMPLANT
SCRUB CHG 4% DYNA-HEX 4OZ (MISCELLANEOUS) ×1 IMPLANT
SENSORWIRE 0.038 NOT ANGLED (WIRE) ×2
SET CYSTO W/LG BORE CLAMP LF (SET/KITS/TRAYS/PACK) ×2 IMPLANT
SHEATH URETERAL 13/15X36 1L (SHEATH) IMPLANT
SOL .9 NS 3000ML IRR  AL (IV SOLUTION) ×1
SOL .9 NS 3000ML IRR AL (IV SOLUTION) ×1
SOL .9 NS 3000ML IRR UROMATIC (IV SOLUTION) ×1 IMPLANT
STENT URET 6FRX24 CONTOUR (STENTS) ×1 IMPLANT
STENT URET 6FRX26 CONTOUR (STENTS) ×2 IMPLANT
SURGILUBE 2OZ TUBE FLIPTOP (MISCELLANEOUS) ×2 IMPLANT
SYRINGE IRR TOOMEY STRL 70CC (SYRINGE) ×2 IMPLANT
WATER STERILE IRR 1000ML POUR (IV SOLUTION) ×2 IMPLANT
WIRE SENSOR 0.038 NOT ANGLED (WIRE) ×1 IMPLANT

## 2015-10-27 NOTE — Anesthesia Preprocedure Evaluation (Signed)
Anesthesia Evaluation  Patient identified by MRN, date of birth, ID band Patient awake    Reviewed: Allergy & Precautions, NPO status , Patient's Chart, lab work & pertinent test results  Airway Mallampati: I  TM Distance: >3 FB Neck ROM: Full    Dental  (+) Partial Lower, Teeth Intact   Pulmonary    Pulmonary exam normal        Cardiovascular Exercise Tolerance: Good hypertension, Pt. on medications Normal cardiovascular exam     Neuro/Psych Depression Occasional numbness in hand. Suspect carpal tunnel ds?    GI/Hepatic GERD  Medicated and Controlled,  Endo/Other    Renal/GU Renal InsufficiencyRenal diseaseStones and creat. 1.22.     Musculoskeletal  (+) Arthritis , Osteoarthritis,  Fibromyalgia -  Abdominal Normal abdominal exam  (+)  Abdomen: soft.    Peds  Hematology   Anesthesia Other Findings   Reproductive/Obstetrics                             Anesthesia Physical Anesthesia Plan  ASA: II  Anesthesia Plan: General   Post-op Pain Management:    Induction: Intravenous  Airway Management Planned: LMA  Additional Equipment:   Intra-op Plan:   Post-operative Plan: Extubation in OR  Informed Consent: I have reviewed the patients History and Physical, chart, labs and discussed the procedure including the risks, benefits and alternatives for the proposed anesthesia with the patient or authorized representative who has indicated his/her understanding and acceptance.     Plan Discussed with: CRNA  Anesthesia Plan Comments:         Anesthesia Quick Evaluation

## 2015-10-27 NOTE — Telephone Encounter (Signed)
appt has been made dr. Pilar Jarvis needs to put in the RUS order   Thanks, michelle

## 2015-10-27 NOTE — Interval H&P Note (Signed)
History and Physical Interval Note:  10/27/2015 11:25 AM  Alexandra Gibson  has presented today for surgery, with the diagnosis of LEFT URETERAL STONE,HYDRONEPHROSIS  The various methods of treatment have been discussed with the patient and family. After consideration of risks, benefits and other options for treatment, the patient has consented to  Procedure(s): URETEROSCOPY WITH HOLMIUM LASER LITHOTRIPSY (Left) CYSTOSCOPY WITH STENT PLACEMENT (Left) as a surgical intervention .  The patient's history has been reviewed, patient examined, no change in status, stable for surgery.  I have reviewed the patient's chart and labs.  Questions were answered to the patient's satisfaction.    RRR Unlabored resp  Nickie Retort

## 2015-10-27 NOTE — Telephone Encounter (Signed)
-----   Message from Nickie Retort, MD sent at 10/27/2015 12:20 PM EDT ----- Patient needs to f/u in one month with a renal ultrasound just prior to appt.

## 2015-10-27 NOTE — Anesthesia Procedure Notes (Signed)
Procedure Name: LMA Insertion Date/Time: 10/27/2015 11:46 AM Performed by: Marsh Dolly Pre-anesthesia Checklist: Patient identified, Patient being monitored, Timeout performed, Emergency Drugs available and Suction available Patient Re-evaluated:Patient Re-evaluated prior to inductionOxygen Delivery Method: Circle system utilized Preoxygenation: Pre-oxygenation with 100% oxygen Intubation Type: IV induction Ventilation: Mask ventilation without difficulty LMA: LMA inserted Tube type: Oral Tube size: 3.5 mm Number of attempts: 1 Placement Confirmation: positive ETCO2 and breath sounds checked- equal and bilateral Tube secured with: Tape Dental Injury: Teeth and Oropharynx as per pre-operative assessment

## 2015-10-27 NOTE — Anesthesia Postprocedure Evaluation (Signed)
Anesthesia Post Note  Patient: Alexandra Gibson  Procedure(s) Performed: Procedure(s) (LRB): URETEROSCOPY WITH HOLMIUM LASER LITHOTRIPSY (Left) CYSTOSCOPY WITH STENT PLACEMENT (Left)  Patient location during evaluation: PACU Anesthesia Type: General Level of consciousness: awake Pain management: pain level controlled Vital Signs Assessment: post-procedure vital signs reviewed and stable Respiratory status: spontaneous breathing Cardiovascular status: blood pressure returned to baseline Postop Assessment: no headache Anesthetic complications: no    Last Vitals:  Filed Vitals:   10/27/15 1031  BP: 153/88  Pulse: 83  Temp: 36.4 C  Resp: 16    Last Pain: There were no vitals filed for this visit.               Chestina Komatsu M

## 2015-10-27 NOTE — Transfer of Care (Signed)
Immediate Anesthesia Transfer of Care Note  Patient: Alexandra Gibson  Procedure(s) Performed: Procedure(s): URETEROSCOPY WITH HOLMIUM LASER LITHOTRIPSY (Left) CYSTOSCOPY WITH STENT PLACEMENT (Left)  Patient Location: PACU  Anesthesia Type:General  Level of Consciousness: awake, alert  and oriented  Airway & Oxygen Therapy: Patient Spontanous Breathing  Post-op Assessment: Report given to RN and Post -op Vital signs reviewed and stable  Post vital signs: Reviewed and stable  Last Vitals:  Filed Vitals:   10/27/15 1031  BP: 153/88  Pulse: 83  Temp: 36.4 C  Resp: 16    Complications: No apparent anesthesia complications

## 2015-10-27 NOTE — Discharge Instructions (Signed)

## 2015-10-27 NOTE — H&P (View-Only) (Signed)
10/11/2015 4:18 PM   Alexandra Gibson Jan 12, 1951 [id number]  Referring provider: Thressa Sheller, MD [address], [place], [place] [address]  Chief Complaint  Patient presents with  . Nephrolithiasis    ER referral    HPI: Patient is a 65 year old African American female who presents today for follow up after being seen at [facility] ED for a ureteral stone.  Patient had been having left-sided abdominal pain.  She is been having this pain on and off for the last couple of days. She states is a waxing and waning in severity.  She has been having nausea and vomiting.  A CT scan performed in the ED noted a left 6 mm proximal ureteral stone associated with left hydronephrosis with left perinephric stranding.    I have personally reviewed the films with the patient.    At today's exam the pain is 10 out of 10.  She actually is taking the pain medication to ease the pain in the room.  Her urinalysis is positive for microscopic hematuria.  She is currently not nauseous or vomiting.  She denies fevers or chills.    She does not have a prior history of nephrolithiasis.  Her baseline urinary symptoms are frequent urination, nocturia and leakage of urine.  PMH: Past Medical History  Diagnosis Date  . Hypertension   . Hyperlipidemia   . Parathyroid tumor   . Osteoarthritis of knee     bil-gets injections  . GERD (gastroesophageal reflux disease)   . Fibromyalgia   . Numbness     right hand/comes and goes   . Wears glasses   . History of urinary tract infection   . Urinary frequency   . Anxiety and depression   . Arthritis     Surgical History: Past Surgical History  Procedure Laterality Date  . Appendectomy    . Laparoscopic endometriosis fulguration    . Knee arthroscopy      left  . Total knee arthroplasty Left 06/29/2014    Procedure: TOTAL KNEE ARTHROPLASTY LEFT ;  Surgeon: Gearlean Alf, MD;  Location: [facility];  Service: Orthopedics;  Laterality: Left;    . Total hip arthroplasty Left 08/04/2015    Procedure: TOTAL LEFT  HIP ARTHROPLASTY ANTERIOR APPROACH;  Surgeon: Gaynelle Arabian, MD;  Location: [facility];  Service: Orthopedics;  Laterality: Left;    Home Medications:    Medication List       This list is accurate as of: 10/11/15  4:18 PM.  Always use your most recent med list.               amLODipine 5 MG tablet  Commonly known as:  NORVASC  Take 5 mg by mouth daily.     fexofenadine 180 MG tablet  Commonly known as:  ALLEGRA  Take 180 mg by mouth daily as needed for allergies.     irbesartan 300 MG tablet  Commonly known as:  AVAPRO  Take 300 mg by mouth every morning.     methocarbamol 500 MG tablet  Commonly known as:  ROBAXIN  Reported on 10/11/2015     ondansetron 4 MG tablet  Commonly known as:  ZOFRAN  Take 1 tablet (4 mg total) by mouth every 8 (eight) hours as needed for nausea or vomiting.     oxyCODONE 5 MG immediate release tablet  Commonly known as:  Oxy IR/ROXICODONE  Reported on 10/11/2015     oxyCODONE-acetaminophen 5-325 MG tablet  Commonly  known as:  ROXICET  Take 1 tablet by mouth every 4 (four) hours as needed for severe pain.     pantoprazole 40 MG tablet  Commonly known as:  PROTONIX  Take 40 mg by mouth daily.     pravastatin 20 MG tablet  Commonly known as:  PRAVACHOL  Take 20 mg by mouth daily.     tamsulosin 0.4 MG Caps capsule  Commonly known as:  FLOMAX  Take 1 capsule (0.4 mg total) by mouth daily.     traMADol 50 MG tablet  Commonly known as:  ULTRAM  Take 1-2 tablets (50-100 mg total) by mouth every 6 (six) hours as needed (mild pain).        Allergies:  Allergies  Allergen Reactions  . Atorvastatin Other (See Comments)    REACTION: leg cramp    Family History: Family History  Problem Relation Age of Onset  . Colon cancer Neg Hx   . Kidney disease Neg Hx   . Bladder Cancer Neg Hx     Social History:  reports that she has never smoked. She has never used smokeless  tobacco. She reports that she does not drink alcohol or use illicit drugs.  ROS: UROLOGY Frequent Urination?: Yes Hard to postpone urination?: No Burning/pain with urination?: No Get up at night to urinate?: Yes Leakage of urine?: Yes Urine stream starts and stops?: No Trouble starting stream?: No Do you have to strain to urinate?: No Blood in urine?: Yes Urinary tract infection?: No Sexually transmitted disease?: No Injury to kidneys or bladder?: No Painful intercourse?: No Weak stream?: No Currently pregnant?: No Vaginal bleeding?: No Last menstrual period?: n  Gastrointestinal Nausea?: Yes Vomiting?: Yes Indigestion/heartburn?: Yes Diarrhea?: No Constipation?: No  Constitutional Fever: No Night sweats?: Yes Weight loss?: No Fatigue?: Yes  Skin Skin rash/lesions?: No Itching?: No  Eyes Blurred vision?: Yes Double vision?: No  Ears/Nose/Throat Sore throat?: No Sinus problems?: No  Hematologic/Lymphatic Swollen glands?: No Easy bruising?: Yes  Cardiovascular Leg swelling?: No Chest pain?: No  Respiratory Cough?: No Shortness of breath?: No  Endocrine Excessive thirst?: No  Musculoskeletal Back pain?: Yes Joint pain?: Yes  Neurological Headaches?: No Dizziness?: No  Psychologic Depression?: Yes Anxiety?: No  Physical Exam: BP 114/76 mmHg  Pulse 94  Ht 5\' 8"  (1.727 m)  Wt 191 lb 3.2 oz (86.728 kg)  BMI 29.08 kg/m2  Constitutional: Well nourished. Alert and oriented, No acute distress. HEENT: [place] AT, moist mucus membranes. Trachea midline, no masses. Cardiovascular: No clubbing, cyanosis, or edema. Respiratory: Normal respiratory effort, no increased work of breathing. GI: Abdomen is soft, non tender, non distended, no abdominal masses. Liver and spleen not palpable.  No hernias appreciated.  Stool sample for occult testing is not indicated.   GU: Left  CVA tenderness.  No bladder fullness or masses.   Skin: No rashes, bruises or  suspicious lesions. Lymph: No cervical or inguinal adenopathy. Neurologic: Grossly intact, no focal deficits, moving all 4 extremities. Psychiatric: Normal mood and affect.  Laboratory Data: Lab Results  Component Value Date   WBC 15.0* 10/07/2015   HGB 13.9 10/07/2015   HCT 42.9 10/07/2015   MCV 88.2 10/07/2015   PLT 290 10/07/2015    Lab Results  Component Value Date   CREATININE 1.36* 10/07/2015       Component Value Date/Time   CHOL 186 04/18/2007 2111   HDL 51 04/18/2007 2111   CHOLHDL 3.6 Ratio 04/18/2007 2111   VLDL 31 04/18/2007 2111  [place] 104* 04/18/2007 2111    Lab Results  Component Value Date   AST 22 10/07/2015   Lab Results  Component Value Date   ALT 12* 10/07/2015    Urinalysis Results for orders placed or performed in visit on 10/11/15  Microscopic Examination  Result Value Ref Range   WBC, UA >30 (A) 0 -  5 /hpf   RBC, UA >30 (A) 0 -  2 /hpf   Epithelial Cells (non renal) >10 (A) 0 - 10 /hpf   Bacteria, UA None seen None seen/Few  Urinalysis, Complete  Result Value Ref Range   Specific Gravity, UA >1.030 (H) 1.005 - 1.030   pH, UA 5.0 5.0 - 7.5   Color, UA Yellow Yellow   Appearance Ur Cloudy (A) Clear   Leukocytes, UA 1+ (A) Negative   Protein, UA 1+ (A) Negative/Trace   Glucose, UA Negative Negative   Ketones, UA Negative Negative   RBC, UA 3+ (A) Negative   Bilirubin, UA Negative Negative   Urobilinogen, Ur 0.2 0.2 - 1.0 mg/dL   Nitrite, UA Negative Negative   Microscopic Examination See below:     Pertinent Imaging: CLINICAL DATA: Left abdomen pain starting Tuesday.  EXAM: CT ABDOMEN AND PELVIS WITH CONTRAST  TECHNIQUE: Multidetector CT imaging of the abdomen and pelvis was performed using the standard protocol following bolus administration of intravenous contrast.  CONTRAST: 49mL OMNIPAQUE IOHEXOL 300 MG/ML SOLN  COMPARISON: None.  FINDINGS: The liver is normal. There is no focal liver lesion. The  gallbladder is normal. The biliary tree is normal.  The spleen is normal. The pancreas is normal.  The adrenal glands are normal. There is left hydronephrosis with left perinephric stranding due to obstruction by 6 mm stone in the proximal left ureter. There is a 1.8 cm cyst in the mid to lower pole right kidney. There is no right hydronephrosis.  There is atherosclerosis of the abdominal aorta without aneurysmal dilatation. There is no abdominal lymphadenopathy.  There is minimal umbilical herniation mesenteric fat.  There is no small bowel obstruction or diverticulitis. The patient status post prior appendectomy.  Partial fluid-filled bladder is normal. The uterus is unremarkable.  Left hip replacement is identified. Degenerative joint changes of the spine are noted. There is minimal atelectasis of the posterior lung bases.  IMPRESSION: Left hydronephrosis with left perinephric stranding due to obstruction by 6 mm stone in the proximal left ureter.   Electronically Signed  By: Abelardo Diesel M.D.  On: 10/07/2015 18:38   Assessment & Plan:   Patient with a 6 mm obstructing left proximal ureteral stone wishes to undergo left ureteroscopy with left laser lithotripsy with left ureteral stent placement for definitive treatment.  1. Left ureteral stone:   Patient was found to have a left proximal ureteral stone causing obstruction.  We discussed ESWL versus URS.  She would like to pursue left ureteroscopy with left laser lithotripsy with left ureteral stent placement.  I explained to the patient how the procedure is performed and the risks involved.  I informed patient that she will have a stent placed during the procedure and will remain in place after the procedure for a short time.  It will be removed in the office with a cystoscope, unless a string in left in place.  I informed that patient that about 50% of patients who undergo ureteroscopy and have a stent will  have "stent pain," and this is by far the most common risk/complaint following ureteroscopy. A stent is  a soft plastic tube (about half the size of IV tubing) that allows the kidney to drain to the bladder regardless of edema or obstruction. Not only can the stent "rub" on the inside of the bladder, causing a feeling of needing to urinate/overactive bladder, but also the stent allows urine to pass up from the bladder to the kidney during urination - causing symptoms from a warm, tingling sensation to intense pain in the affected flank.  They may be residual stones within the kidney or ureter may be present up to 40% of the time following ureteroscopy, depending on the original stone size and location. These stone fragments will be seen and addressed on follow-up imaging.  Injury to the ureter is the most common intra-operative complication during ureteroscopy. The reported risk of perforation ranges greatly, depending on whether it is defined as a complete perforation (0.1-0.7% - think of this as a hole through the entire ureter), a partial perforation (1.6% - a hole nearly through the entire ureter), or mucosal tear/scrape (5% - these are similar to a sore on the inside of the mouth). Almost 100% of these will heal with prolonged stenting (anywhere between 2 - 4 weeks). Should a large perforation occur, your urologist may chose to stop the procedure and return on another day when the ureter has had time to heal.  I also explained the risks of general anesthesia, such as: MI, CVA, paralysis, coma and/or death.  - Urinalysis, Complete - CULTURE, URINE COMPREHENSIVE  She is instructed to seek treatment in the emergency room if she should develop fevers/chills or vomiting/pain that is intractable.  2. Left hydronephrosis:   Patient was found to have left hydronephrosis due to an left ureteral stone.  A RUS will be obtained one month after patient has underwent URS for left ureteral stone to ensure the  hydronephrosis has resolved.    3. Microscopic hematuria:   We will continue to monitor the patient's urine after she received definitive treatment for the left ureteral stone to ensure the hematuria resolves.  Return for schedule left URS/LL/left ureteral stent placement.  These notes generated with voice recognition software. I apologize for typographical errors.  Zara Council, [facility] [address], [place], [place] [address] [phone]

## 2015-10-27 NOTE — Op Note (Signed)
Date of procedure: 10/27/2015  Preoperative diagnosis:  1. Left ureteral stone   Postoperative diagnosis:  1. Left ureteral stone   Procedure: 1. Cystoscopy 2. Left ureteroscopy 3. Laser lithotripsy 4. Stone basketing 5. Left ureteral stent placement (6 [place] by 26 cm)  Surgeon: Baruch Gouty, MD  Anesthesia: General  Complications: None  Intraoperative findings: Stone was found the proximal ureter that was broken to small fragments and dust. One piece was removed for pathology.  EBL: None  Specimens: Left ureteral stone  Drains: 6 French by 26 cm left double-J ureteral stent with string attached  Disposition: Stable to the postanesthesia care unit  Indication for procedure: The patient is a 65 y.o. female with 6 mm proximal left ureteral stone and presents today for definitive stone management.  After reviewing the management options for treatment, the patient elected to proceed with the above surgical procedure(s). We have discussed the potential benefits and risks of the procedure, side effects of the proposed treatment, the likelihood of the patient achieving the goals of the procedure, and any potential problems that might occur during the procedure or recuperation. Informed consent has been obtained.  Description of procedure: The patient was met in the preoperative area. All risks, benefits, and indications of the procedure were described in great detail. The patient consented to the procedure. Preoperative antibiotics were given. The patient was taken to the operative theater. General anesthesia was induced per the anesthesia service. The patient was then placed in the dorsal lithotomy position and prepped and draped in the usual sterile fashion. A preoperative timeout was called. A 21 French 30 cystoscope was inserted in the patient's bladder per urethra atraumatically. A sensor wire was placed into the left ureter level of the left renal pelvis under fluoroscopy.  Cystoscope was withdrawn submerging ureter scope was inserted in the patient's left ureter and with the help second sensor wire for navigation, was advanced to level of the proximal ureter with a stone encounter. He was broken into small fragments. These fragments were all small left the past. One of these wires however was basketed and sent to pathology. Ureteroscope was then withdrawn. A 6 French by 26 cm double-J ureteral stent was placed over the sensor wire through the cystoscope. A sensor wire was removed. The stent was confirmed in the correct location with a curl seen in the patient's renal pelvis on fluoroscopy and a curl seen in the urinary bladder direct visualization. The cystoscope was withdrawn after emptying the bladder. There was a string still attached to the stent. Fluoroscopically after removing the cystoscope confirmed a curl in the still present in the patient's bladder. The string was affixed the patient's life. The patient was woke from anesthesia and transferred stable condition post care unit.  Plan: The patient will pull the string to remove her stent in 2 days. She will follow-up in one month with a renal ultrasound prior.  Baruch Gouty, M.D.

## 2015-11-01 ENCOUNTER — Other Ambulatory Visit: Payer: Self-pay

## 2015-11-01 DIAGNOSIS — N201 Calculus of ureter: Secondary | ICD-10-CM

## 2015-11-01 DIAGNOSIS — N1330 Unspecified hydronephrosis: Secondary | ICD-10-CM

## 2015-11-01 LAB — STONE ANALYSIS
Ca Oxalate,Dihydrate: 35 %
Ca Oxalate,Monohydr.: 55 %
Ca phos cry stone ql IR: 10 %
STONE WEIGHT KSTONE: 6 mg

## 2015-11-01 NOTE — Telephone Encounter (Signed)
RUS order placed. 

## 2015-11-16 ENCOUNTER — Ambulatory Visit
Admission: RE | Admit: 2015-11-16 | Discharge: 2015-11-16 | Disposition: A | Discharge status: Home / Self Care | Payer: Federal, State, Local not specified - PPO | Source: Ambulatory Visit | Attending: Urology | Admitting: Urology

## 2015-11-16 DIAGNOSIS — N1330 Unspecified hydronephrosis: Secondary | ICD-10-CM | POA: Diagnosis not present

## 2015-11-16 DIAGNOSIS — N201 Calculus of ureter: Secondary | ICD-10-CM

## 2015-11-25 ENCOUNTER — Encounter: Payer: Self-pay | Admitting: Urology

## 2015-11-25 ENCOUNTER — Ambulatory Visit: Payer: Federal, State, Local not specified - PPO | Admitting: Urology

## 2015-12-02 ENCOUNTER — Encounter: Payer: Self-pay | Admitting: Urology

## 2015-12-02 ENCOUNTER — Ambulatory Visit (INDEPENDENT_AMBULATORY_CARE_PROVIDER_SITE_OTHER): Payer: Federal, State, Local not specified - PPO | Admitting: Urology

## 2015-12-02 VITALS — BP 138/86 | HR 67 | Ht 68.0 in | Wt 187.0 lb

## 2015-12-02 DIAGNOSIS — N201 Calculus of ureter: Secondary | ICD-10-CM | POA: Diagnosis not present

## 2015-12-02 NOTE — Progress Notes (Signed)
12/02/2015 10:42 AM   Alexandra Gibson 1951/04/04 [id number]  Referring provider: Thressa Sheller, MD [address] [place], [place] [address]  Chief Complaint  Patient presents with  . Follow-up    Hydronephrosis of left kidney     HPI: The patient is a 65 year old female with a past metal history of a 6 mm left proximal stone status post ureteroscopy, laser lithotripsy, removal 1 month ago. She returns today for her postoperative ultrasound which is unremarkable.  Her stone consists of 35% calcium oxalate dihydrate, 55% calcium oxide monohydrate, and temperature and calcium phosphate.  She removed her stent without difficulties postoperatively. She is doing well since surgery.  PMH: Past Medical History  Diagnosis Date  . Hypertension   . Hyperlipidemia   . Parathyroid tumor   . Osteoarthritis of knee     bil-gets injections  . GERD (gastroesophageal reflux disease)   . Fibromyalgia   . Numbness     right hand/comes and goes   . Wears glasses   . History of urinary tract infection   . Urinary frequency   . Anxiety and depression   . Arthritis   . Chronic kidney disease     Kidney stone    Surgical History: Past Surgical History  Procedure Laterality Date  . Appendectomy    . Laparoscopic endometriosis fulguration    . Knee arthroscopy      left  . Total knee arthroplasty Left 06/29/2014    Procedure: TOTAL KNEE ARTHROPLASTY LEFT ;  Surgeon: Gearlean Alf, MD;  Location: [facility];  Service: Orthopedics;  Laterality: Left;  . Total hip arthroplasty Left 08/04/2015    Procedure: TOTAL LEFT  HIP ARTHROPLASTY ANTERIOR APPROACH;  Surgeon: Gaynelle Arabian, MD;  Location: [facility];  Service: Orthopedics;  Laterality: Left;  . Joint replacement    . Ureteroscopy with holmium laser lithotripsy Left 10/27/2015    Procedure: URETEROSCOPY WITH HOLMIUM LASER LITHOTRIPSY;  Surgeon: Nickie Retort, MD;  Location: [facility] ORS;  Service: Urology;  Laterality: Left;    . Cystoscopy with stent placement Left 10/27/2015    Procedure: CYSTOSCOPY WITH STENT PLACEMENT;  Surgeon: Nickie Retort, MD;  Location: [facility] ORS;  Service: Urology;  Laterality: Left;    Home Medications:    Medication List       This list is accurate as of: 12/02/15 10:42 AM.  Always use your most recent med list.               amLODipine 5 MG tablet  Commonly known as:  NORVASC  Take 5 mg by mouth daily.     celecoxib 200 MG capsule  Commonly known as:  CELEBREX  Take 200 mg by mouth daily.     cephALEXin 500 MG capsule  Commonly known as:  KEFLEX  Take 1 capsule (500 mg total) by mouth 3 (three) times daily.     fexofenadine 180 MG tablet  Commonly known as:  ALLEGRA  Take 180 mg by mouth daily as needed for allergies.     irbesartan 300 MG tablet  Commonly known as:  AVAPRO  Take 300 mg by mouth every morning.     ondansetron 4 MG tablet  Commonly known as:  ZOFRAN  Take 1 tablet (4 mg total) by mouth every 8 (eight) hours as needed for nausea or vomiting.     oxyCODONE-acetaminophen 5-325 MG tablet  Commonly known as:  ROXICET  Take 1 tablet by mouth every 4 (four) hours as  needed for severe pain.     pantoprazole 40 MG tablet  Commonly known as:  PROTONIX  Take 40 mg by mouth daily.     pravastatin 20 MG tablet  Commonly known as:  PRAVACHOL  Take 20 mg by mouth daily.     traMADol 50 MG tablet  Commonly known as:  ULTRAM  Take 1-2 tablets (50-100 mg total) by mouth every 6 (six) hours as needed (mild pain).        Allergies:  Allergies  Allergen Reactions  . Atorvastatin Other (See Comments)    REACTION: leg cramp    Family History: Family History  Problem Relation Age of Onset  . Colon cancer Neg Hx   . Kidney disease Neg Hx   . Bladder Cancer Neg Hx   . Hypertension Mother     Social History:  reports that she has never smoked. She has never used smokeless tobacco. She reports that she does not drink alcohol or use illicit  drugs.  ROS: UROLOGY Frequent Urination?: No Hard to postpone urination?: No Burning/pain with urination?: No Get up at night to urinate?: No Leakage of urine?: Yes Urine stream starts and stops?: No Trouble starting stream?: No Do you have to strain to urinate?: No Blood in urine?: No Urinary tract infection?: No Sexually transmitted disease?: No Injury to kidneys or bladder?: No Painful intercourse?: No Weak stream?: No Currently pregnant?: No Vaginal bleeding?: No Last menstrual period?: n  Gastrointestinal Nausea?: No Vomiting?: No Indigestion/heartburn?: No Diarrhea?: No Constipation?: No  Constitutional Fever: No Night sweats?: No Weight loss?: No Fatigue?: No  Skin Skin rash/lesions?: No Itching?: No  Eyes Blurred vision?: No Double vision?: No  Ears/Nose/Throat Sore throat?: No Sinus problems?: No  Hematologic/Lymphatic Swollen glands?: No Easy bruising?: No  Cardiovascular Leg swelling?: No Chest pain?: No  Respiratory Cough?: No Shortness of breath?: No  Endocrine Excessive thirst?: No  Musculoskeletal Back pain?: No Joint pain?: No  Neurological Headaches?: No Dizziness?: No  Psychologic Depression?: No Anxiety?: No  Physical Exam: BP 138/86 mmHg  Pulse 67  Ht 5\' 8"  (1.727 m)  Wt 187 lb (84.823 kg)  BMI 28.44 kg/m2  Constitutional:  Alert and oriented, No acute distress. HEENT: [place] AT, moist mucus membranes.  Trachea midline, no masses. Cardiovascular: No clubbing, cyanosis, or edema. Respiratory: Normal respiratory effort, no increased work of breathing. GI: Abdomen is soft, nontender, nondistended, no abdominal masses GU: No CVA tenderness.  Skin: No rashes, bruises or suspicious lesions. Lymph: No cervical or inguinal adenopathy. Neurologic: Grossly intact, no focal deficits, moving all 4 extremities. Psychiatric: Normal mood and affect.  Laboratory Data: Lab Results  Component Value Date   WBC 10.6 10/18/2015    HGB 12.9 10/18/2015   HCT 40.0 10/18/2015   MCV 89.3 10/18/2015   PLT 350 10/18/2015    Lab Results  Component Value Date   CREATININE 1.22* 10/18/2015    No results found for: PSA  No results found for: TESTOSTERONE  No results found for: HGBA1C  Urinalysis    Component Value Date/Time   COLORURINE YELLOW 10/07/2015 1153   APPEARANCEUR Cloudy* 10/11/2015 [place] 10/07/2015 1153   LABSPEC 1.015 10/07/2015 1153   PHURINE 7.5 10/07/2015 1153   GLUCOSEU Negative 10/11/2015 1431   HGBUR 2+* 10/07/2015 1153   BILIRUBINUR Negative 10/11/2015 1431   BILIRUBINUR NEGATIVE 10/07/2015 1153   KETONESUR NEGATIVE 10/07/2015 1153   PROTEINUR 1+* 10/11/2015 1431   PROTEINUR 100* 10/07/2015 1153   UROBILINOGEN  0.2 06/22/2014 0905   NITRITE Negative 10/11/2015 1431   NITRITE NEGATIVE 10/07/2015 1153   LEUKOCYTESUR 1+* 10/11/2015 1431   LEUKOCYTESUR NEGATIVE 10/07/2015 1153    Pertinent Imaging: CLINICAL DATA: Left-sided hydronephrosis, followup  EXAM: RENAL / URINARY TRACT ULTRASOUND COMPLETE  COMPARISON: CT abdomen pelvis of 10/07/2015  FINDINGS: Right Kidney:  Length: The right kidney measures 11.2 cm. Small cysts are noted on the right with the larger in the lower pole of 1.7 cm.  Left Kidney:  Length: 12.5 cm. Very minimal fullness of the left pelvocaliceal system remains. No definite calculi are seen.  Bladder:  The urinary bladder is moderately well visualized. Bilateral ureteral jets are present  IMPRESSION: 1. No present evidence of obstruction with minimal fullness of the left pelvocaliceal system remaining. 2. No renal calculi are seen.  Assessment & Plan:    1. Left ureteral stone No evidence of residual stone burden or hydronephrosis status post ureteroscopy. Follow-up when necessary  Nickie Retort, [facility] [address] [place], [place] [address] [phone]

## 2015-12-22 DIAGNOSIS — E785 Hyperlipidemia, unspecified: Secondary | ICD-10-CM | POA: Diagnosis not present

## 2015-12-22 DIAGNOSIS — R7309 Other abnormal glucose: Secondary | ICD-10-CM | POA: Diagnosis not present

## 2015-12-29 DIAGNOSIS — N182 Chronic kidney disease, stage 2 (mild): Secondary | ICD-10-CM | POA: Diagnosis not present

## 2015-12-29 DIAGNOSIS — E210 Primary hyperparathyroidism: Secondary | ICD-10-CM | POA: Diagnosis not present

## 2015-12-29 DIAGNOSIS — I129 Hypertensive chronic kidney disease with stage 1 through stage 4 chronic kidney disease, or unspecified chronic kidney disease: Secondary | ICD-10-CM | POA: Diagnosis not present

## 2015-12-29 DIAGNOSIS — R7309 Other abnormal glucose: Secondary | ICD-10-CM | POA: Diagnosis not present

## 2016-03-30 DIAGNOSIS — I129 Hypertensive chronic kidney disease with stage 1 through stage 4 chronic kidney disease, or unspecified chronic kidney disease: Secondary | ICD-10-CM | POA: Diagnosis not present

## 2016-03-30 DIAGNOSIS — R7309 Other abnormal glucose: Secondary | ICD-10-CM | POA: Diagnosis not present

## 2016-03-30 DIAGNOSIS — E559 Vitamin D deficiency, unspecified: Secondary | ICD-10-CM | POA: Diagnosis not present

## 2016-03-30 DIAGNOSIS — E210 Primary hyperparathyroidism: Secondary | ICD-10-CM | POA: Diagnosis not present

## 2016-04-06 DIAGNOSIS — F3340 Major depressive disorder, recurrent, in remission, unspecified: Secondary | ICD-10-CM | POA: Diagnosis not present

## 2016-04-06 DIAGNOSIS — E210 Primary hyperparathyroidism: Secondary | ICD-10-CM | POA: Diagnosis not present

## 2016-04-06 DIAGNOSIS — E785 Hyperlipidemia, unspecified: Secondary | ICD-10-CM | POA: Diagnosis not present

## 2016-04-06 DIAGNOSIS — Z23 Encounter for immunization: Secondary | ICD-10-CM | POA: Diagnosis not present

## 2016-04-06 DIAGNOSIS — I129 Hypertensive chronic kidney disease with stage 1 through stage 4 chronic kidney disease, or unspecified chronic kidney disease: Secondary | ICD-10-CM | POA: Diagnosis not present

## 2016-04-07 ENCOUNTER — Other Ambulatory Visit: Payer: Self-pay

## 2016-06-21 DIAGNOSIS — K080 Exfoliation of teeth due to systemic causes: Secondary | ICD-10-CM | POA: Diagnosis not present

## 2016-07-20 DIAGNOSIS — H524 Presbyopia: Secondary | ICD-10-CM | POA: Diagnosis not present

## 2016-07-20 DIAGNOSIS — H25813 Combined forms of age-related cataract, bilateral: Secondary | ICD-10-CM | POA: Diagnosis not present

## 2016-07-20 DIAGNOSIS — H04123 Dry eye syndrome of bilateral lacrimal glands: Secondary | ICD-10-CM | POA: Diagnosis not present

## 2016-07-20 DIAGNOSIS — H0289 Other specified disorders of eyelid: Secondary | ICD-10-CM | POA: Diagnosis not present

## 2016-08-16 ENCOUNTER — Encounter (HOSPITAL_COMMUNITY): Payer: Self-pay | Admitting: *Deleted

## 2016-09-05 ENCOUNTER — Ambulatory Visit: Payer: Self-pay | Admitting: Orthopedic Surgery

## 2016-09-11 ENCOUNTER — Other Ambulatory Visit (HOSPITAL_COMMUNITY): Payer: Self-pay | Admitting: Anesthesiology

## 2016-09-11 NOTE — Patient Instructions (Signed)
Stefhanie L Poma  09/11/2016   Your procedure is scheduled on: Monday 09/18/2016  Report to [facility] Main  Entrance take [facility]  elevators to 3rd floor to  [place] at  1150 AM.  Call this number if you have problems the morning of surgery [phone]   Remember: ONLY 1 PERSON MAY GO WITH YOU TO SHORT STAY TO GET  READY MORNING OF [place].   Do not eat food  :After Midnight.  MAY HAVE CLEAR LIQUIDS FROM MIDNIGHT UP UNTIL 0850 AM THEN NOTHING UNTIL AFTER SURGERY!     CLEAR LIQUID DIET   Foods Allowed                                                                     Foods Excluded  Coffee and tea, regular and decaf                             liquids that you cannot  Plain Jell-O in any flavor                                             see through such as: Fruit ices (not with fruit pulp)                                     milk, soups, orange juice  Iced Popsicles                                    All solid food Carbonated beverages, regular and diet                                    Cranberry, grape and apple juices Sports drinks like Gatorade Lightly seasoned clear broth or consume(fat free) Sugar, honey syrup  Sample Menu Breakfast                                Lunch                                     Supper Cranberry juice                    Beef broth                            Chicken broth Jell-O                                     Grape juice  Apple juice Coffee or tea                        Jell-O                                      Popsicle                                                Coffee or tea                        Coffee or tea  _____________________________________________________________________    Take these medicines the morning of surgery with A SIP OF WATER: AMLODIPINE (NORVASC), USE FLONASE NASAL SPRAY IF NEEDED, USE PANTALOL EYE DROPS IF NEEDED                                   You may not have any metal on your body including hair pins and              piercings  Do not wear jewelry, make-up, lotions, powders or perfumes, deodorant             Do not wear nail polish.  Do not shave  48 hours prior to surgery.              Men may shave face and neck.   Do not bring valuables to the hospital. [place].  Contacts, dentures or bridgework may not be worn into surgery.  Leave suitcase in the car. After surgery it may be brought to your room.                  Please read over the following fact sheets you were given: _____________________________________________________________________             [facility] - Preparing for Surgery Before surgery, you can play an important role.  Because skin is not sterile, your skin needs to be as free of germs as possible.  You can reduce the number of germs on your skin by washing with CHG (chlorahexidine gluconate) soap before surgery.  CHG is an antiseptic cleaner which kills germs and bonds with the skin to continue killing germs even after washing. Please DO NOT use if you have an allergy to CHG or antibacterial soaps.  If your skin becomes reddened/irritated stop using the CHG and inform your nurse when you arrive at Short Stay. Do not shave (including legs and underarms) for at least 48 hours prior to the first CHG shower.  You may shave your face/neck. Please follow these instructions carefully:  1.  Shower with CHG Soap the night before surgery and the  morning of Surgery.  2.  If you choose to wash your hair, wash your hair first as usual with your  normal  shampoo.  3.  After you shampoo, rinse your hair and body thoroughly to remove the  shampoo.  4.  Use CHG as you would any other liquid soap.  You can apply chg directly  to the skin and wash                       Gently with a scrungie or clean washcloth.  5.  Apply the CHG Soap to your body  ONLY FROM THE NECK DOWN.   Do not use on face/ open                           Wound or open sores. Avoid contact with eyes, ears mouth and genitals (private parts).                       Wash face,  Genitals (private parts) with your normal soap.             6.  Wash thoroughly, paying special attention to the area where your surgery  will be performed.  7.  Thoroughly rinse your body with warm water from the neck down.  8.  DO NOT shower/wash with your normal soap after using and rinsing off  the CHG Soap.                9.  Pat yourself dry with a clean towel.            10.  Wear clean pajamas.            11.  Place clean sheets on your bed the night of your first shower and do not  sleep with pets. Day of Surgery : Do not apply any lotions/deodorants the morning of surgery.  Please wear clean clothes to the hospital/surgery center.  FAILURE TO FOLLOW THESE INSTRUCTIONS MAY RESULT IN THE CANCELLATION OF YOUR SURGERY PATIENT SIGNATURE_________________________________  NURSE SIGNATURE__________________________________  ________________________________________________________________________   Adam Phenix  An incentive spirometer is a tool that can help keep your lungs clear and active. This tool measures how well you are filling your lungs with each breath. Taking long deep breaths may help reverse or decrease the chance of developing breathing (pulmonary) problems (especially infection) following:  A long period of time when you are unable to move or be active. BEFORE THE PROCEDURE   If the spirometer includes an indicator to show your best effort, your nurse or respiratory therapist will set it to a desired goal.  If possible, sit up straight or lean slightly forward. Try not to slouch.  Hold the incentive spirometer in an upright position. INSTRUCTIONS FOR USE  1. Sit on the edge of your bed if possible, or sit up as far as you can in bed or on a chair. 2. Hold the  incentive spirometer in an upright position. 3. Breathe out normally. 4. Place the mouthpiece in your mouth and seal your lips tightly around it. 5. Breathe in slowly and as deeply as possible, raising the piston or the ball toward the top of the column. 6. Hold your breath for 3-5 seconds or for as long as possible. Allow the piston or ball to fall to the bottom of the column. 7. Remove the mouthpiece from your mouth and breathe out normally. 8. Rest for a few seconds and repeat Steps 1 through 7 at least 10 times every 1-2 hours when you are awake. Take your time and take a few normal breaths between deep breaths. 9. The spirometer may include an indicator to  show your best effort. Use the indicator as a goal to work toward during each repetition. 10. After each set of 10 deep breaths, practice coughing to be sure your lungs are clear. If you have an incision (the cut made at the time of surgery), support your incision when coughing by placing a pillow or rolled up towels firmly against it. Once you are able to get out of bed, walk around indoors and cough well. You may stop using the incentive spirometer when instructed by your caregiver.  RISKS AND COMPLICATIONS  Take your time so you do not get dizzy or light-headed.  If you are in pain, you may need to take or ask for pain medication before doing incentive spirometry. It is harder to take a deep breath if you are having pain. AFTER USE  Rest and breathe slowly and easily.  It can be helpful to keep track of a log of your progress. Your caregiver can provide you with a simple table to help with this. If you are using the spirometer at home, follow these instructions: [place] IF:   You are having difficultly using the spirometer.  You have trouble using the spirometer as often as instructed.  Your pain medication is not giving enough relief while using the spirometer.  You develop fever of 100.5 F (38.1 C) or  higher. SEEK IMMEDIATE MEDICAL CARE IF:   You cough up bloody sputum that had not been present before.  You develop fever of 102 F (38.9 C) or greater.  You develop worsening pain at or near the incision site. MAKE SURE YOU:   Understand these instructions.  Will watch your condition.  Will get help right away if you are not doing well or get worse. Document Released: 12/11/2006 Document Revised: 10/23/2011 Document Reviewed: 02/11/2007 ExitCare Patient Information 2014 ExitCare, [place].   ________________________________________________________________________  WHAT IS A BLOOD TRANSFUSION? Blood Transfusion Information  A transfusion is the replacement of blood or some of its parts. Blood is made up of multiple cells which provide different functions.  Red blood cells carry oxygen and are used for blood loss replacement.  White blood cells fight against infection.  Platelets control bleeding.  Plasma helps clot blood.  Other blood products are available for specialized needs, such as hemophilia or other clotting disorders. BEFORE THE TRANSFUSION  Who gives blood for transfusions?   Healthy volunteers who are fully evaluated to make sure their blood is safe. This is blood bank blood. Transfusion therapy is the safest it has ever been in the practice of medicine. Before blood is taken from a donor, a complete history is taken to make sure that person has no history of diseases nor engages in risky social behavior (examples are intravenous drug use or sexual activity with multiple partners). The donor's travel history is screened to minimize risk of transmitting infections, such as malaria. The donated blood is tested for signs of infectious diseases, such as HIV and hepatitis. The blood is then tested to be sure it is compatible with you in order to minimize the chance of a transfusion reaction. If you or a relative donates blood, this is often done in anticipation of surgery  and is not appropriate for emergency situations. It takes many days to process the donated blood. RISKS AND COMPLICATIONS Although transfusion therapy is very safe and saves many lives, the main dangers of transfusion include:   Getting an infectious disease.  Developing a transfusion reaction. This is an allergic reaction  to something in the blood you were given. Every precaution is taken to prevent this. The decision to have a blood transfusion has been considered carefully by your caregiver before blood is given. Blood is not given unless the benefits outweigh the risks. AFTER THE TRANSFUSION  Right after receiving a blood transfusion, you will usually feel much better and more energetic. This is especially true if your red blood cells have gotten low (anemic). The transfusion raises the level of the red blood cells which carry oxygen, and this usually causes an energy increase.  The nurse administering the transfusion will monitor you carefully for complications. HOME CARE INSTRUCTIONS  No special instructions are needed after a transfusion. You may find your energy is better. Speak with your caregiver about any limitations on activity for underlying diseases you may have. SEEK MEDICAL CARE IF:   Your condition is not improving after your transfusion.  You develop redness or irritation at the intravenous (IV) site. SEEK IMMEDIATE MEDICAL CARE IF:  Any of the following symptoms occur over the next 12 hours:  Shaking chills.  You have a temperature by mouth above 102 F (38.9 C), not controlled by medicine.  Chest, back, or muscle pain.  People around you feel you are not acting correctly or are confused.  Shortness of breath or difficulty breathing.  Dizziness and fainting.  You get a rash or develop hives.  You have a decrease in urine output.  Your urine turns a dark color or changes to pink, red, or brown. Any of the following symptoms occur over the next 10  days:  You have a temperature by mouth above 102 F (38.9 C), not controlled by medicine.  Shortness of breath.  Weakness after normal activity.  The white part of the eye turns yellow (jaundice).  You have a decrease in the amount of urine or are urinating less often.  Your urine turns a dark color or changes to pink, red, or brown. Document Released: 07/28/2000 Document Revised: 10/23/2011 Document Reviewed: 03/16/2008 [facility] Patient Information 2014 [place], [place].  _______________________________________________________________________

## 2016-09-13 ENCOUNTER — Encounter (HOSPITAL_COMMUNITY): Payer: Self-pay

## 2016-09-13 ENCOUNTER — Other Ambulatory Visit: Payer: Self-pay

## 2016-09-13 ENCOUNTER — Encounter (HOSPITAL_COMMUNITY)
Admission: RE | Admit: 2016-09-13 | Discharge: 2016-09-13 | Disposition: A | Discharge status: Home / Self Care | Payer: Federal, State, Local not specified - PPO | Source: Ambulatory Visit | Attending: Orthopedic Surgery | Admitting: Orthopedic Surgery

## 2016-09-13 DIAGNOSIS — M1711 Unilateral primary osteoarthritis, right knee: Secondary | ICD-10-CM | POA: Diagnosis not present

## 2016-09-13 DIAGNOSIS — Z01818 Encounter for other preprocedural examination: Secondary | ICD-10-CM | POA: Diagnosis not present

## 2016-09-13 DIAGNOSIS — I10 Essential (primary) hypertension: Secondary | ICD-10-CM | POA: Diagnosis not present

## 2016-09-13 DIAGNOSIS — R001 Bradycardia, unspecified: Secondary | ICD-10-CM | POA: Diagnosis not present

## 2016-09-13 HISTORY — DX: Major depressive disorder, single episode, unspecified: F32.9

## 2016-09-13 HISTORY — DX: Depression, unspecified: F32.A

## 2016-09-13 HISTORY — DX: Personal history of urinary calculi: Z87.442

## 2016-09-13 HISTORY — DX: Anxiety disorder, unspecified: F41.9

## 2016-09-13 LAB — COMPREHENSIVE METABOLIC PANEL
ALBUMIN: 4.6 g/dL (ref 3.5–5.0)
ALT: 17 U/L (ref 14–54)
ANION GAP: 7 (ref 5–15)
AST: 19 U/L (ref 15–41)
Alkaline Phosphatase: 101 U/L (ref 38–126)
BUN: 13 mg/dL (ref 6–20)
CO2: 27 mmol/L (ref 22–32)
Calcium: 10.7 mg/dL — ABNORMAL HIGH (ref 8.9–10.3)
Chloride: 106 mmol/L (ref 101–111)
Creatinine, Ser: 0.86 mg/dL (ref 0.44–1.00)
GFR calc Af Amer: >60 mL/min (ref >60)
GFR calc non Af Amer: >60 mL/min (ref >60)
Glucose, Bld: 85 mg/dL (ref 65–99)
POTASSIUM: 4.4 mmol/L (ref 3.5–5.1)
SODIUM: 140 mmol/L (ref 135–145)
Total Bilirubin: 0.5 mg/dL (ref 0.3–1.2)
Total Protein: 7.5 g/dL (ref 6.5–8.1)

## 2016-09-13 LAB — CBC
HCT: 43.4 % (ref 36.0–46.0)
HEMOGLOBIN: 13.9 g/dL (ref 12.0–15.0)
MCH: 29 pg (ref 26.0–34.0)
MCHC: 32 g/dL (ref 30.0–36.0)
MCV: 90.4 fL (ref 78.0–100.0)
Platelets: 297 10*3/uL (ref 150–400)
RBC: 4.8 MIL/uL (ref 3.87–5.11)
RDW: 13.7 % (ref 11.5–15.5)
WBC: 8.8 10*3/uL (ref 4.0–10.5)

## 2016-09-13 LAB — SURGICAL PCR SCREEN
MRSA, PCR: NEGATIVE
Staphylococcus aureus: POSITIVE — AB

## 2016-09-13 LAB — APTT: APTT: 30 s (ref 24–36)

## 2016-09-13 LAB — PROTIME-INR
INR: 0.98
Prothrombin Time: 13 seconds (ref 11.4–15.2)

## 2016-09-17 ENCOUNTER — Ambulatory Visit: Payer: Self-pay | Admitting: Orthopedic Surgery

## 2016-09-17 NOTE — H&P (Signed)
Alexandra Gibson DOB: May 13, 1951 Married / Language: English / Race: American [place] or [place] Native Female Date of Admission:  09/18/2016 CC:  Right Knee Pain History of Present Illness The patient is a 66 year old female who comes in for a preoperative History and Physical. The patient is scheduled for a right total knee arthroplasty to be performed by Dr. Dione Plover. Aluisio, MD at [facility] on 09-18-2016. The patient is a 66 year old female who presented for follow up of their hip adn their knee. The patient is being followed for their right hip pain and osteoarthritis. They are month(s) out from when symptoms began. The patient feels that they are doing poorly and report their pain level to be moderate. The following medication has been used for pain control: none. Seh is also several nonths out from the left total hip arthroplasty. The patient states that she is doing very well at this time. The pain is under excellent control at this time and describes their pain as mild (tenderness). They are currently on no medication for their pain. The patient is currently doing home exercise program. The patient feels that they are progressing well at this time. Her main issue at this time is the right knee. The patient is being followed for their right knee pain and osteoarthritis. They are now months out from cortisone injection. Symptoms reported include: pain at night and aching. The patient feels that they are doing well and report their pain level to be mild. The following medication has been used for pain control: none. The patient has reported temporary improvement of their symptoms with: Cortisone injections. It is felt that she would benefit from undergoing total knee procedure. She would like to proceed at this time with the right knee surgery. They have been treated conservatively in the past for the above stated problem and despite conservative measures, they continue to have progressive  pain and severe functional limitations and dysfunction. They have failed non-operative management including home exercise, medications, and injections. It is felt that they would benefit from undergoing total joint replacement. Risks and benefits of the procedure have been discussed with the patient and they elect to proceed with surgery. There are no active contraindications to surgery such as ongoing infection or rapidly progressive neurological disease.  Problem List/Past Medical Status post total left knee replacement [id number])  Primary osteoarthritis of left hip (M16.12)  Fibromyalgia  High blood pressure  Hypercholesterolemia  Osteoporosis  Gastroesophageal Reflux Disease  Primary osteoarthritis of left knee (M17.12)  Allergies Crestor *ANTIHYPERLIPIDEMICS*   Family History Osteoarthritis  Mother. mother Hypertension  mother Congestive Heart Failure  father  Social History Tobacco use  Never smoker. never smoker Alcohol use  never consumed alcohol Living situation  live alone Illicit drug use  no Exercise  Exercises rarely; does running / walking Pain Contract  no Number of flights of stairs before winded  less than 1 Marital status  divorced Drug/Alcohol Rehab (Previously)  no No alcohol use  Drug/Alcohol Rehab (Currently)  no Current work status  disabled Children  2  Medication History Magnesium (500MG  Tablet, Oral) Active. Omega 3 (1000MG  Capsule, Oral) Active. Vitamin B-12 (Oral) Specific strength unknown - Active. TraMADol HCl (50MG  Tablet, Oral) Active. Escitalopram Oxalate (5MG /5ML Solution, Oral) Active. AmLODIPine Besylate (5MG  Tablet, Oral) Active. Irbesartan (300MG  Tablet, Oral) Active. Pravastatin Sodium (40MG  Tablet, Oral) Active.  Past Surgical History  Appendectomy  Total Hip Replacement - Left [08/04/2015]: Arthroscopy of Knee  left Total Knee  Replacement - Left  Date: 06/2014.    Review of Systems   General Not Present- Chills, Fatigue, Fever, Memory Loss, Night Sweats, Weight Gain and Weight Loss. Skin Not Present- Eczema, Hives, Itching, Lesions and Rash. HEENT Not Present- Dentures, Double Vision, Headache, Hearing Loss, Tinnitus and Visual Loss. Respiratory Not Present- Allergies, Chronic Cough, Coughing up blood, Shortness of breath at rest and Shortness of breath with exertion. Cardiovascular Not Present- Chest Pain, Difficulty Breathing Lying Down, Murmur, Palpitations, Racing/skipping heartbeats and Swelling. Gastrointestinal Not Present- Abdominal Pain, Bloody Stool, Constipation, Diarrhea, Difficulty Swallowing, Heartburn, Jaundice, Loss of appetitie, Nausea and Vomiting. Female Genitourinary Not Present- Blood in Urine, Discharge, Flank Pain, Incontinence, Painful Urination, Urgency, Urinary frequency, Urinary Retention, Urinating at Night and Weak urinary stream. Musculoskeletal Present- Back Pain, Joint Pain, Morning Stiffness and Muscle Weakness. Not Present- Joint Swelling, Muscle Pain and Spasms. Neurological Not Present- Blackout spells, Difficulty with balance, Dizziness, Paralysis, Tremor and Weakness. Psychiatric Not Present- Insomnia.  Vitals  Weight: 190 lb Height: 67.5in Body Surface Area: 1.99 m Body Mass Index: 29.32 kg/m  Pulse: 68 (Regular)  BP: 128/78 (Sitting, Right Arm, Standard)  Physical Exam General Mental Status -Alert, cooperative and good historian. General Appearance-pleasant, Not in acute distress. Orientation-Oriented X3. Build & Nutrition-Well nourished and Well developed.  Head and Neck Head-normocephalic, atraumatic . Neck Global Assessment - supple, no bruit auscultated on the right, no bruit auscultated on the left.  Eye Pupil - Bilateral-Regular and Round. Motion - Bilateral-EOMI.  Chest and Lung Exam Auscultation Breath sounds - clear at anterior chest wall and clear at posterior chest wall. Adventitious  sounds - No Adventitious sounds.  Cardiovascular Auscultation Rhythm - Regular rate and rhythm. Heart Sounds - S1 WNL and S2 WNL. Murmurs & Other Heart Sounds - Auscultation of the heart reveals - No Murmurs.  Abdomen Palpation/Percussion Tenderness - Abdomen is non-tender to palpation. Rigidity (guarding) - Abdomen is soft. Auscultation Auscultation of the abdomen reveals - Bowel sounds normal.  Female Genitourinary Note: Not done, not pertinent to present illness   Musculoskeletal Note: She is alert and oriented. She is in no acute distress. Left hip, flexion to 110 degrees, internal rotation 20, external rotation 20, abduction 20. Incisions healed, no signs of infection. No pain with passive or active range of motion of the left hip. The right hip, she has slight discomfort with internal rotation. Otherwise, not able to reproduce discomfort on exam. The right knee, she is tender along the lateral joint line. Moderate patellofemoral crepitus. Does have a slight valgus deformity noted. No effusion or instability noted. Distal pulses 2+. Sensation and motor function intact in lower extremities.  IMAGING AP and lateral views of the left hip show the prosthesis in excellent position, no periprosthetic abnormalities. The right hip, there is obviously some joint space narrowing, does not appear to be quite bone on bone just yet. There is some mild cystic formation as well as osteophyte formation. Did review x-rays of her right knee, which shows that she is completely bone on bone in the lateral compartment causing that valgus deformity.  Assessment & Plan Status post total left knee replacement [id number]) Aftercare following left hip joint replacement surgery (Z47.1, [id number]) Primary osteoarthritis of right hip (M16.11) Primary osteoarthritis of right knee (M17.11)  Note:Surgical Plans: Right Total Knee Replacement  Disposition: Home versus SNF  PCP: Dr. Rowe Clack  IV  TXA  Anesthesia Issues: None  Patient was instructed on what medications to stop prior to surgery.  Signed electronically  by Ok Edwards, III PA-C

## 2016-09-18 ENCOUNTER — Inpatient Hospital Stay (HOSPITAL_COMMUNITY): Payer: Federal, State, Local not specified - PPO | Admitting: Certified Registered"

## 2016-09-18 ENCOUNTER — Encounter (HOSPITAL_COMMUNITY): Payer: Self-pay | Admitting: *Deleted

## 2016-09-18 ENCOUNTER — Encounter (HOSPITAL_COMMUNITY)
Admission: RE | Disposition: A | Discharge status: Home / Self Care | Payer: Self-pay | Source: Ambulatory Visit | Attending: Orthopedic Surgery

## 2016-09-18 ENCOUNTER — Inpatient Hospital Stay (HOSPITAL_COMMUNITY)
Admission: RE | Admit: 2016-09-18 | Discharge: 2016-09-20 | DRG: 470 | Disposition: A | Discharge status: Home / Self Care | Payer: Federal, State, Local not specified - PPO | Source: Ambulatory Visit | Attending: Orthopedic Surgery | Admitting: Orthopedic Surgery

## 2016-09-18 DIAGNOSIS — Z87442 Personal history of urinary calculi: Secondary | ICD-10-CM | POA: Diagnosis not present

## 2016-09-18 DIAGNOSIS — F329 Major depressive disorder, single episode, unspecified: Secondary | ICD-10-CM | POA: Diagnosis present

## 2016-09-18 DIAGNOSIS — M797 Fibromyalgia: Secondary | ICD-10-CM | POA: Diagnosis present

## 2016-09-18 DIAGNOSIS — Z96652 Presence of left artificial knee joint: Secondary | ICD-10-CM | POA: Diagnosis present

## 2016-09-18 DIAGNOSIS — M1711 Unilateral primary osteoarthritis, right knee: Principal | ICD-10-CM | POA: Diagnosis present

## 2016-09-18 DIAGNOSIS — Z79899 Other long term (current) drug therapy: Secondary | ICD-10-CM | POA: Diagnosis not present

## 2016-09-18 DIAGNOSIS — F419 Anxiety disorder, unspecified: Secondary | ICD-10-CM | POA: Diagnosis present

## 2016-09-18 DIAGNOSIS — M25561 Pain in right knee: Secondary | ICD-10-CM | POA: Diagnosis not present

## 2016-09-18 DIAGNOSIS — M179 Osteoarthritis of knee, unspecified: Secondary | ICD-10-CM | POA: Diagnosis present

## 2016-09-18 DIAGNOSIS — M21061 Valgus deformity, not elsewhere classified, right knee: Secondary | ICD-10-CM | POA: Diagnosis present

## 2016-09-18 DIAGNOSIS — I10 Essential (primary) hypertension: Secondary | ICD-10-CM | POA: Diagnosis present

## 2016-09-18 DIAGNOSIS — M1710 Unilateral primary osteoarthritis, unspecified knee: Secondary | ICD-10-CM | POA: Diagnosis present

## 2016-09-18 DIAGNOSIS — K219 Gastro-esophageal reflux disease without esophagitis: Secondary | ICD-10-CM | POA: Diagnosis present

## 2016-09-18 DIAGNOSIS — T888 Other specified complications of surgical and medical care, not elsewhere classified: Secondary | ICD-10-CM | POA: Diagnosis not present

## 2016-09-18 DIAGNOSIS — E785 Hyperlipidemia, unspecified: Secondary | ICD-10-CM | POA: Diagnosis present

## 2016-09-18 DIAGNOSIS — Z96642 Presence of left artificial hip joint: Secondary | ICD-10-CM | POA: Diagnosis present

## 2016-09-18 DIAGNOSIS — N201 Calculus of ureter: Secondary | ICD-10-CM | POA: Diagnosis not present

## 2016-09-18 DIAGNOSIS — G8918 Other acute postprocedural pain: Secondary | ICD-10-CM | POA: Diagnosis not present

## 2016-09-18 HISTORY — PX: TOTAL KNEE ARTHROPLASTY: SHX125

## 2016-09-18 LAB — TYPE AND SCREEN
ABO/RH(D): O POS
ANTIBODY SCREEN: NEGATIVE

## 2016-09-18 SURGERY — ARTHROPLASTY, KNEE, TOTAL
Anesthesia: Spinal | Site: Knee | Laterality: Right

## 2016-09-18 MED ORDER — PROPOFOL 500 MG/50ML IV EMUL
INTRAVENOUS | Status: DC | PRN
  Administered 2016-09-18: 75 ug/kg/min via INTRAVENOUS

## 2016-09-18 MED ORDER — RIVAROXABAN 10 MG PO TABS
10.0000 mg | ORAL_TABLET | Freq: Every day | ORAL | Status: DC
  Administered 2016-09-19 – 2016-09-20 (×2): 10 mg via ORAL
  Filled 2016-09-18 (×2): qty 1

## 2016-09-18 MED ORDER — DIPHENHYDRAMINE HCL 12.5 MG/5ML PO ELIX: 12.5000 mg | ORAL_SOLUTION | ORAL | Status: DC | PRN

## 2016-09-18 MED ORDER — PRAVASTATIN SODIUM 20 MG PO TABS
40.0000 mg | ORAL_TABLET | Freq: Every evening | ORAL | Status: DC
  Administered 2016-09-18 – 2016-09-19 (×2): 40 mg via ORAL
  Filled 2016-09-18 (×2): qty 2

## 2016-09-18 MED ORDER — ROPIVACAINE HCL 5 MG/ML IJ SOLN
INTRAMUSCULAR | Status: DC | PRN
  Administered 2016-09-18: 20 mL via PERINEURAL

## 2016-09-18 MED ORDER — ONDANSETRON HCL 4 MG/2ML IJ SOLN
INTRAMUSCULAR | Status: DC | PRN
  Administered 2016-09-18: 4 mg via INTRAVENOUS

## 2016-09-18 MED ORDER — ESCITALOPRAM OXALATE 10 MG PO TABS
5.0000 mg | ORAL_TABLET | Freq: Every day | ORAL | Status: DC
  Administered 2016-09-18 – 2016-09-19 (×2): 5 mg via ORAL
  Filled 2016-09-18 (×2): qty 1

## 2016-09-18 MED ORDER — ACETAMINOPHEN 325 MG PO TABS: 650.0000 mg | ORAL_TABLET | Freq: Four times a day (QID) | ORAL | Status: DC | PRN

## 2016-09-18 MED ORDER — TRANEXAMIC ACID 1000 MG/10ML IV SOLN
1000.0000 mg | INTRAVENOUS | Status: AC
  Administered 2016-09-18: 1000 mg via INTRAVENOUS
  Filled 2016-09-18: qty 10

## 2016-09-18 MED ORDER — MIDAZOLAM HCL 5 MG/5ML IJ SOLN
INTRAMUSCULAR | Status: DC | PRN
  Administered 2016-09-18 (×2): 1 mg via INTRAVENOUS

## 2016-09-18 MED ORDER — METHOCARBAMOL 500 MG PO TABS
500.0000 mg | ORAL_TABLET | Freq: Four times a day (QID) | ORAL | Status: DC | PRN
  Administered 2016-09-18 – 2016-09-20 (×4): 500 mg via ORAL
  Filled 2016-09-18 (×4): qty 1

## 2016-09-18 MED ORDER — OLOPATADINE HCL 0.1 % OP SOLN
1.0000 [drp] | Freq: Every day | OPHTHALMIC | Status: DC | PRN
  Filled 2016-09-18: qty 5

## 2016-09-18 MED ORDER — 0.9 % SODIUM CHLORIDE (POUR BTL) OPTIME
TOPICAL | Status: DC | PRN
  Administered 2016-09-18: 1000 mL

## 2016-09-18 MED ORDER — CHLORHEXIDINE GLUCONATE 4 % EX LIQD: 60.0000 mL | Freq: Once | CUTANEOUS | Status: DC

## 2016-09-18 MED ORDER — PROPOFOL 10 MG/ML IV BOLUS
INTRAVENOUS | Status: AC
  Filled 2016-09-18: qty 20

## 2016-09-18 MED ORDER — OXYCODONE HCL 5 MG PO TABS
5.0000 mg | ORAL_TABLET | ORAL | Status: DC | PRN
  Administered 2016-09-18: 10 mg via ORAL
  Administered 2016-09-18: 5 mg via ORAL
  Administered 2016-09-19 – 2016-09-20 (×11): 10 mg via ORAL
  Filled 2016-09-18 (×7): qty 2
  Filled 2016-09-18: qty 1
  Filled 2016-09-18 (×5): qty 2

## 2016-09-18 MED ORDER — PROPOFOL 10 MG/ML IV BOLUS
INTRAVENOUS | Status: DC | PRN
  Administered 2016-09-18 (×4): 20 mg via INTRAVENOUS

## 2016-09-18 MED ORDER — BUPIVACAINE LIPOSOME 1.3 % IJ SUSP
20.0000 mL | Freq: Once | INTRAMUSCULAR | Status: DC
  Filled 2016-09-18: qty 20

## 2016-09-18 MED ORDER — FENTANYL CITRATE (PF) 100 MCG/2ML IJ SOLN
INTRAMUSCULAR | Status: DC | PRN
  Administered 2016-09-18 (×2): 50 ug via INTRAVENOUS

## 2016-09-18 MED ORDER — LACTATED RINGERS IV SOLN: INTRAVENOUS | Status: DC

## 2016-09-18 MED ORDER — ONDANSETRON HCL 4 MG/2ML IJ SOLN
INTRAMUSCULAR | Status: AC
  Filled 2016-09-18: qty 2

## 2016-09-18 MED ORDER — TRANEXAMIC ACID 1000 MG/10ML IV SOLN
1000.0000 mg | Freq: Once | INTRAVENOUS | Status: AC
  Administered 2016-09-18: 1000 mg via INTRAVENOUS
  Filled 2016-09-18: qty 10

## 2016-09-18 MED ORDER — BUPIVACAINE LIPOSOME 1.3 % IJ SUSP
INTRAMUSCULAR | Status: DC | PRN
  Administered 2016-09-18: 20 mL

## 2016-09-18 MED ORDER — DOCUSATE SODIUM 100 MG PO CAPS
100.0000 mg | ORAL_CAPSULE | Freq: Two times a day (BID) | ORAL | Status: DC
  Administered 2016-09-18 – 2016-09-20 (×4): 100 mg via ORAL
  Filled 2016-09-18 (×4): qty 1

## 2016-09-18 MED ORDER — CEFAZOLIN SODIUM-DEXTROSE 2-4 GM/100ML-% IV SOLN
INTRAVENOUS | Status: AC
  Filled 2016-09-18: qty 100

## 2016-09-18 MED ORDER — FLUTICASONE PROPIONATE 50 MCG/ACT NA SUSP
1.0000 | Freq: Every day | NASAL | Status: DC | PRN
  Filled 2016-09-18: qty 16

## 2016-09-18 MED ORDER — SODIUM CHLORIDE 0.9 % IV SOLN
INTRAVENOUS | Status: DC
  Administered 2016-09-18: 19:00:00 via INTRAVENOUS

## 2016-09-18 MED ORDER — METOCLOPRAMIDE HCL 5 MG/ML IJ SOLN: 5.0000 mg | Freq: Three times a day (TID) | INTRAMUSCULAR | Status: DC | PRN

## 2016-09-18 MED ORDER — MORPHINE SULFATE (PF) 2 MG/ML IV SOLN
1.0000 mg | INTRAVENOUS | Status: DC | PRN
  Administered 2016-09-18 – 2016-09-19 (×3): 1 mg via INTRAVENOUS
  Filled 2016-09-18 (×3): qty 1

## 2016-09-18 MED ORDER — PHENOL 1.4 % MT LIQD: 1.0000 | OROMUCOSAL | Status: DC | PRN

## 2016-09-18 MED ORDER — STERILE WATER FOR IRRIGATION IR SOLN
Status: DC | PRN
  Administered 2016-09-18: 2000 mL

## 2016-09-18 MED ORDER — ONDANSETRON HCL 4 MG/2ML IJ SOLN: 4.0000 mg | Freq: Four times a day (QID) | INTRAMUSCULAR | Status: DC | PRN

## 2016-09-18 MED ORDER — EPHEDRINE SULFATE 50 MG/ML IJ SOLN
INTRAMUSCULAR | Status: DC | PRN
  Administered 2016-09-18 (×3): 5 mg via INTRAVENOUS

## 2016-09-18 MED ORDER — DEXAMETHASONE SODIUM PHOSPHATE 10 MG/ML IJ SOLN
10.0000 mg | Freq: Once | INTRAMUSCULAR | Status: AC
  Administered 2016-09-19: 10 mg via INTRAVENOUS
  Filled 2016-09-18: qty 1

## 2016-09-18 MED ORDER — ACETAMINOPHEN 500 MG PO TABS
1000.0000 mg | ORAL_TABLET | Freq: Four times a day (QID) | ORAL | Status: AC
  Administered 2016-09-18 – 2016-09-19 (×4): 1000 mg via ORAL
  Filled 2016-09-18 (×4): qty 2

## 2016-09-18 MED ORDER — ACETAMINOPHEN 650 MG RE SUPP: 650.0000 mg | Freq: Four times a day (QID) | RECTAL | Status: DC | PRN

## 2016-09-18 MED ORDER — DEXAMETHASONE SODIUM PHOSPHATE 10 MG/ML IJ SOLN
10.0000 mg | Freq: Once | INTRAMUSCULAR | Status: AC
  Administered 2016-09-18: 10 mg via INTRAVENOUS

## 2016-09-18 MED ORDER — CEFAZOLIN SODIUM-DEXTROSE 2-4 GM/100ML-% IV SOLN
2.0000 g | Freq: Four times a day (QID) | INTRAVENOUS | Status: AC
  Administered 2016-09-18 – 2016-09-19 (×2): 2 g via INTRAVENOUS
  Filled 2016-09-18 (×2): qty 100

## 2016-09-18 MED ORDER — BISACODYL 10 MG RE SUPP
10.0000 mg | Freq: Every day | RECTAL | Status: DC | PRN
Start: 2016-09-18 — End: 2016-09-20

## 2016-09-18 MED ORDER — AMLODIPINE BESYLATE 5 MG PO TABS
5.0000 mg | ORAL_TABLET | Freq: Every day | ORAL | Status: DC
  Administered 2016-09-19 – 2016-09-20 (×2): 5 mg via ORAL
  Filled 2016-09-18 (×2): qty 1

## 2016-09-18 MED ORDER — FLEET ENEMA 7-19 GM/118ML RE ENEM: 1.0000 | ENEMA | Freq: Once | RECTAL | Status: DC | PRN

## 2016-09-18 MED ORDER — MIDAZOLAM HCL 2 MG/2ML IJ SOLN
INTRAMUSCULAR | Status: AC
  Filled 2016-09-18: qty 2

## 2016-09-18 MED ORDER — BUPIVACAINE IN DEXTROSE 0.75-8.25 % IT SOLN
INTRATHECAL | Status: DC | PRN
  Administered 2016-09-18: 1.8 mL via INTRATHECAL

## 2016-09-18 MED ORDER — OXYCODONE HCL 5 MG PO TABS: 5.0000 mg | ORAL_TABLET | Freq: Once | ORAL | Status: DC | PRN

## 2016-09-18 MED ORDER — METHOCARBAMOL 1000 MG/10ML IJ SOLN
500.0000 mg | Freq: Four times a day (QID) | INTRAMUSCULAR | Status: DC | PRN
  Administered 2016-09-18: 500 mg via INTRAVENOUS
  Filled 2016-09-18: qty 550
  Filled 2016-09-18: qty 5

## 2016-09-18 MED ORDER — ROPIVACAINE HCL 5 MG/ML IJ SOLN
INTRAMUSCULAR | Status: AC
  Filled 2016-09-18: qty 30

## 2016-09-18 MED ORDER — POLYETHYLENE GLYCOL 3350 17 G PO PACK: 17.0000 g | PACK | Freq: Every day | ORAL | Status: DC | PRN

## 2016-09-18 MED ORDER — EPHEDRINE 5 MG/ML INJ
INTRAVENOUS | Status: AC
  Filled 2016-09-18: qty 10

## 2016-09-18 MED ORDER — LACTATED RINGERS IV SOLN
INTRAVENOUS | Status: DC | PRN
  Administered 2016-09-18 (×2): via INTRAVENOUS

## 2016-09-18 MED ORDER — IRBESARTAN 150 MG PO TABS
300.0000 mg | ORAL_TABLET | Freq: Every morning | ORAL | Status: DC
  Administered 2016-09-19 – 2016-09-20 (×2): 300 mg via ORAL
  Filled 2016-09-18 (×2): qty 2

## 2016-09-18 MED ORDER — OXYCODONE HCL 5 MG/5ML PO SOLN
5.0000 mg | Freq: Once | ORAL | Status: DC | PRN
  Filled 2016-09-18: qty 5

## 2016-09-18 MED ORDER — SODIUM CHLORIDE 0.9 % IJ SOLN
INTRAMUSCULAR | Status: DC | PRN
  Administered 2016-09-18: 30 mL

## 2016-09-18 MED ORDER — METOCLOPRAMIDE HCL 5 MG PO TABS: 5.0000 mg | ORAL_TABLET | Freq: Three times a day (TID) | ORAL | Status: DC | PRN

## 2016-09-18 MED ORDER — ACETAMINOPHEN 10 MG/ML IV SOLN
INTRAVENOUS | Status: AC
  Filled 2016-09-18: qty 100

## 2016-09-18 MED ORDER — SODIUM CHLORIDE 0.9 % IJ SOLN
INTRAMUSCULAR | Status: AC
  Filled 2016-09-18: qty 50

## 2016-09-18 MED ORDER — ACETAMINOPHEN 10 MG/ML IV SOLN
1000.0000 mg | Freq: Once | INTRAVENOUS | Status: AC
  Administered 2016-09-18: 1000 mg via INTRAVENOUS

## 2016-09-18 MED ORDER — MENTHOL 3 MG MT LOZG: 1.0000 | LOZENGE | OROMUCOSAL | Status: DC | PRN

## 2016-09-18 MED ORDER — ONDANSETRON HCL 4 MG PO TABS: 4.0000 mg | ORAL_TABLET | Freq: Four times a day (QID) | ORAL | Status: DC | PRN

## 2016-09-18 MED ORDER — DEXAMETHASONE SODIUM PHOSPHATE 10 MG/ML IJ SOLN
INTRAMUSCULAR | Status: AC
  Filled 2016-09-18: qty 1

## 2016-09-18 MED ORDER — PROPOFOL 10 MG/ML IV BOLUS
INTRAVENOUS | Status: AC
  Filled 2016-09-18: qty 60

## 2016-09-18 MED ORDER — CEFAZOLIN SODIUM-DEXTROSE 2-4 GM/100ML-% IV SOLN
2.0000 g | INTRAVENOUS | Status: AC
  Administered 2016-09-18: 2 g via INTRAVENOUS

## 2016-09-18 MED ORDER — FENTANYL CITRATE (PF) 100 MCG/2ML IJ SOLN: 25.0000 ug | INTRAMUSCULAR | Status: DC | PRN

## 2016-09-18 MED ORDER — FENTANYL CITRATE (PF) 100 MCG/2ML IJ SOLN
INTRAMUSCULAR | Status: AC
  Filled 2016-09-18: qty 2

## 2016-09-18 SURGICAL SUPPLY — 46 items
BAG SPEC THK2 15X12 ZIP CLS (MISCELLANEOUS) ×1
BAG ZIPLOCK 12X15 (MISCELLANEOUS) ×2 IMPLANT
BANDAGE ACE 6X5 VEL STRL LF (GAUZE/BANDAGES/DRESSINGS) ×2 IMPLANT
BLADE SAG 18X100X1.27 (BLADE) ×2 IMPLANT
BLADE SAW SGTL 11.0X1.19X90.0M (BLADE) ×2 IMPLANT
BOWL SMART MIX CTS (DISPOSABLE) ×2 IMPLANT
CAPT KNEE TOTAL 3 ATTUNE ×1 IMPLANT
CEMENT HV SMART SET (Cement) ×4 IMPLANT
CLOTH BEACON ORANGE TIMEOUT ST (SAFETY) ×2 IMPLANT
CUFF TOURN SGL QUICK 34 (TOURNIQUET CUFF) ×2
CUFF TRNQT CYL 34X4X40X1 (TOURNIQUET CUFF) ×1 IMPLANT
DECANTER SPIKE VIAL GLASS SM (MISCELLANEOUS) ×2 IMPLANT
DRAPE U-SHAPE 47X51 STRL (DRAPES) ×2 IMPLANT
DRSG ADAPTIC 3X8 NADH LF (GAUZE/BANDAGES/DRESSINGS) ×2 IMPLANT
DURAPREP 26ML APPLICATOR (WOUND CARE) ×2 IMPLANT
ELECT REM PT RETURN 9FT ADLT (ELECTROSURGICAL) ×2
ELECTRODE REM PT RTRN 9FT ADLT (ELECTROSURGICAL) ×1 IMPLANT
EVACUATOR 1/8 PVC DRAIN (DRAIN) ×2 IMPLANT
GAUZE SPONGE 4X4 12PLY STRL (GAUZE/BANDAGES/DRESSINGS) ×2 IMPLANT
GLOVE BIO SURGEON STRL SZ7.5 (GLOVE) ×2 IMPLANT
GLOVE BIO SURGEON STRL SZ8 (GLOVE) ×2 IMPLANT
GLOVE BIOGEL PI IND STRL 7.5 (GLOVE) IMPLANT
GLOVE BIOGEL PI IND STRL 8 (GLOVE) ×1 IMPLANT
GLOVE BIOGEL PI INDICATOR 7.5 (GLOVE) ×4
GLOVE BIOGEL PI INDICATOR 8 (GLOVE) ×2
GLOVE SURG SS PI 7.0 STRL IVOR (GLOVE) ×1 IMPLANT
GOWN STRL REUS W/TWL LRG LVL3 (GOWN DISPOSABLE) ×3 IMPLANT
GOWN STRL REUS W/TWL XL LVL3 (GOWN DISPOSABLE) ×2 IMPLANT
HANDPIECE INTERPULSE COAX TIP (DISPOSABLE) ×2
IMMOBILIZER KNEE 20 (SOFTGOODS) ×2
IMMOBILIZER KNEE 20 THIGH 36 (SOFTGOODS) ×1 IMPLANT
MANIFOLD NEPTUNE II (INSTRUMENTS) ×2 IMPLANT
PACK TOTAL KNEE CUSTOM (KITS) ×2 IMPLANT
PAD ABD 8X10 STRL (GAUZE/BANDAGES/DRESSINGS) ×1 IMPLANT
PADDING CAST COTTON 6X4 STRL (CAST SUPPLIES) ×6 IMPLANT
POSITIONER SURGICAL ARM (MISCELLANEOUS) ×2 IMPLANT
SET HNDPC FAN SPRY TIP SCT (DISPOSABLE) ×1 IMPLANT
STRIP CLOSURE SKIN 1/2X4 (GAUZE/BANDAGES/DRESSINGS) ×4 IMPLANT
SUT MNCRL AB 4-0 PS2 18 (SUTURE) ×2 IMPLANT
SUT VIC AB 2-0 CT1 27 (SUTURE) ×6
SUT VIC AB 2-0 CT1 TAPERPNT 27 (SUTURE) ×3 IMPLANT
SUT VLOC 180 0 24IN GS25 (SUTURE) ×2 IMPLANT
SYR 50ML LL SCALE MARK (SYRINGE) ×2 IMPLANT
TRAY FOLEY CATH SILVER 14FR (SET/KITS/TRAYS/PACK) ×1 IMPLANT
WRAP KNEE MAXI GEL POST OP (GAUZE/BANDAGES/DRESSINGS) ×2 IMPLANT
YANKAUER SUCT BULB TIP 10FT TU (MISCELLANEOUS) ×2 IMPLANT

## 2016-09-18 NOTE — Anesthesia Procedure Notes (Addendum)
Spinal  Patient location during procedure: OR Start time: 09/18/2016 2:54 PM End time: 09/18/2016 3:00 PM Reason for block: at surgeon's request Staffing Resident/CRNA: Anne Fu Performed: resident/CRNA  Preanesthetic Checklist Completed: patient identified, site marked, surgical consent, pre-op evaluation, timeout performed, IV checked, risks and benefits discussed and monitors and equipment checked Spinal Block Patient position: sitting Prep: DuraPrep Patient monitoring: heart rate, continuous pulse ox and blood pressure Approach: midline Location: L2-3 Injection technique: single-shot Needle Needle type: Pencan  Needle gauge: 24 G Needle length: 9 cm Assessment Sensory level: T6 Additional Notes Expiration date of kit checked and confirmed. Patient tolerated procedure well, without complications.  X 2 attempts (right paramedian same level without success) with noted clear CSF return. Loss of motor and sensory on exam post injection.

## 2016-09-18 NOTE — Anesthesia Procedure Notes (Signed)
Anesthesia Regional Block:  Adductor canal block  Pre-Anesthetic Checklist: ,, timeout performed, Correct Patient, Correct Site, Correct Laterality, Correct Procedure, Correct Position, site marked, Risks and benefits discussed,  Surgical consent,  Pre-op evaluation,  At surgeon's request and post-op pain management  Laterality: Right  Prep: chloraprep       Needles:  Injection technique: Single-shot  Needle Type: Echogenic Needle     Needle Length: 9cm 9 cm Needle Gauge: 21 and 21 G    Additional Needles:  Procedures: ultrasound guided (picture in chart) Adductor canal block Narrative:  Start time: 09/18/2016 2:23 PM End time: 09/18/2016 2:29 PM Injection made incrementally with aspirations every 5 mL.  Performed by: Personally  Anesthesiologist: Tationa Stech  Additional Notes: Pt tolerated the procedure well.

## 2016-09-18 NOTE — Anesthesia Preprocedure Evaluation (Signed)
Anesthesia Evaluation  Patient identified by MRN, date of birth, ID band Patient awake    Reviewed: Allergy & Precautions, H&P , NPO status , Patient's Chart, lab work & pertinent test results  Airway Mallampati: II   Neck ROM: full    Dental   Pulmonary neg pulmonary ROS,    breath sounds clear to auscultation       Cardiovascular hypertension,  Rhythm:regular Rate:Normal     Neuro/Psych PSYCHIATRIC DISORDERS Anxiety Depression    GI/Hepatic GERD  ,  Endo/Other    Renal/GU stones     Musculoskeletal  (+) Arthritis , Fibromyalgia -  Abdominal   Peds  Hematology   Anesthesia Other Findings   Reproductive/Obstetrics                             Anesthesia Physical Anesthesia Plan  ASA: II  Anesthesia Plan: Spinal   Post-op Pain Management:  Regional for Post-op pain   Induction: Intravenous  Airway Management Planned: Simple Face Mask  Additional Equipment:   Intra-op Plan:   Post-operative Plan:   Informed Consent: I have reviewed the patients History and Physical, chart, labs and discussed the procedure including the risks, benefits and alternatives for the proposed anesthesia with the patient or authorized representative who has indicated his/her understanding and acceptance.     Plan Discussed with: CRNA, Anesthesiologist and Surgeon  Anesthesia Plan Comments:         Anesthesia Quick Evaluation

## 2016-09-18 NOTE — Transfer of Care (Signed)
Immediate Anesthesia Transfer of Care Note  Patient: Alexandra Gibson  Procedure(s) Performed: Procedure(s) with comments: RIGHT TOTAL KNEE ARTHROPLASTY (Right) - Adductor Block  Patient Location: PACU  Anesthesia Type:Spinal  Level of Consciousness:  sedated, patient cooperative and responds to stimulation  Airway & Oxygen Therapy:Patient Spontanous Breathing and Patient connected to face mask oxgen  Post-op Assessment:  Report given to PACU RN and Post -op Vital signs reviewed and stable  Post vital signs:  Reviewed and stable  Last Vitals: There were no vitals filed for this visit.  Complications: No apparent anesthesia complications

## 2016-09-18 NOTE — H&P (View-Only) (Signed)
Alexandra Gibson DOB: 18-Jul-1951 Married / Language: English / Race: American [place] or [place] Native Female Date of Admission:  09/18/2016 CC:  Right Knee Pain History of Present Illness The patient is a 66 year old female who comes in for a preoperative History and Physical. The patient is scheduled for a right total knee arthroplasty to be performed by Dr. Dione Plover. Aluisio, MD at [facility] on 09-18-2016. The patient is a 66 year old female who presented for follow up of their hip adn their knee. The patient is being followed for their right hip pain and osteoarthritis. They are month(s) out from when symptoms began. The patient feels that they are doing poorly and report their pain level to be moderate. The following medication has been used for pain control: none. Seh is also several nonths out from the left total hip arthroplasty. The patient states that she is doing very well at this time. The pain is under excellent control at this time and describes their pain as mild (tenderness). They are currently on no medication for their pain. The patient is currently doing home exercise program. The patient feels that they are progressing well at this time. Her main issue at this time is the right knee. The patient is being followed for their right knee pain and osteoarthritis. They are now months out from cortisone injection. Symptoms reported include: pain at night and aching. The patient feels that they are doing well and report their pain level to be mild. The following medication has been used for pain control: none. The patient has reported temporary improvement of their symptoms with: Cortisone injections. It is felt that she would benefit from undergoing total knee procedure. She would like to proceed at this time with the right knee surgery. They have been treated conservatively in the past for the above stated problem and despite conservative measures, they continue to have progressive  pain and severe functional limitations and dysfunction. They have failed non-operative management including home exercise, medications, and injections. It is felt that they would benefit from undergoing total joint replacement. Risks and benefits of the procedure have been discussed with the patient and they elect to proceed with surgery. There are no active contraindications to surgery such as ongoing infection or rapidly progressive neurological disease.  Problem List/Past Medical Status post total left knee replacement [id number])  Primary osteoarthritis of left hip (M16.12)  Fibromyalgia  High blood pressure  Hypercholesterolemia  Osteoporosis  Gastroesophageal Reflux Disease  Primary osteoarthritis of left knee (M17.12)  Allergies Crestor *ANTIHYPERLIPIDEMICS*   Family History Osteoarthritis  Mother. mother Hypertension  mother Congestive Heart Failure  father  Social History Tobacco use  Never smoker. never smoker Alcohol use  never consumed alcohol Living situation  live alone Illicit drug use  no Exercise  Exercises rarely; does running / walking Pain Contract  no Number of flights of stairs before winded  less than 1 Marital status  divorced Drug/Alcohol Rehab (Previously)  no No alcohol use  Drug/Alcohol Rehab (Currently)  no Current work status  disabled Children  2  Medication History Magnesium (500MG  Tablet, Oral) Active. Omega 3 (1000MG  Capsule, Oral) Active. Vitamin B-12 (Oral) Specific strength unknown - Active. TraMADol HCl (50MG  Tablet, Oral) Active. Escitalopram Oxalate (5MG /5ML Solution, Oral) Active. AmLODIPine Besylate (5MG  Tablet, Oral) Active. Irbesartan (300MG  Tablet, Oral) Active. Pravastatin Sodium (40MG  Tablet, Oral) Active.  Past Surgical History  Appendectomy  Total Hip Replacement - Left [08/04/2015]: Arthroscopy of Knee  left Total Knee  Replacement - Left  Date: 06/2014.    Review of Systems   General Not Present- Chills, Fatigue, Fever, Memory Loss, Night Sweats, Weight Gain and Weight Loss. Skin Not Present- Eczema, Hives, Itching, Lesions and Rash. HEENT Not Present- Dentures, Double Vision, Headache, Hearing Loss, Tinnitus and Visual Loss. Respiratory Not Present- Allergies, Chronic Cough, Coughing up blood, Shortness of breath at rest and Shortness of breath with exertion. Cardiovascular Not Present- Chest Pain, Difficulty Breathing Lying Down, Murmur, Palpitations, Racing/skipping heartbeats and Swelling. Gastrointestinal Not Present- Abdominal Pain, Bloody Stool, Constipation, Diarrhea, Difficulty Swallowing, Heartburn, Jaundice, Loss of appetitie, Nausea and Vomiting. Female Genitourinary Not Present- Blood in Urine, Discharge, Flank Pain, Incontinence, Painful Urination, Urgency, Urinary frequency, Urinary Retention, Urinating at Night and Weak urinary stream. Musculoskeletal Present- Back Pain, Joint Pain, Morning Stiffness and Muscle Weakness. Not Present- Joint Swelling, Muscle Pain and Spasms. Neurological Not Present- Blackout spells, Difficulty with balance, Dizziness, Paralysis, Tremor and Weakness. Psychiatric Not Present- Insomnia.  Vitals  Weight: 190 lb Height: 67.5in Body Surface Area: 1.99 m Body Mass Index: 29.32 kg/m  Pulse: 68 (Regular)  BP: 128/78 (Sitting, Right Arm, Standard)  Physical Exam General Mental Status -Alert, cooperative and good historian. General Appearance-pleasant, Not in acute distress. Orientation-Oriented X3. Build & Nutrition-Well nourished and Well developed.  Head and Neck Head-normocephalic, atraumatic . Neck Global Assessment - supple, no bruit auscultated on the right, no bruit auscultated on the left.  Eye Pupil - Bilateral-Regular and Round. Motion - Bilateral-EOMI.  Chest and Lung Exam Auscultation Breath sounds - clear at anterior chest wall and clear at posterior chest wall. Adventitious  sounds - No Adventitious sounds.  Cardiovascular Auscultation Rhythm - Regular rate and rhythm. Heart Sounds - S1 WNL and S2 WNL. Murmurs & Other Heart Sounds - Auscultation of the heart reveals - No Murmurs.  Abdomen Palpation/Percussion Tenderness - Abdomen is non-tender to palpation. Rigidity (guarding) - Abdomen is soft. Auscultation Auscultation of the abdomen reveals - Bowel sounds normal.  Female Genitourinary Note: Not done, not pertinent to present illness   Musculoskeletal Note: She is alert and oriented. She is in no acute distress. Left hip, flexion to 110 degrees, internal rotation 20, external rotation 20, abduction 20. Incisions healed, no signs of infection. No pain with passive or active range of motion of the left hip. The right hip, she has slight discomfort with internal rotation. Otherwise, not able to reproduce discomfort on exam. The right knee, she is tender along the lateral joint line. Moderate patellofemoral crepitus. Does have a slight valgus deformity noted. No effusion or instability noted. Distal pulses 2+. Sensation and motor function intact in lower extremities.  IMAGING AP and lateral views of the left hip show the prosthesis in excellent position, no periprosthetic abnormalities. The right hip, there is obviously some joint space narrowing, does not appear to be quite bone on bone just yet. There is some mild cystic formation as well as osteophyte formation. Did review x-rays of her right knee, which shows that she is completely bone on bone in the lateral compartment causing that valgus deformity.  Assessment & Plan Status post total left knee replacement [id number]) Aftercare following left hip joint replacement surgery (Z47.1, [id number]) Primary osteoarthritis of right hip (M16.11) Primary osteoarthritis of right knee (M17.11)  Note:Surgical Plans: Right Total Knee Replacement  Disposition: Home versus SNF  PCP: Dr. Rowe Clack  IV  TXA  Anesthesia Issues: None  Patient was instructed on what medications to stop prior to surgery.  Signed electronically  by Ok Edwards, III PA-C

## 2016-09-18 NOTE — Op Note (Signed)
Pre-operative diagnosis- Osteoarthritis Right knee(s)  Post-operative diagnosis- Osteoarthritis  Right knee(s)  Procedure-   Right Total Knee Arthroplasty  Surgeon- Dione Plover. Cervando Durnin, MD  Assistant- Arlee Muslim, PA-C   Anesthesia-  Adductor canal block and spinal   EBL- * No blood loss amount entered *   Drains Hemovac x 1   Tourniquet time  Total Tourniquet Time Documented: Thigh (Right) - 34 minutes Total: Thigh (Right) - 34 minutes    Complications- None  Condition-PACU - hemodynamically stable.   Brief Clinical Note  Alexandra Gibson is a 66 y.o. year old female with end stage OA of her right knee with progressively worsening pain and dysfunction. She has constant pain, with activity and at rest and significant functional deficits with difficulties even with ADLs. She has had extensive non-op management including analgesics, injections of cortisone and viscosupplements, and home exercise program, but remains in significant pain with significant dysfunction.Radiographs show bone on bone arthritis lateral and patellofemoral with severe valgus deformity. She presents now for right Total Knee Arthroplasty.    Procedure in detail---       The patient is brought into the operating room and positioned supine on the operating table. After successful administration of Adductor canal block and spinal anesthetic, a tourniquet is placed high on the Right thigh(s) and the lower extremity is prepped and draped in the usual sterile fashion. Time out is performed by the operating team and then the Right  lower extremity is wrapped in Esmarch, knee flexed and the tourniquet inflated to 300 mmHg.       A midline incision is made with a ten blade through the subcutaneous tissue to the level of the extensor mechanism. A fresh blade is used to make a lateral parapatellar arthrotomy due to the patients' valgus deformity. Soft tissue over the proximal lateral tibia is subperiosteally elevated to the joint  line with a knife to the posterolateral corner but not including the structures of the posterolateral corner. Soft tissue over the proximal medial tibia is elevated with attention being paid to avoiding the patellar tendon on the tibial tubercle. The patella is everted medially, knee flexed 90 degrees and the ACL and PCL are removed. Findings are bone on bone lateral and patellofemoral with massive lateral and patellar osteophytes. .       The drill is used to create a starting hole in the distal femur and the canal is thoroughly irrigated with sterile saline to remove the fatty contents. The 5 degree Right  valgus alignment guide is placed into the femoral canal and the distal femoral cutting block is pinned to remove 9  mm off the distal femur. Resection is made with an oscillating saw.      The tibia is subluxed forward and the menisci are removed. The extramedullary alignment guide is placed referencing proximally at the medial aspect of the tibial tubercle and distally along the second metatarsal axis and tibial crest. The block is pinned to remove 12mm off the more deficient lateral side. Resection is made with an oscillating saw. Size 6  is the most appropriate size for the tibia and the proximal tibia is prepared with the modular drill and keel punch for that size.      The femoral sizing guide is placed and size 7  is most appropriate. Rotation is marked off the epicondylar axis and confirmed by creating a rectangular flexion gap at 90 degrees. The size 7  cutting block is pinned in this rotation and  the anterior, posterior and chamfer cuts are made with the oscillating saw. The intercondylar block is then placed and that cut is made.      Trial size 6  tibial component, trial size 7  posterior stabilized femur and a 8  mm posterior stabilized rotating platform insert trial is placed. Full extension is achieved with excellent varus/valgus and   anterior/posterior balance throughout full range of motion.  The patella is everted and thickness measured to be 22  mm. Free hand resection is taken to 12 mm, a 38 template is placed, lug holes are drilled, trial patella is placed, and it tracks normally. Osteophytes are removed off the posterior femur with the trial in place. All trials are removed and the cut bone surfaces prepared with pulsatile lavage. Cement is mixed and once ready for implantation, the size 6  tibial implant, size 7 posterior stabilized femoral component, and the size 38  patella are cemented in place and the patella is held with the clamp. The trial insert is placed and the knee held in full extension. The Exparel (20 ml mixed with 30 ml saline) is injected into the extensor mechanism, posterior capsule, medial and lateral gutters and subcutaneous tissues. All extruded cement is removed and once the cement is hard the permanent 8  mm posterior stabilized rotating platform insert is placed into the tibial tray.      The wound is copiously irrigated with saline solution and the tourniquet is released for a total tourniquet time of 34  minutes. Bleeding is identified and controlled with electrocautery. The extensor mechanism is closed with interrupted #1 V-loc leaving open a small area from the superior to inferior pole of the patella to serve as a mini lateral release. Flexion against gravity is 135  degrees and the patella tracks normally. Subcutaneous tissue is closed with 2.0 vicryl and subcuticular with running 4.0 Monocryl.The incision is cleaned and dried and steri-strips and a bulky sterile dressing are applied. The limb is placed into a knee immobilizer and the patient is awakened and transported to recovery in stable condition.      Please note that a surgical assistant was a medical necessity for this procedure in order to perform it in a safe and expeditious manner. Surgical assistant was necessary to retract the ligaments and vital neurovascular structures to prevent injury to them and  also necessary for proper positioning of the limb to allow for anatomic placement of the prosthesis.    Dione Plover Elizabeth Haff, MD    09/18/2016, 4:09 PM

## 2016-09-18 NOTE — Anesthesia Postprocedure Evaluation (Signed)
Anesthesia Post Note  Patient: Alexandra Gibson  Procedure(s) Performed: Procedure(s) (LRB): RIGHT TOTAL KNEE ARTHROPLASTY (Right)  Patient location during evaluation: PACU Anesthesia Type: Spinal Level of consciousness: awake, awake and alert and oriented Pain management: pain level controlled Vital Signs Assessment: post-procedure vital signs reviewed and stable Respiratory status: spontaneous breathing, nonlabored ventilation and respiratory function stable Cardiovascular status: blood pressure returned to baseline Anesthetic complications: no       Last Vitals:  Vitals:   09/18/16 1730 09/18/16 1748  BP: 135/70 (!) 143/85  Pulse: 69 72  Resp: 17 16  Temp:  36.4 C    Last Pain:  Vitals:   09/18/16 1730  PainSc: 0-No pain                 [name]

## 2016-09-18 NOTE — Interval H&P Note (Signed)
History and Physical Interval Note:  09/18/2016 2:49 PM  Alexandra Gibson  has presented today for surgery, with the diagnosis of right knee osteoarthritis  The various methods of treatment have been discussed with the patient and family. After consideration of risks, benefits and other options for treatment, the patient has consented to  Procedure(s) with comments: RIGHT TOTAL KNEE ARTHROPLASTY (Right) - Adductor Block as a surgical intervention .  The patient's history has been reviewed, patient examined, no change in status, stable for surgery.  I have reviewed the patient's chart and labs.  Questions were answered to the patient's satisfaction.     Gearlean Alf

## 2016-09-19 ENCOUNTER — Encounter (HOSPITAL_COMMUNITY): Payer: Self-pay | Admitting: Orthopedic Surgery

## 2016-09-19 LAB — BASIC METABOLIC PANEL
ANION GAP: 9 (ref 5–15)
BUN: 12 mg/dL (ref 6–20)
CALCIUM: 10 mg/dL (ref 8.9–10.3)
CO2: 27 mmol/L (ref 22–32)
Chloride: 101 mmol/L (ref 101–111)
Creatinine, Ser: 0.87 mg/dL (ref 0.44–1.00)
GFR calc non Af Amer: >60 mL/min (ref >60)
GLUCOSE: 193 mg/dL — AB (ref 65–99)
POTASSIUM: 5 mmol/L (ref 3.5–5.1)
Sodium: 137 mmol/L (ref 135–145)

## 2016-09-19 LAB — CBC
HEMATOCRIT: 39 % (ref 36.0–46.0)
HEMOGLOBIN: 12.6 g/dL (ref 12.0–15.0)
MCH: 29.2 pg (ref 26.0–34.0)
MCHC: 32.3 g/dL (ref 30.0–36.0)
MCV: 90.5 fL (ref 78.0–100.0)
Platelets: 269 10*3/uL (ref 150–400)
RBC: 4.31 MIL/uL (ref 3.87–5.11)
RDW: 13.6 % (ref 11.5–15.5)
WBC: 16.9 10*3/uL — AB (ref 4.0–10.5)

## 2016-09-19 MED ORDER — TRAMADOL HCL 50 MG PO TABS: 50.0000 mg | ORAL_TABLET | Freq: Four times a day (QID) | ORAL | 0 refills | Status: DC | PRN

## 2016-09-19 MED ORDER — RIVAROXABAN 10 MG PO TABS: 10.0000 mg | ORAL_TABLET | Freq: Every day | ORAL | 0 refills | Status: DC

## 2016-09-19 MED ORDER — METHOCARBAMOL 500 MG PO TABS: 500.0000 mg | ORAL_TABLET | Freq: Four times a day (QID) | ORAL | 0 refills | Status: DC | PRN

## 2016-09-19 MED ORDER — OXYCODONE HCL 5 MG PO TABS: 5.0000 mg | ORAL_TABLET | ORAL | 0 refills | Status: DC | PRN

## 2016-09-19 NOTE — Evaluation (Signed)
Occupational Therapy Evaluation Patient Details Name: Alexandra Gibson MRN: [id number] DOB: 10/03/1950 Today's Date: 09/19/2016    History of Present Illness S/P R TKA ( has had L THR 2016; L TKR 2015). Please see EPIC for full PMH.   Clinical Impression   Pt is a 66 y/o female admitted as above. She currently presents with decreased ability to perform ADL and self care tasks (see OT Problem list below) and should benefit from acute OT to assist with increased independence with functional mobility, transfers and ADL/selfcare tasks. She is currently Min A for LB & functional transfers. She plans to d/c home with PRN assist from her husband & will stay on main level of her 2 story home. Focus on shower/toilet transfers & determine DME needs next visit.    Follow Up Recommendations  No OT follow up;Supervision/Assistance - 24 hour    Equipment Recommendations  Other (comment) (Cont to assess.)    Recommendations for Other Services       Precautions / Restrictions Precautions Precautions: Knee;Fall Restrictions Weight Bearing Restrictions: Yes RLE Weight Bearing: Weight bearing as tolerated      Mobility Bed Mobility Overal bed mobility:  (Pt sitting up in chair upon OT arrival)                Transfers Overall transfer level: Needs assistance Equipment used: Rolling walker (2 wheeled) Transfers: Sit to/from [facility] Sit to Stand: Min assist Stand pivot transfers: Min assist       General transfer comment: VC's for safety and sequencing with RW. Pt also tends to want to lift RW when ambulating    Balance Overall balance assessment: Needs assistance Sitting-balance support: Feet supported;No upper extremity supported Sitting balance-Leahy Scale: Good     Standing balance support: During functional activity;No upper extremity supported Standing balance-Leahy Scale: Poor Standing balance comment: Pt required physical Min A to maintain standing  balance when grooming statnding at sink.                             ADL Overall ADL's : Needs assistance/impaired Eating/Feeding: Set up;Sitting   Grooming: Wash/dry hands;Wash/dry face;Standing;Supervision/safety   Upper Body Bathing: Set up;Sitting   Lower Body Bathing: Sit to/from stand;Minimal assistance   Upper Body Dressing : Set up;Sitting   Lower Body Dressing: Minimal assistance;Sit to/from stand   Toilet Transfer: Min guard;Ambulation;RW;BSC   Toileting- Water quality scientist and Hygiene: Min guard;Sit to/from stand     Tub/Shower Transfer Details (indicate cue type and reason): Has walk in shower at home (upstairs) and plans to sponge bathe until she can get upstairs safely. Has tub down, but does not want to use. Functional mobility during ADLs: Min guard;Rolling walker;Cueing for safety General ADL Comments: Pt was seen for OT assessment followed by ADL retraining session to include discussion w/ pt/husband about home set-up and husband ability to assist with LB ADL's from [place] A level. Pt practiced toilet transfer by ambulating into bathroom using RW and R LE KI at [place] guard level for safety. Pt reports that she also needs a R THR "After I get better from this surgery" For this reason, we discussed recommendation of 3:1 over toilet at home to assist with knee and hip pain. Pt verbalized agreement with this. Discussed shower transfers and pt plans to sponge bathe until she can safely ambulate upstairs (does not want to use tub that is downstairs). Pt also participated in grooming while  standing at sink in bathroom. Also discussed pt getting foam/padding to place under couch cushions at home to assist with elevating seats and placing less stress on knees and hips.     Vision  Wears glasses at all time. No change from baseline.   Perception     Praxis      Pertinent Vitals/Pain Pain Assessment: 0-10 Pain Score: 4  Pain Location: R knee Pain Descriptors /  Indicators: Aching;Tender Pain Intervention(s): Limited activity within patient's tolerance;Monitored during session;Premedicated before session;Repositioned;Ice applied     Hand Dominance Right   Extremity/Trunk Assessment Upper Extremity Assessment Upper Extremity Assessment: Overall WFL for tasks assessed;Generalized weakness (Bilateral sholder arthritis - L > R. Overall WFL's)   Lower Extremity Assessment Lower Extremity Assessment: Defer to PT evaluation       Communication Communication Communication: No difficulties   Cognition Arousal/Alertness: Awake/alert Behavior During Therapy: WFL for tasks assessed/performed Overall Cognitive Status: Within Functional Limits for tasks assessed                     General Comments       Exercises       Shoulder Instructions      Home Living Family/patient expects to be discharged to:: Private residence Living Arrangements: Spouse/significant other Available Help at Discharge: Family Type of Home: House Home Access: Stairs to enter [employer] of Steps: 3 in front and 3 in from garage Entrance Stairs-Rails: [place]: Two level;Able to live on main level with bedroom/bathroom Alternate Level Stairs-Number of Steps: 17   Bathroom Shower/Tub: Tub/shower unit;Walk-in shower Sales executive is upstairs)   Biochemist, clinical: Standard     Home Equipment: Environmental consultant - 2 wheels;Cane - single point;Bedside commode;Crutches   Additional Comments: Pt has hand held shower head, it is not installed yet.      Prior Functioning/Environment Level of Independence: Independent with assistive device(s)        Comments: Ambulating with cane and crutches prior to this surgery. Ocassionally needed assist to don/doff shoes/socks at times.        OT Problem List: Pain;Decreased knowledge of precautions;Decreased activity tolerance;Decreased knowledge of use of DME or AE;Impaired balance (sitting and/or  standing);Other (comment) (h/o arthritis and multiple surgeries. Pt reports needing R THR "next" Balance to be addressed during ADL's but not w/ a specific goal)   OT Treatment/Interventions: Self-care/ADL training;DME and/or AE instruction;Therapeutic activities;Patient/family education    OT Goals(Current goals can be found in the care plan section) Acute Rehab OT Goals Patient Stated Goal: Decreased pain R knee OT Goal Formulation: With patient Time For Goal Achievement: 10/03/16 Potential to Achieve Goals: Good  OT Frequency: Min 2X/week   Barriers to D/C:            Co-evaluation              End of Session Equipment Utilized During Treatment: Gait belt;Rolling walker;Right knee immobilizer CPM Right Knee CPM Right Knee: Off Additional Comments: 4 hours per day Nurse Communication: Mobility status;Patient requests pain meds  Activity Tolerance: Patient tolerated treatment well Patient left: in chair;with call bell/phone within reach;with nursing/sitter in room   Time: 1023-1104 OT Time Calculation (min): 41 min Charges:  OT General Charges $OT Visit: 1 Procedure OT Evaluation $OT Eval Low Complexity: 1 Procedure OT Treatments $Self Care/Home Management : 23-37 mins G-Codes:    Almyra Deforest, OTR/L 09/19/2016, 11:25 AM

## 2016-09-19 NOTE — Progress Notes (Signed)
   Subjective: 1 Day Post-Op Procedure(s) (LRB): RIGHT TOTAL KNEE ARTHROPLASTY (Right) Patient reports pain as mild.   Patient seen in rounds for Dr. Wynelle Link. Patient is well, but has had some minor complaints of pain in the knee, requiring pain medications We will start therapy today.  Plan is to go Home after hospital stay.  Objective: Vital signs in last 24 hours: Temp:  [98.1 F (36.7 C)-99 F (37.2 C)] 99 F (37.2 C) (02/06 1726) Pulse Rate:  [65-110] 82 (02/06 1726) Resp:  [14-22] 16 (02/06 1726) BP: (133-158)/(72-96) 158/90 (02/06 1726) SpO2:  [97 %-100 %] 98 % (02/06 1726)  Intake/Output from previous day:  Intake/Output Summary (Last 24 hours) at 09/19/16 2110 Last data filed at 09/19/16 1431  Gross per 24 hour  Intake          1686.25 ml  Output             1255 ml  Net           431.25 ml    Intake/Output this shift: No intake/output data recorded.  Labs:  Recent Labs  09/19/16 0449  HGB 12.6    Recent Labs  09/19/16 0449  WBC 16.9*  RBC 4.31  HCT 39.0  PLT 269    Recent Labs  09/19/16 0449  NA 137  K 5.0  CL 101  CO2 27  BUN 12  CREATININE 0.87  GLUCOSE 193*  CALCIUM 10.0   No results for input(s): LABPT, INR in the last 72 hours.  EXAM General - Patient is Alert, Appropriate and Oriented Extremity - Neurovascular intact Sensation intact distally Intact pulses distally Dorsiflexion/Plantar flexion intact Dressing - dressing C/D/I Motor Function - intact, moving foot and toes well on exam.  Hemovac pulled without difficulty.  Past Medical History:  Diagnosis Date  . Anxiety   . Anxiety and depression   . Arthritis   . Chronic kidney disease    Kidney stone  . Depression   . Fibromyalgia   . GERD (gastroesophageal reflux disease)   . History of kidney stones   . History of urinary tract infection   . Hyperlipidemia   . Hypertension   . Numbness    right hand/comes and goes   . Osteoarthritis of knee    bil-gets  injections  . Parathyroid tumor   . Urinary frequency   . Wears glasses     Assessment/Plan: 1 Day Post-Op Procedure(s) (LRB): RIGHT TOTAL KNEE ARTHROPLASTY (Right) Active Problems:   OA (osteoarthritis) of knee  Estimated body mass index is 29.32 kg/m as calculated from the following:   Height as of this encounter: 5' 7.5" (1.715 m).   Weight as of this encounter: 86.2 kg (190 lb). Advance diet Up with therapy Plan for discharge tomorrow Discharge home with home health  DVT Prophylaxis - Xarelto Weight-Bearing as tolerated to right leg D/C O2 and Pulse OX and try on Room Air  Arlee Muslim, PA-C Orthopaedic Surgery 09/19/2016, 9:10 PM

## 2016-09-19 NOTE — Discharge Instructions (Addendum)
° °Dr. Frank Aluisio °Total Joint Specialist °[facility] °[address] °[place], [place] [address] °[phone] ° °TOTAL KNEE REPLACEMENT POSTOPERATIVE DIRECTIONS ° °Knee Rehabilitation, Guidelines Following Surgery  °Results after knee surgery are often greatly improved when you follow the exercise, range of motion and muscle strengthening exercises prescribed by your doctor. Safety measures are also important to protect the knee from further injury. Any time any of these exercises cause you to have increased pain or swelling in your knee joint, decrease the amount until you are comfortable again and slowly increase them. If you have problems or questions, call your caregiver or physical therapist for advice.  ° °HOME CARE INSTRUCTIONS  °Remove items at home which could result in a fall. This includes throw rugs or furniture in walking pathways.  °· ICE to the affected knee every three hours for 30 minutes at a time and then as needed for pain and swelling.  Continue to use ice on the knee for pain and swelling from surgery. You may notice swelling that will progress down to the foot and ankle.  This is normal after surgery.  Elevate the leg when you are not up walking on it.   °· Continue to use the breathing machine which will help keep your temperature down.  It is common for your temperature to cycle up and down following surgery, especially at night when you are not up moving around and exerting yourself.  The breathing machine keeps your lungs expanded and your temperature down. °· Do not place pillow under knee, focus on keeping the knee straight while resting ° °DIET °You may resume your previous home diet once your are discharged from the hospital. ° °DRESSING / WOUND CARE / SHOWERING °You may shower 3 days after surgery, but keep the wounds dry during showering.  You may use an occlusive plastic wrap (Press'n Seal for example), NO SOAKING/SUBMERGING IN THE BATHTUB.  If the  bandage gets wet, change with a clean dry gauze.  If the incision gets wet, pat the wound dry with a clean towel. °You may start showering once you are discharged home but do not submerge the incision under water. Just pat the incision dry and apply a dry gauze dressing on daily. °Change the surgical dressing daily and reapply a dry dressing each time. ° °ACTIVITY °Walk with your walker as instructed. °Use walker as long as suggested by your caregivers. °Avoid periods of inactivity such as sitting longer than an hour when not asleep. This helps prevent blood clots.  °You may resume a sexual relationship in one month or when given the OK by your doctor.  °You may return to work once you are cleared by your doctor.  °Do not drive a car for 6 weeks or until released by you surgeon.  °Do not drive while taking narcotics. ° °WEIGHT BEARING °Weight bearing as tolerated with assist device (walker, cane, etc) as directed, use it as long as suggested by your surgeon or therapist, typically at least 4-6 weeks. ° °POSTOPERATIVE CONSTIPATION PROTOCOL °Constipation - defined medically as fewer than three stools per week and severe constipation as less than one stool per week. ° °One of the most common issues patients have following surgery is constipation.  Even if you have a regular bowel pattern at home, your normal regimen is likely to be disrupted due to multiple reasons following surgery.  Combination of anesthesia, postoperative narcotics, change in appetite and fluid intake all can affect your bowels.    In order to avoid complications following surgery, here are some recommendations in order to help you during your recovery period. ° °Colace (docusate) - Pick up an over-the-counter form of Colace or another stool softener and take twice a day as long as you are requiring postoperative pain medications.  Take with a full glass of water daily.  If you experience loose stools or diarrhea, hold the colace until you stool forms  back up.  If your symptoms do not get better within 1 week or if they get worse, check with your doctor. ° °Dulcolax (bisacodyl) - Pick up over-the-counter and take as directed by the product packaging as needed to assist with the movement of your bowels.  Take with a full glass of water.  Use this product as needed if not relieved by Colace only.  ° °MiraLax (polyethylene glycol) - Pick up over-the-counter to have on hand.  MiraLax is a solution that will increase the amount of water in your bowels to assist with bowel movements.  Take as directed and can mix with a glass of water, juice, soda, coffee, or tea.  Take if you go more than two days without a movement. °Do not use MiraLax more than once per day. Call your doctor if you are still constipated or irregular after using this medication for 7 days in a row. ° °If you continue to have problems with postoperative constipation, please contact the office for further assistance and recommendations.  If you experience "the worst abdominal pain ever" or develop nausea or vomiting, please contact the office immediatly for further recommendations for treatment. ° °ITCHING ° If you experience itching with your medications, try taking only a single pain pill, or even half a pain pill at a time.  You can also use Benadryl over the counter for itching or also to help with sleep.  ° °TED HOSE STOCKINGS °Wear the elastic stockings on both legs for three weeks following surgery during the day but you may remove then at night for sleeping. ° °MEDICATIONS °See your medication summary on the “After Visit Summary” that the nursing staff will review with you prior to discharge.  You may have some home medications which will be placed on hold until you complete the course of blood thinner medication.  It is important for you to complete the blood thinner medication as prescribed by your surgeon.  Continue your approved medications as instructed at time of  discharge. ° °PRECAUTIONS °If you experience chest pain or shortness of breath - call 911 immediately for transfer to the hospital emergency department.  °If you develop a fever greater that 101 F, purulent drainage from wound, increased redness or drainage from wound, foul odor from the wound/dressing, or calf pain - CONTACT YOUR SURGEON.   °                                                °FOLLOW-UP APPOINTMENTS °Make sure you keep all of your appointments after your operation with your surgeon and caregivers. You should call the office at the above phone number and make an appointment for approximately two weeks after the date of your surgery or on the date instructed by your surgeon outlined in the "After Visit Summary". ° ° °RANGE OF MOTION AND STRENGTHENING EXERCISES  °Rehabilitation of the knee is important following a knee injury or   an operation. After just a few days of immobilization, the muscles of the thigh which control the knee become weakened and shrink (atrophy). Knee exercises are designed to build up the tone and strength of the thigh muscles and to improve knee motion. Often times heat used for twenty to thirty minutes before working out will loosen up your tissues and help with improving the range of motion but do not use heat for the first two weeks following surgery. These exercises can be done on a training (exercise) mat, on the floor, on a table or on a bed. Use what ever works the best and is most comfortable for you Knee exercises include:  °Leg Lifts - While your knee is still immobilized in a splint or cast, you can do straight leg raises. Lift the leg to 60 degrees, hold for 3 sec, and slowly lower the leg. Repeat 10-20 times 2-3 times daily. Perform this exercise against resistance later as your knee gets better.  °Quad and Hamstring Sets - Tighten up the muscle on the front of the thigh (Quad) and hold for 5-10 sec. Repeat this 10-20 times hourly. Hamstring sets are done by pushing the  foot backward against an object and holding for 5-10 sec. Repeat as with quad sets.  °· Leg Slides: Lying on your back, slowly slide your foot toward your buttocks, bending your knee up off the floor (only go as far as is comfortable). Then slowly slide your foot back down until your leg is flat on the floor again. °· Angel Wings: Lying on your back spread your legs to the side as far apart as you can without causing discomfort.  °A rehabilitation program following serious knee injuries can speed recovery and prevent re-injury in the future due to weakened muscles. Contact your doctor or a physical therapist for more information on knee rehabilitation.  ° °IF YOU ARE TRANSFERRED TO A SKILLED REHAB FACILITY °If the patient is transferred to a skilled rehab facility following release from the hospital, a list of the current medications will be sent to the facility for the patient to continue.  When discharged from the skilled rehab facility, please have the facility set up the patient's Home Health Physical Therapy prior to being released. Also, the skilled facility will be responsible for providing the patient with their medications at time of release from the facility to include their pain medication, the muscle relaxants, and their blood thinner medication. If the patient is still at the rehab facility at time of the two week follow up appointment, the skilled rehab facility will also need to assist the patient in arranging follow up appointment in our office and any transportation needs. ° °MAKE SURE YOU:  °Understand these instructions.  °Get help right away if you are not doing well or get worse.  ° ° °Pick up stool softner and laxative for home use following surgery while on pain medications. °Do not submerge incision under water. °Please use good hand washing techniques while changing dressing each day. °May shower starting three days after surgery. °Please use a clean towel to pat the incision dry following  showers. °Continue to use ice for pain and swelling after surgery. °Do not use any lotions or creams on the incision until instructed by your surgeon. ° °Take Xarelto for two and a half more weeks following discharge from the hospital, then discontinue Xarelto. °Once the patient has completed the blood thinner regimen, then take a Baby 81 mg Aspirin daily for three   more weeks. ° °Information on my medicine - XARELTO® (Rivaroxaban) ° °This medication education was reviewed with me or my healthcare representative as part of my discharge preparation.  The pharmacist that spoke with me during my hospital stay was:  Glogovac,nikola, RPH ° °Why was Xarelto® prescribed for you? °Xarelto® was prescribed for you to reduce the risk of blood clots forming after orthopedic surgery. The medical term for these abnormal blood clots is venous thromboembolism (VTE). ° °What do you need to know about xarelto® ? °Take your Xarelto® ONCE DAILY at the same time every day. °You may take it either with or without food. ° °If you have difficulty swallowing the tablet whole, you may crush it and mix in applesauce just prior to taking your dose. ° °Take Xarelto® exactly as prescribed by your doctor and DO NOT stop taking Xarelto® without talking to the doctor who prescribed the medication.  Stopping without other VTE prevention medication to take the place of Xarelto® may increase your risk of developing a clot. ° °After discharge, you should have regular check-up appointments with your healthcare provider that is prescribing your Xarelto®.   ° °What do you do if you miss a dose? °If you miss a dose, take it as soon as you remember on the same day then continue your regularly scheduled once daily regimen the next day. Do not take two doses of Xarelto® on the same day.  ° °Important Safety Information °A possible side effect of Xarelto® is bleeding. You should call your healthcare provider right away if you experience any of the  following: °? Bleeding from an injury or your nose that does not stop. °? Unusual colored urine (red or dark brown) or unusual colored stools (red or black). °? Unusual bruising for unknown reasons. °? A serious fall or if you hit your head (even if there is no bleeding). ° °Some medicines may interact with Xarelto® and might increase your risk of bleeding while on Xarelto®. To help avoid this, consult your healthcare provider or pharmacist prior to using any new prescription or non-prescription medications, including herbals, vitamins, non-steroidal anti-inflammatory drugs (NSAIDs) and supplements. ° °This website has more information on Xarelto®: www.xarelto.com. ° ° ° °

## 2016-09-19 NOTE — Care Management Note (Signed)
Case Management Note  Patient Details  Name: Alexandra Gibson MRN: [id number] Date of Birth: [birth year]  Subjective/Objective:                  Right Total Knee Arthroplasty Action/Plan: Discharge planning Expected Discharge Date:  09/20/16               Expected Discharge Plan:  [place]  In-House Referral:     Discharge planning Services  CM Consult  Post Acute Care Choice:  Home Health Choice offered to:  Patient  DME Arranged:  N/A DME Agency:  NA  HH Arranged:  PT [place] Agency:  [facility] (formerly [facility])  Status of Service:  Completed, signed off  If discussed at [employer], dates discussed:    Additional Comments: CM met with pt in room to offer choice and pt requests [facility] to render [place]. CM has requested HHPT order if this is the MDs plan.  Referral given to [facility] rep, Tim who is waiting for orders.  Pt states she has all DME needed at home. No other CM needs were communicated. Dellie Catholic, RN 09/19/2016, 1:34 PM

## 2016-09-19 NOTE — Evaluation (Signed)
Physical Therapy Evaluation Patient Details Name: Alexandra Gibson MRN: [id number] DOB: 19-Apr-1951 Today's Date: 09/19/2016   History of Present Illness  Pt is a 66 year old s/p R TKA (hx of L THR 2016 and L TKR 2015)  Clinical Impression  Pt is s/p TKA resulting in the deficits listed below (see PT Problem List).  Pt will benefit from skilled PT to increase their independence and safety with mobility to allow discharge to the venue listed below.  Pt reports increased pain this morning however ambulated to tolerance.  Pt plans to d/c home likely tomorrow with assist from spouse.        Follow Up Recommendations Home health PT    Equipment Recommendations  None recommended by PT    Recommendations for Other Services       Precautions / Restrictions Precautions Precautions: Knee;Fall Required Braces or Orthoses: Knee Immobilizer - Right Restrictions Weight Bearing Restrictions: Yes RLE Weight Bearing: Weight bearing as tolerated      Mobility  Bed Mobility Overal bed mobility: Needs Assistance Bed Mobility: Supine to Sit     Supine to sit: Modified independent (Device/Increase time)     General bed mobility comments: pt sitting semi EOB on arrival bathing, self assist R LE over EOB  Transfers Overall transfer level: Needs assistance Equipment used: Rolling walker (2 wheeled) Transfers: Sit to/from Stand Sit to Stand: Min assist Stand pivot transfers: Min assist       General transfer comment: verbal cues for UE and LE positioning  Ambulation/Gait Ambulation/Gait assistance: Min guard Ambulation Distance (Feet): 50 Feet Assistive device: Rolling walker (2 wheeled) Gait Pattern/deviations: Step-to pattern;Decreased stance time - right;Antalgic     General Gait Details: verbal cues for sequence, RW positioning, step length, difficulty with full foot contact on floor due to pain however improved with distance  Stairs            Wheelchair Mobility     Modified Rankin (Stroke Patients Only)       Balance Overall balance assessment: Needs assistance Sitting-balance support: Feet supported;No upper extremity supported Sitting balance-Leahy Scale: Good     Standing balance support: During functional activity;No upper extremity supported Standing balance-Leahy Scale: Poor Standing balance comment: Pt required physical Min A to maintain standing balance when grooming statnding at sink.                              Pertinent Vitals/Pain Pain Assessment: 0-10 Pain Score: 4  Pain Location: R knee Pain Descriptors / Indicators: Aching;Sore Pain Intervention(s): Limited activity within patient's tolerance;Monitored during session;Repositioned    Home Living Family/patient expects to be discharged to:: Private residence Living Arrangements: Spouse/significant other Available Help at Discharge: Family Type of Home: House Home Access: Stairs to enter Entrance Stairs-Rails: Psychiatric nurse of Steps: 3 in front and 3 in from garage [place]: Two level;Able to live on main level with bedroom/bathroom Home Equipment: Gilford Rile - 2 wheels;Cane - single point;Bedside commode;Crutches Additional Comments: Pt has hand held shower head, it is not installed yet.    Prior Function Level of Independence: Independent with assistive device(s)         Comments: Ambulating with cane and crutches prior to this surgery. Ocassionally needed assist to don/doff shoes/socks at times.     Hand Dominance   Dominant Hand: Right    Extremity/Trunk Assessment   Upper Extremity Assessment Upper Extremity Assessment: Overall WFL for tasks assessed;Generalized weakness (  Bilateral sholder arthritis - L > R. Overall WFL's)    Lower Extremity Assessment Lower Extremity Assessment: RLE deficits/detail RLE Deficits / Details: unable to perform SLR, maintained KI, ROM TBA       Communication   Communication: No  difficulties  Cognition Arousal/Alertness: Awake/alert Behavior During Therapy: WFL for tasks assessed/performed Overall Cognitive Status: Within Functional Limits for tasks assessed                      General Comments      Exercises     Assessment/Plan    PT Assessment Patient needs continued PT services  PT Problem List Decreased strength;Decreased balance;Decreased mobility;Pain;Decreased knowledge of precautions          PT Treatment Interventions Functional mobility training;Stair training;Gait training;DME instruction;Therapeutic activities;Therapeutic exercise;Patient/family education    PT Goals (Current goals can be found in the Care Plan section)  Acute Rehab PT Goals Patient Stated Goal: Decreased pain R knee PT Goal Formulation: With patient Time For Goal Achievement: 09/23/16 Potential to Achieve Goals: Good    Frequency 7X/week   Barriers to discharge        Co-evaluation               End of Session Equipment Utilized During Treatment: Gait belt;Right knee immobilizer Activity Tolerance: Patient limited by pain Patient left: in chair;with call bell/phone within reach;with family/visitor present           Time: 0951-1008 PT Time Calculation (min) (ACUTE ONLY): 17 min   Charges:   PT Evaluation $PT Eval Low Complexity: 1 Procedure     PT G Codes:        Elkin Belfield,KATHrine E 09/19/2016, 11:55 AM Carmelia Bake, PT, DPT 09/19/2016 Pager: [phone]

## 2016-09-19 NOTE — Progress Notes (Signed)
Physical Therapy Treatment Note    09/19/16 1500  PT Visit Information  Last PT Received On 09/19/16  Assistance Needed +1  History of Present Illness Pt is a 66 year old s/p R TKA (hx of L THR 2016 and L TKR 2015)  Subjective Data  Subjective Pt just received morphine prior to session and little slow to process and groggy.  Pt performed a couple LE exercises and ambulated in hallway.  Pt reports pain much better after meds.  Precautions  Precautions Knee;Fall  Required Braces or Orthoses Knee Immobilizer - Right  Restrictions  RLE Weight Bearing WBAT  Pain Assessment  Pain Assessment 0-10  Pain Score 3  Pain Location R knee  Pain Descriptors / Indicators Aching;Sore  Pain Intervention(s) Monitored during session;Repositioned;Premedicated before session  Cognition  Arousal/Alertness Awake/alert  Behavior During Therapy WFL for tasks assessed/performed  Overall Cognitive Status Within Functional Limits for tasks assessed  Bed Mobility  Overal bed mobility Needs Assistance  Bed Mobility Supine to Sit;Sit to Supine  Supine to sit Supervision  Sit to supine Supervision  General bed mobility comments self assisted R LE, increased time and effort  Transfers  Overall transfer level Needs assistance  Equipment used Rolling walker (2 wheeled)  Transfers Sit to/from Stand  Sit to Stand Min guard  General transfer comment verbal cues for UE and LE positioning  Ambulation/Gait  Ambulation/Gait assistance Min guard  Ambulation Distance (Feet) 100 Feet  Assistive device Rolling walker (2 wheeled)  Gait Pattern/deviations Decreased stance time - right;Antalgic;Step-through pattern  General Gait Details verbal cues for sequencing, RW positioning  Exercises  Exercises Total Joint  Total Joint Exercises  Quad Sets AROM;Right;10 reps  Short Arc [employer];Right;10 reps  Heel Slides AAROM;Right;10 reps  Goniometric ROM R knee AAROM approx 45* limited by pain  PT - End of Session   Equipment Utilized During Treatment Gait belt;Right knee immobilizer  Activity Tolerance Patient tolerated treatment well  Patient left in bed;with call bell/phone within reach  PT - Assessment/Plan  PT Plan Current plan remains appropriate  PT Frequency (ACUTE ONLY) 7X/week  Follow Up Recommendations Home health PT  PT equipment None recommended by PT  PT Goal Progression  Progress towards PT goals Progressing toward goals  PT Time Calculation  PT Start Time (ACUTE ONLY) 1429  PT Stop Time (ACUTE ONLY) 1451  PT Time Calculation (min) (ACUTE ONLY) 22 min  PT General Charges  $$ ACUTE PT VISIT 1 Procedure  PT Treatments  $Gait Training 8-22 mins   Carmelia Bake, PT, DPT 09/19/2016 Pager: [phone]

## 2016-09-19 NOTE — Discharge Summary (Signed)
Physician Discharge Summary   Patient ID: Alexandra Gibson MRN: [id number] DOB/AGE: 1950-12-02 66 y.o.  Admit date: 09/18/2016 Discharge date: 09-20-2016  Primary Diagnosis:  Osteoarthritis Right knee(s)  Admission Diagnoses:  Past Medical History:  Diagnosis Date  . Anxiety   . Anxiety and depression   . Arthritis   . Chronic kidney disease    Kidney stone  . Depression   . Fibromyalgia   . GERD (gastroesophageal reflux disease)   . History of kidney stones   . History of urinary tract infection   . Hyperlipidemia   . Hypertension   . Numbness    right hand/comes and goes   . Osteoarthritis of knee    bil-gets injections  . Parathyroid tumor   . Urinary frequency   . Wears glasses    Discharge Diagnoses:   Active Problems:   OA (osteoarthritis) of knee  Estimated body mass index is 29.32 kg/m as calculated from the following:   Height as of this encounter: 5' 7.5" (1.715 m).   Weight as of this encounter: 86.2 kg (190 lb).  Procedure:  Procedure(s) (LRB): RIGHT TOTAL KNEE ARTHROPLASTY (Right)   Consults: None  HPI: Alexandra Gibson is a 66 y.o. year old female with end stage OA of her right knee with progressively worsening pain and dysfunction. She has constant pain, with activity and at rest and significant functional deficits with difficulties even with ADLs. She has had extensive non-op management including analgesics, injections of cortisone and viscosupplements, and home exercise program, but remains in significant pain with significant dysfunction.Radiographs show bone on bone arthritis lateral and patellofemoral with severe valgus deformity. She presents now for right Total Knee Arthroplasty.   Laboratory Data: Admission on 09/18/2016  Component Date Value Ref Range Status  . WBC 09/19/2016 16.9* 4.0 - 10.5 K/uL Final  . RBC 09/19/2016 4.31  3.87 - 5.11 MIL/uL Final  . Hemoglobin 09/19/2016 12.6  12.0 - 15.0 g/dL Final  . HCT 09/19/2016 39.0  36.0 - 46.0 %  Final  . MCV 09/19/2016 90.5  78.0 - 100.0 fL Final  . MCH 09/19/2016 29.2  26.0 - 34.0 pg Final  . MCHC 09/19/2016 32.3  30.0 - 36.0 g/dL Final  . RDW 09/19/2016 13.6  11.5 - 15.5 % Final  . Platelets 09/19/2016 269  150 - 400 K/uL Final  . Sodium 09/19/2016 137  135 - 145 mmol/L Final  . Potassium 09/19/2016 5.0  3.5 - 5.1 mmol/L Final  . Chloride 09/19/2016 101  101 - 111 mmol/L Final  . CO2 09/19/2016 27  22 - 32 mmol/L Final  . Glucose, Bld 09/19/2016 193* 65 - 99 mg/dL Final  . BUN 09/19/2016 12  6 - 20 mg/dL Final  . Creatinine, Ser 09/19/2016 0.87  0.44 - 1.00 mg/dL Final  . Calcium 09/19/2016 10.0  8.9 - 10.3 mg/dL Final  . GFR calc non Af Amer 09/19/2016 >60  >60 mL/min Final  . GFR calc Af Amer 09/19/2016 >60  >60 mL/min Final   Comment: (NOTE) The eGFR has been calculated using the CKD EPI equation. This calculation has not been validated in all clinical situations. eGFR's persistently <60 mL/min signify possible Chronic Kidney Disease.   [name] gap 09/19/2016 9  5 - 15 Final  Hospital Outpatient Visit on 09/13/2016  Component Date Value Ref Range Status  . MRSA, PCR 09/13/2016 NEGATIVE  NEGATIVE Final  . Staphylococcus aureus 09/13/2016 POSITIVE* NEGATIVE Final   Comment:  The Xpert SA Assay (FDA approved for NASAL specimens in patients over 33 years of age), is one component of a comprehensive surveillance program.  Test performance has been validated by [facility] for patients greater than or equal to 58 year old. It is not intended to diagnose infection nor to guide or monitor treatment.   Marland Kitchen aPTT 09/13/2016 30  24 - 36 seconds Final  . WBC 09/13/2016 8.8  4.0 - 10.5 K/uL Final  . RBC 09/13/2016 4.80  3.87 - 5.11 MIL/uL Final  . Hemoglobin 09/13/2016 13.9  12.0 - 15.0 g/dL Final  . HCT 09/13/2016 43.4  36.0 - 46.0 % Final  . MCV 09/13/2016 90.4  78.0 - 100.0 fL Final  . MCH 09/13/2016 29.0  26.0 - 34.0 pg Final  . MCHC 09/13/2016 32.0  30.0 - 36.0  g/dL Final  . RDW 09/13/2016 13.7  11.5 - 15.5 % Final  . Platelets 09/13/2016 297  150 - 400 K/uL Final  . Sodium 09/13/2016 140  135 - 145 mmol/L Final  . Potassium 09/13/2016 4.4  3.5 - 5.1 mmol/L Final  . Chloride 09/13/2016 106  101 - 111 mmol/L Final  . CO2 09/13/2016 27  22 - 32 mmol/L Final  . Glucose, Bld 09/13/2016 85  65 - 99 mg/dL Final  . BUN 09/13/2016 13  6 - 20 mg/dL Final  . Creatinine, Ser 09/13/2016 0.86  0.44 - 1.00 mg/dL Final  . Calcium 09/13/2016 10.7* 8.9 - 10.3 mg/dL Final  . Total Protein 09/13/2016 7.5  6.5 - 8.1 g/dL Final  . Albumin 09/13/2016 4.6  3.5 - 5.0 g/dL Final  . AST 09/13/2016 19  15 - 41 U/L Final  . ALT 09/13/2016 17  14 - 54 U/L Final  . Alkaline Phosphatase 09/13/2016 101  38 - 126 U/L Final  . Total Bilirubin 09/13/2016 0.5  0.3 - 1.2 mg/dL Final  . GFR calc non Af Amer 09/13/2016 >60  >60 mL/min Final  . GFR calc Af Amer 09/13/2016 >60  >60 mL/min Final   Comment: (NOTE) The eGFR has been calculated using the CKD EPI equation. This calculation has not been validated in all clinical situations. eGFR's persistently <60 mL/min signify possible Chronic Kidney Disease.   . Anion gap 09/13/2016 7  5 - 15 Final  . Prothrombin Time 09/13/2016 13.0  11.4 - 15.2 seconds Final  . INR 09/13/2016 0.98   Final  . ABO/RH(D) 09/13/2016 O POS   Final  . Antibody Screen 09/13/2016 NEG   Final  . Sample Expiration 09/13/2016 09/21/2016   Final  . Extend sample reason 09/13/2016 NO TRANSFUSIONS OR PREGNANCY IN THE PAST 3 MONTHS   Final     X-Rays:No results found.  EKG: Orders placed or performed during the hospital encounter of 09/13/16  . EKG 12-Lead  . EKG 12-Lead     Hospital Course: Alexandra Gibson is a 66 y.o. who was admitted to [facility]. They were brought to the operating room on 09/18/2016 and underwent Procedure(s): RIGHT TOTAL KNEE ARTHROPLASTY.  Patient tolerated the procedure well and was later transferred to the recovery room  and then to the orthopaedic floor for postoperative care.  They were given PO and IV analgesics for pain control following their surgery.  They were given 24 hours of postoperative antibiotics of  Anti-infectives    Start     Dose/Rate Route Frequency Ordered Stop   09/18/16 2100  ceFAZolin (ANCEF) IVPB 2g/100 mL premix  2 g 200 mL/hr over 30 Minutes Intravenous Every 6 hours 09/18/16 1752 09/19/16 0403   09/18/16 1157  ceFAZolin (ANCEF) IVPB 2g/100 mL premix     2 g 200 mL/hr over 30 Minutes Intravenous On call to O.R. 09/18/16 1157 09/18/16 1510     and started on DVT prophylaxis in the form of Xarelto.   PT and OT were ordered for total joint protocol.  Discharge planning consulted to help with postop disposition and equipment needs.  Patient had a decent night on the evening of surgery.  They started to get up OOB with therapy on day one. Hemovac drain was pulled without difficulty.  Continued to work with therapy into day two.  Dressing was changed on day two and the incision was healing well.  Patient was seen in rounds on day two  and was ready to go home.  Discharge home - Straight to outpatinet Diet - Cardiac diet and Renal diet Follow up - in 2 weeks Activity - WBAT Disposition - Home Condition Upon Discharge - Stable D/C Meds - See DC Summary DVT Prophylaxis - Xarelto   Discharge Instructions    Call MD / Call 911    Complete by:  As directed    If you experience chest pain or shortness of breath, CALL 911 and be transported to the hospital emergency room.  If you develope a fever above 101 F, pus (white drainage) or increased drainage or redness at the wound, or calf pain, call your surgeon's office.   Change dressing    Complete by:  As directed    Change dressing daily with sterile 4 x 4 inch gauze dressing and apply TED hose. Do not submerge the incision under water.   Constipation Prevention    Complete by:  As directed    Drink plenty of fluids.  Prune juice may be  helpful.  You may use a stool softener, such as Colace (over the counter) 100 mg twice a day.  Use MiraLax (over the counter) for constipation as needed.   Diet - low sodium heart healthy    Complete by:  As directed    Discharge instructions    Complete by:  As directed    Pick up stool softner and laxative for home use following surgery while on pain medications. Do not submerge incision under water. Please use good hand washing techniques while changing dressing each day. May shower starting three days after surgery. Please use a clean towel to pat the incision dry following showers. Continue to use ice for pain and swelling after surgery. Do not use any lotions or creams on the incision until instructed by your surgeon.  Wear both TED hose on both legs during the day every day for three weeks, but may have off at night at home.  Postoperative Constipation Protocol  Constipation - defined medically as fewer than three stools per week and severe constipation as less than one stool per week.  One of the most common issues patients have following surgery is constipation.  Even if you have a regular bowel pattern at home, your normal regimen is likely to be disrupted due to multiple reasons following surgery.  Combination of anesthesia, postoperative narcotics, change in appetite and fluid intake all can affect your bowels.  In order to avoid complications following surgery, here are some recommendations in order to help you during your recovery period.  Colace (docusate) - Pick up an over-the-counter form of Colace or another stool softener  and take twice a day as long as you are requiring postoperative pain medications.  Take with a full glass of water daily.  If you experience loose stools or diarrhea, hold the colace until you stool forms back up.  If your symptoms do not get better within 1 week or if they get worse, check with your doctor.  Dulcolax (bisacodyl) - Pick up over-the-counter  and take as directed by the product packaging as needed to assist with the movement of your bowels.  Take with a full glass of water.  Use this product as needed if not relieved by Colace only.   MiraLax (polyethylene glycol) - Pick up over-the-counter to have on hand.  MiraLax is a solution that will increase the amount of water in your bowels to assist with bowel movements.  Take as directed and can mix with a glass of water, juice, soda, coffee, or tea.  Take if you go more than two days without a movement. Do not use MiraLax more than once per day. Call your doctor if you are still constipated or irregular after using this medication for 7 days in a row.  If you continue to have problems with postoperative constipation, please contact the office for further assistance and recommendations.  If you experience "the worst abdominal pain ever" or develop nausea or vomiting, please contact the office immediatly for further recommendations for treatment.   Take Xarelto for two and a half more weeks, then discontinue Xarelto. Once the patient has completed the blood thinner regimen, then take a Baby 81 mg Aspirin daily for three more weeks.   Do not put a pillow under the knee. Place it under the heel.    Complete by:  As directed    Do not sit on low chairs, stoools or toilet seats, as it may be difficult to get up from low surfaces    Complete by:  As directed    Driving restrictions    Complete by:  As directed    No driving until released by the physician.   Increase activity slowly as tolerated    Complete by:  As directed    Lifting restrictions    Complete by:  As directed    No lifting until released by the physician.   Patient may shower    Complete by:  As directed    You may shower without a dressing once there is no drainage.  Do not wash over the wound.  If drainage remains, do not shower until drainage stops.   TED hose    Complete by:  As directed    Use stockings (TED hose) for  3 weeks on both leg(s).  You may remove them at night for sleeping.   Weight bearing as tolerated    Complete by:  As directed    Laterality:  right   Extremity:  Lower     Allergies as of 09/19/2016      Reactions   Atorvastatin Other (See Comments)   REACTION: leg cramp      Medication List    STOP taking these medications   cephALEXin 500 MG capsule Commonly known as:  KEFLEX   ondansetron 4 MG tablet Commonly known as:  ZOFRAN   oxyCODONE-acetaminophen 5-325 MG tablet Commonly known as:  ROXICET     TAKE these medications   amLODipine 5 MG tablet Commonly known as:  NORVASC Take 5 mg by mouth daily.   escitalopram 5 MG tablet Commonly known as:  LEXAPRO Take 5 mg by mouth at bedtime.   fluticasone 50 MCG/ACT nasal spray Commonly known as:  FLONASE Place 1 spray into both nostrils daily as needed for allergies or rhinitis.   irbesartan 300 MG tablet Commonly known as:  AVAPRO Take 300 mg by mouth every morning.   methocarbamol 500 MG tablet Commonly known as:  ROBAXIN Take 1 tablet (500 mg total) by mouth every 6 (six) hours as needed for muscle spasms.   olopatadine 0.1 % ophthalmic solution Commonly known as:  PATANOL Place 1 drop into both eyes daily as needed for allergies.   oxyCODONE 5 MG immediate release tablet Commonly known as:  Oxy IR/ROXICODONE Take 1-2 tablets (5-10 mg total) by mouth every 4 (four) hours as needed for moderate pain or severe pain.   pravastatin 40 MG tablet Commonly known as:  PRAVACHOL Take 40 mg by mouth every evening.   rivaroxaban 10 MG Tabs tablet Commonly known as:  XARELTO Take 1 tablet (10 mg total) by mouth daily with breakfast. Take Xarelto for two and a half more weeks following discharge from the hospital, then discontinue Xarelto. Once the patient has completed the blood thinner regimen, then take a Baby 81 mg Aspirin daily for three more weeks. Start taking on:  09/20/2016   traMADol 50 MG tablet Commonly  known as:  ULTRAM Take 1-2 tablets (50-100 mg total) by mouth every 6 (six) hours as needed (mild pain). What changed:  when to take this  additional instructions      Follow-up Information    [facility] Follow up.   Specialty:  Home Health Services Why:  home health physical therapy Contact information: [address] [place] [address] [phone]        Gearlean Alf, MD. Schedule an appointment as soon as possible for a visit on 10/03/2016.   Specialty:  Orthopedic Surgery Contact information: [address] [place] [address] [phone]           Signed: Arlee Muslim, PA-C Orthopaedic Surgery 09/19/2016, 9:15 PM

## 2016-09-20 LAB — BASIC METABOLIC PANEL
Anion gap: 4 — ABNORMAL LOW (ref 5–15)
BUN: 17 mg/dL (ref 6–20)
CHLORIDE: 99 mmol/L — AB (ref 101–111)
CO2: 32 mmol/L (ref 22–32)
CREATININE: 0.87 mg/dL (ref 0.44–1.00)
Calcium: 10.5 mg/dL — ABNORMAL HIGH (ref 8.9–10.3)
Glucose, Bld: 129 mg/dL — ABNORMAL HIGH (ref 65–99)
Potassium: 5.2 mmol/L — ABNORMAL HIGH (ref 3.5–5.1)
SODIUM: 135 mmol/L (ref 135–145)

## 2016-09-20 LAB — CBC
HCT: 35.2 % — ABNORMAL LOW (ref 36.0–46.0)
HEMOGLOBIN: 11.1 g/dL — AB (ref 12.0–15.0)
MCH: 28.2 pg (ref 26.0–34.0)
MCHC: 31.5 g/dL (ref 30.0–36.0)
MCV: 89.3 fL (ref 78.0–100.0)
PLATELETS: 287 10*3/uL (ref 150–400)
RBC: 3.94 MIL/uL (ref 3.87–5.11)
RDW: 13.8 % (ref 11.5–15.5)
WBC: 19.5 10*3/uL — ABNORMAL HIGH (ref 4.0–10.5)

## 2016-09-20 NOTE — Progress Notes (Signed)
   Subjective: 2 Days Post-Op Procedure(s) (LRB): RIGHT TOTAL KNEE ARTHROPLASTY (Right) Patient reports pain as mild.   Patient seen in rounds with Dr. Wynelle Link. Patient is well, and has had no acute complaints or problems Patient is ready to go home  Objective: Vital signs in last 24 hours: Temp:  [98.1 F (36.7 C)-99.7 F (37.6 C)] 98.9 F (37.2 C) (02/07 0506) Pulse Rate:  [77-90] 81 (02/07 0506) Resp:  [14-16] 16 (02/07 0506) BP: (130-158)/(63-90) 130/73 (02/07 0506) SpO2:  [94 %-98 %] 97 % (02/07 0506)  Intake/Output from previous day:  Intake/Output Summary (Last 24 hours) at 09/20/16 0707 Last data filed at 09/20/16 0600  Gross per 24 hour  Intake              880 ml  Output              400 ml  Net              480 ml    Intake/Output this shift: No intake/output data recorded.  Labs:  Recent Labs  09/19/16 0449 09/20/16 0447  HGB 12.6 11.1*    Recent Labs  09/19/16 0449 09/20/16 0447  WBC 16.9* 19.5*  RBC 4.31 3.94  HCT 39.0 35.2*  PLT 269 287    Recent Labs  09/19/16 0449 09/20/16 0447  NA 137 135  K 5.0 5.2*  CL 101 99*  CO2 27 32  BUN 12 17  CREATININE 0.87 0.87  GLUCOSE 193* 129*  CALCIUM 10.0 10.5*   No results for input(s): LABPT, INR in the last 72 hours.  EXAM: General - Patient is Alert and Appropriate Extremity - Neurovascular intact Sensation intact distally Dorsiflexion/Plantar flexion intact Incision - clean, dry Motor Function - intact, moving foot and toes well on exam.   Assessment/Plan: 2 Days Post-Op Procedure(s) (LRB): RIGHT TOTAL KNEE ARTHROPLASTY (Right) Procedure(s) (LRB): RIGHT TOTAL KNEE ARTHROPLASTY (Right) Past Medical History:  Diagnosis Date  . Anxiety   . Anxiety and depression   . Arthritis   . Chronic kidney disease    Kidney stone  . Depression   . Fibromyalgia   . GERD (gastroesophageal reflux disease)   . History of kidney stones   . History of urinary tract infection   .  Hyperlipidemia   . Hypertension   . Numbness    right hand/comes and goes   . Osteoarthritis of knee    bil-gets injections  . Parathyroid tumor   . Urinary frequency   . Wears glasses    Active Problems:   OA (osteoarthritis) of knee  Estimated body mass index is 29.32 kg/m as calculated from the following:   Height as of this encounter: 5' 7.5" (1.715 m).   Weight as of this encounter: 86.2 kg (190 lb).   Discharge home - Straight to outpatinet Diet - Cardiac diet and Renal diet Follow up - in 2 weeks Activity - WBAT Disposition - Home Condition Upon Discharge - Stable D/C Meds - See DC Summary DVT Prophylaxis - Xarelto  Arlee Muslim, PA-C Orthopaedic Surgery 09/20/2016, 7:07 AM

## 2016-09-20 NOTE — Progress Notes (Signed)
PLease Note: MD has changed plan for post hospitalization to outpt PT; pt has been made aware and home health agency has been made aware.

## 2016-09-20 NOTE — Progress Notes (Signed)
Occupational Therapy Treatment Patient Details Name: Alexandra Gibson MRN: [id number] DOB: 11-Apr-1951 Today's Date: 09/20/2016    History of present illness Pt is a 66 year old s/p R TKA (hx of L THR 2016 and L TKR 2015)      Follow Up Recommendations  No OT follow up;Supervision/Assistance - 24 hour    Equipment Recommendations  None recommended by OT (Cont to assess.)    Recommendations for Other Services      Precautions / Restrictions Precautions Precautions: Knee;Fall Required Braces or Orthoses: Knee Immobilizer - Right Restrictions Weight Bearing Restrictions: No RLE Weight Bearing: Weight bearing as tolerated       Mobility Bed Mobility Overal bed mobility: Needs Assistance Bed Mobility: Supine to Sit     Supine to sit: Min assist     General bed mobility comments: increased time and encouragment  Transfers Overall transfer level: Needs assistance Equipment used: Rolling walker (2 wheeled) Transfers: Sit to/from [facility] Sit to Stand: Min guard Stand pivot transfers: Min guard       General transfer comment: verbal cues for UE and LE positioning        ADL Overall ADL's : Needs assistance/impaired     Grooming: Cueing for safety;Cueing for sequencing;Standing   Upper Body Bathing: Set up;Sitting   Lower Body Bathing: Sit to/from stand;Minimal assistance   Upper Body Dressing : Set up;Sitting   Lower Body Dressing: Minimal assistance;Sit to/from stand;Cueing for safety;Cueing for sequencing   Toilet Transfer: Min guard;RW;Ambulation   Toileting- Clothing Manipulation and Hygiene: Minimal assistance;Sit to/from stand Toileting - Clothing Manipulation Details (indicate cue type and reason): educated pt to hold walker with one hand at all times     Functional mobility during ADLs: Min guard;Rolling walker;Cueing for safety                  Cognition   Behavior During Therapy: [facility] for tasks  assessed/performed Overall Cognitive Status: Within Functional Limits for tasks assessed                               General Comments      Pertinent Vitals/ Pain       Pain Score: 5  Pain Location: R knee Pain Descriptors / Indicators: Tender;Sore Pain Intervention(s): Monitored during session;Repositioned;Limited activity within patient's tolerance         Frequency  Min 2X/week        Progress Toward Goals  OT Goals(current goals can now be found in the care plan section)  Progress towards OT goals: Progressing toward goals     Plan Discharge plan remains appropriate          Activity Tolerance Patient tolerated treatment well   Patient Left in chair;with call bell/phone within reach;with nursing/sitter in room   Nurse Communication Mobility status;Patient requests pain meds        Time: [id number] OT Time Calculation (min): 20 min  Charges: OT General Charges $OT Visit: 1 Procedure OT Treatments $Self Care/Home Management : 8-22 mins  Alila Sotero D 09/20/2016, 11:03 AM

## 2016-09-20 NOTE — Progress Notes (Signed)
Physical Therapy Treatment Patient Details Name: Alexandra Gibson MRN: [id number] DOB: [birth year] Today's Date: 09/20/2016    History of Present Illness Pt is a 66 year old s/p R TKA (hx of L THR 2016 and L TKR 2015)    PT Comments    Pt reports painful knee today however able to ambulate short distance and practice safe stair technique with spouse present.  Pt provided with stair and HEP handouts and had no further questions.  Pt agreeable to perform exercises once settled at home and pain feels better.  (RN to bring pain meds post session).   Follow Up Recommendations  Outpatient PT (pt states plan is for outpatient)     Equipment Recommendations  None recommended by PT    Recommendations for Other Services       Precautions / Restrictions Precautions Precautions: Knee;Fall Required Braces or Orthoses: Knee Immobilizer - Right Restrictions Weight Bearing Restrictions: No RLE Weight Bearing: Weight bearing as tolerated    Mobility  Bed Mobility Overal bed mobility: Needs Assistance Bed Mobility: Supine to Sit     Supine to sit: Min assist     General bed mobility comments: up in recliner on arrival  Transfers Overall transfer level: Needs assistance Equipment used: Rolling walker (2 wheeled) Transfers: Sit to/from Stand Sit to Stand: Min guard Stand pivot transfers: Min guard       General transfer comment: verbal cues for UE and LE positioning  Ambulation/Gait Ambulation/Gait assistance: Min guard Ambulation Distance (Feet): 80 Feet Assistive device: Rolling walker (2 wheeled) Gait Pattern/deviations: Decreased stance time - right;Antalgic;Step-through pattern     General Gait Details: verbal cues for RW positioning and step length   Stairs Stairs: Yes   Stair Management: Step to pattern;Backwards;With walker Number of Stairs: 2 General stair comments: pt and spouse educated on safe stair technique with cues for sequence and RW positioning, spouse  assisted with RW, provided handout as well  Wheelchair Mobility    Modified Rankin (Stroke Patients Only)       Balance                                    Cognition Arousal/Alertness: Awake/alert Behavior During Therapy: WFL for tasks assessed/performed Overall Cognitive Status: Within Functional Limits for tasks assessed                      Exercises      General Comments        Pertinent Vitals/Pain Pain Assessment: 0-10 Pain Score: 6  Pain Location: R knee Pain Descriptors / Indicators: Aching;Sore;Tightness Pain Intervention(s): Monitored during session;Limited activity within patient's tolerance;Repositioned;Patient requesting pain meds-RN notified    Home Living                      Prior Function            PT Goals (current goals can now be found in the care plan section) Progress towards PT goals: Progressing toward goals    Frequency    7X/week      PT Plan Current plan remains appropriate    Co-evaluation             End of Session Equipment Utilized During Treatment: Gait belt;Right knee immobilizer Activity Tolerance: Patient tolerated treatment well;Patient limited by pain Patient left: with call bell/phone within reach;in chair;with family/visitor present  Time: [id number] PT Time Calculation (min) (ACUTE ONLY): 24 min  Charges:  $Gait Training: 23-37 mins                    G Codes:      Nial Hawe,KATHrine E Oct 03, 2016, 12:25 PM Carmelia Bake, PT, DPT 10/03/2016 Pager: [phone]

## 2016-09-21 DIAGNOSIS — M1711 Unilateral primary osteoarthritis, right knee: Secondary | ICD-10-CM | POA: Diagnosis not present

## 2016-09-27 DIAGNOSIS — M1711 Unilateral primary osteoarthritis, right knee: Secondary | ICD-10-CM | POA: Diagnosis not present

## 2016-09-29 DIAGNOSIS — M1711 Unilateral primary osteoarthritis, right knee: Secondary | ICD-10-CM | POA: Diagnosis not present

## 2016-10-02 DIAGNOSIS — M1711 Unilateral primary osteoarthritis, right knee: Secondary | ICD-10-CM | POA: Diagnosis not present

## 2016-10-04 DIAGNOSIS — M1711 Unilateral primary osteoarthritis, right knee: Secondary | ICD-10-CM | POA: Diagnosis not present

## 2016-10-06 DIAGNOSIS — M1711 Unilateral primary osteoarthritis, right knee: Secondary | ICD-10-CM | POA: Diagnosis not present

## 2016-10-09 DIAGNOSIS — M1711 Unilateral primary osteoarthritis, right knee: Secondary | ICD-10-CM | POA: Diagnosis not present

## 2016-10-11 DIAGNOSIS — M1711 Unilateral primary osteoarthritis, right knee: Secondary | ICD-10-CM | POA: Diagnosis not present

## 2016-10-13 DIAGNOSIS — M1711 Unilateral primary osteoarthritis, right knee: Secondary | ICD-10-CM | POA: Diagnosis not present

## 2016-10-16 DIAGNOSIS — M1711 Unilateral primary osteoarthritis, right knee: Secondary | ICD-10-CM | POA: Diagnosis not present

## 2016-10-18 DIAGNOSIS — M1711 Unilateral primary osteoarthritis, right knee: Secondary | ICD-10-CM | POA: Diagnosis not present

## 2016-10-20 DIAGNOSIS — M1711 Unilateral primary osteoarthritis, right knee: Secondary | ICD-10-CM | POA: Diagnosis not present

## 2016-10-24 DIAGNOSIS — Z96651 Presence of right artificial knee joint: Secondary | ICD-10-CM | POA: Diagnosis not present

## 2016-10-24 DIAGNOSIS — Z471 Aftercare following joint replacement surgery: Secondary | ICD-10-CM | POA: Diagnosis not present

## 2016-10-24 DIAGNOSIS — M7061 Trochanteric bursitis, right hip: Secondary | ICD-10-CM | POA: Diagnosis not present

## 2016-10-25 DIAGNOSIS — M1711 Unilateral primary osteoarthritis, right knee: Secondary | ICD-10-CM | POA: Diagnosis not present

## 2016-10-27 DIAGNOSIS — M1711 Unilateral primary osteoarthritis, right knee: Secondary | ICD-10-CM | POA: Diagnosis not present

## 2016-10-30 DIAGNOSIS — M1711 Unilateral primary osteoarthritis, right knee: Secondary | ICD-10-CM | POA: Diagnosis not present

## 2016-11-01 DIAGNOSIS — M1711 Unilateral primary osteoarthritis, right knee: Secondary | ICD-10-CM | POA: Diagnosis not present

## 2016-11-03 DIAGNOSIS — M1711 Unilateral primary osteoarthritis, right knee: Secondary | ICD-10-CM | POA: Diagnosis not present

## 2016-11-06 DIAGNOSIS — M1711 Unilateral primary osteoarthritis, right knee: Secondary | ICD-10-CM | POA: Diagnosis not present

## 2016-11-08 DIAGNOSIS — M1711 Unilateral primary osteoarthritis, right knee: Secondary | ICD-10-CM | POA: Diagnosis not present

## 2016-11-13 DIAGNOSIS — M1711 Unilateral primary osteoarthritis, right knee: Secondary | ICD-10-CM | POA: Diagnosis not present

## 2016-11-15 DIAGNOSIS — M1711 Unilateral primary osteoarthritis, right knee: Secondary | ICD-10-CM | POA: Diagnosis not present

## 2016-11-17 DIAGNOSIS — M1711 Unilateral primary osteoarthritis, right knee: Secondary | ICD-10-CM | POA: Diagnosis not present

## 2016-11-20 DIAGNOSIS — M1711 Unilateral primary osteoarthritis, right knee: Secondary | ICD-10-CM | POA: Diagnosis not present

## 2016-11-22 DIAGNOSIS — M1711 Unilateral primary osteoarthritis, right knee: Secondary | ICD-10-CM | POA: Diagnosis not present

## 2016-11-24 DIAGNOSIS — M1711 Unilateral primary osteoarthritis, right knee: Secondary | ICD-10-CM | POA: Diagnosis not present

## 2016-11-27 DIAGNOSIS — M1711 Unilateral primary osteoarthritis, right knee: Secondary | ICD-10-CM | POA: Diagnosis not present

## 2016-11-29 DIAGNOSIS — M1711 Unilateral primary osteoarthritis, right knee: Secondary | ICD-10-CM | POA: Diagnosis not present

## 2016-12-01 DIAGNOSIS — M1711 Unilateral primary osteoarthritis, right knee: Secondary | ICD-10-CM | POA: Diagnosis not present

## 2016-12-04 DIAGNOSIS — M1711 Unilateral primary osteoarthritis, right knee: Secondary | ICD-10-CM | POA: Diagnosis not present

## 2017-03-07 DIAGNOSIS — M7061 Trochanteric bursitis, right hip: Secondary | ICD-10-CM | POA: Diagnosis not present

## 2017-03-07 DIAGNOSIS — M1611 Unilateral primary osteoarthritis, right hip: Secondary | ICD-10-CM | POA: Diagnosis not present

## 2017-04-10 DIAGNOSIS — Z01419 Encounter for gynecological examination (general) (routine) without abnormal findings: Secondary | ICD-10-CM | POA: Diagnosis not present

## 2017-04-10 DIAGNOSIS — N950 Postmenopausal bleeding: Secondary | ICD-10-CM | POA: Diagnosis not present

## 2017-04-10 DIAGNOSIS — Z1231 Encounter for screening mammogram for malignant neoplasm of breast: Secondary | ICD-10-CM | POA: Diagnosis not present

## 2017-04-10 DIAGNOSIS — R3915 Urgency of urination: Secondary | ICD-10-CM | POA: Diagnosis not present

## 2017-04-10 DIAGNOSIS — D251 Intramural leiomyoma of uterus: Secondary | ICD-10-CM | POA: Diagnosis not present

## 2017-04-12 DIAGNOSIS — M7061 Trochanteric bursitis, right hip: Secondary | ICD-10-CM | POA: Diagnosis not present

## 2017-04-18 DIAGNOSIS — Z0001 Encounter for general adult medical examination with abnormal findings: Secondary | ICD-10-CM | POA: Diagnosis not present

## 2017-04-18 DIAGNOSIS — E785 Hyperlipidemia, unspecified: Secondary | ICD-10-CM | POA: Diagnosis not present

## 2017-04-18 DIAGNOSIS — E210 Primary hyperparathyroidism: Secondary | ICD-10-CM | POA: Diagnosis not present

## 2017-04-18 DIAGNOSIS — I129 Hypertensive chronic kidney disease with stage 1 through stage 4 chronic kidney disease, or unspecified chronic kidney disease: Secondary | ICD-10-CM | POA: Diagnosis not present

## 2017-04-18 DIAGNOSIS — E559 Vitamin D deficiency, unspecified: Secondary | ICD-10-CM | POA: Diagnosis not present

## 2017-04-18 DIAGNOSIS — R7309 Other abnormal glucose: Secondary | ICD-10-CM | POA: Diagnosis not present

## 2017-04-25 DIAGNOSIS — Z23 Encounter for immunization: Secondary | ICD-10-CM | POA: Diagnosis not present

## 2017-04-25 DIAGNOSIS — E559 Vitamin D deficiency, unspecified: Secondary | ICD-10-CM | POA: Diagnosis not present

## 2017-04-25 DIAGNOSIS — I10 Essential (primary) hypertension: Secondary | ICD-10-CM | POA: Diagnosis not present

## 2017-05-10 DIAGNOSIS — M1611 Unilateral primary osteoarthritis, right hip: Secondary | ICD-10-CM | POA: Diagnosis not present

## 2017-05-14 DIAGNOSIS — D2230 Melanocytic nevi of unspecified part of face: Secondary | ICD-10-CM | POA: Diagnosis not present

## 2017-05-14 DIAGNOSIS — F329 Major depressive disorder, single episode, unspecified: Secondary | ICD-10-CM | POA: Diagnosis not present

## 2017-05-30 ENCOUNTER — Other Ambulatory Visit: Payer: Self-pay | Admitting: Endocrinology

## 2017-05-30 DIAGNOSIS — E210 Primary hyperparathyroidism: Secondary | ICD-10-CM | POA: Diagnosis not present

## 2017-05-30 DIAGNOSIS — I129 Hypertensive chronic kidney disease with stage 1 through stage 4 chronic kidney disease, or unspecified chronic kidney disease: Secondary | ICD-10-CM | POA: Diagnosis not present

## 2017-05-30 DIAGNOSIS — E213 Hyperparathyroidism, unspecified: Secondary | ICD-10-CM

## 2017-05-30 DIAGNOSIS — E559 Vitamin D deficiency, unspecified: Secondary | ICD-10-CM | POA: Diagnosis not present

## 2017-05-31 ENCOUNTER — Other Ambulatory Visit (HOSPITAL_COMMUNITY): Payer: Self-pay | Admitting: Endocrinology

## 2017-05-31 DIAGNOSIS — E210 Primary hyperparathyroidism: Secondary | ICD-10-CM

## 2017-06-07 ENCOUNTER — Encounter (HOSPITAL_COMMUNITY)
Admission: RE | Admit: 2017-06-07 | Discharge: 2017-06-07 | Disposition: A | Discharge status: Home / Self Care | Payer: Federal, State, Local not specified - PPO | Source: Ambulatory Visit | Attending: Endocrinology | Admitting: Endocrinology

## 2017-06-07 ENCOUNTER — Encounter (HOSPITAL_COMMUNITY): Payer: Federal, State, Local not specified - PPO

## 2017-06-07 DIAGNOSIS — E210 Primary hyperparathyroidism: Secondary | ICD-10-CM | POA: Diagnosis not present

## 2017-06-07 MED ORDER — TECHNETIUM TC 99M SESTAMIBI - CARDIOLITE
26.5000 | Freq: Once | INTRAVENOUS | Status: AC | PRN
  Administered 2017-06-07: 09:00:00 26.5 via INTRAVENOUS

## 2017-06-18 ENCOUNTER — Ambulatory Visit
Admission: RE | Admit: 2017-06-18 | Discharge: 2017-06-18 | Disposition: A | Discharge status: Home / Self Care | Payer: Federal, State, Local not specified - PPO | Source: Ambulatory Visit | Attending: Endocrinology | Admitting: Endocrinology

## 2017-06-18 DIAGNOSIS — E213 Hyperparathyroidism, unspecified: Secondary | ICD-10-CM

## 2017-06-18 DIAGNOSIS — Z780 Asymptomatic menopausal state: Secondary | ICD-10-CM | POA: Diagnosis not present

## 2017-06-18 DIAGNOSIS — E559 Vitamin D deficiency, unspecified: Secondary | ICD-10-CM

## 2017-10-07 ENCOUNTER — Ambulatory Visit: Payer: Self-pay | Admitting: Orthopedic Surgery

## 2017-10-18 DIAGNOSIS — R7309 Other abnormal glucose: Secondary | ICD-10-CM | POA: Diagnosis not present

## 2017-10-18 DIAGNOSIS — I10 Essential (primary) hypertension: Secondary | ICD-10-CM | POA: Diagnosis not present

## 2017-10-23 DIAGNOSIS — E7800 Pure hypercholesterolemia, unspecified: Secondary | ICD-10-CM | POA: Diagnosis not present

## 2017-10-23 DIAGNOSIS — I10 Essential (primary) hypertension: Secondary | ICD-10-CM | POA: Diagnosis not present

## 2017-10-23 DIAGNOSIS — Z01818 Encounter for other preprocedural examination: Secondary | ICD-10-CM | POA: Diagnosis not present

## 2017-10-23 DIAGNOSIS — K219 Gastro-esophageal reflux disease without esophagitis: Secondary | ICD-10-CM | POA: Diagnosis not present

## 2017-10-23 DIAGNOSIS — R7309 Other abnormal glucose: Secondary | ICD-10-CM | POA: Diagnosis not present

## 2017-10-23 DIAGNOSIS — F419 Anxiety disorder, unspecified: Secondary | ICD-10-CM | POA: Diagnosis not present

## 2017-10-29 ENCOUNTER — Other Ambulatory Visit (HOSPITAL_COMMUNITY): Payer: Self-pay | Admitting: Emergency Medicine

## 2017-10-29 ENCOUNTER — Ambulatory Visit: Payer: Self-pay | Admitting: Orthopedic Surgery

## 2017-10-29 ENCOUNTER — Encounter (HOSPITAL_COMMUNITY): Payer: Self-pay

## 2017-10-29 NOTE — H&P (Signed)
Name Alexandra Gibson, Alexandra Gibson (81NF, F) DOB [birth year]    Chief Complaint Right Hip Pain   Patient's Care Team Primary Care Provider: Thayer Jew MD: [address] [place] [facility], [place] [address], Ph [phone], Fax [phone] NPI: [id number]  Patient's Pharmacies [place] [address] [facility]): [place], [place] [address], Ph [phone], Fax [phone]   Vitals Ht: 5 ft 8 in  Wt: 190 lbs  BMI: 28.9  BP: 118/68  Pulse: 64 bpm    Allergies Reviewed Allergies STATINS-HMG-COA REDUCTASE INHIBITORS    Medications Reviewed Medications A and D 10/29/17   entered Kaidon Kinker, PA-C amLODIPine 5 mg tablet 07/23/17   filled [facility] celecoxib 200 mg capsule 09/22/17   filled [place] cod liver oil 10/29/17   entered Justun Anaya, PA-C escitalopram 10 mg tablet 08/22/17   filled [facility] irbesartan 300 mg tablet 10/08/17   filled [facility] pantoprazole 40 mg tablet,delayed release 01/18/17   filled [facility] pravastatin 40 mg tablet 10/08/17   filled [facility] tolterodine ER 4 mg capsule,extended release 24 hr 08/25/17   filled [facility] traMADol 50 mg tablet 09/22/16   filled [facility]             Problems Reviewed Problems Osteoarthritis of right hip joint    Family History Reviewed Family History Father - Heart disease Mother - Arthritis   - Hypertensive disorder   - Spinal stenosis   Social History Reviewed Social History Smoking Status: Never smoker Alcohol intake: None Hand Dominance: Right Work related injury?: N Advance directive: N Medical Power of Attorney: N   Surgical History Reviewed Surgical History Appendectomy - 1971 Hip Joint Replacement - Left Hip - Dec 2016 Knee Joint Replacement - Left Knee - Nov 2015 Knee Joint Replacement - Right Knee - Feb 2018  Kidney Stone Surgery - March 2017   Past Medical History Reviewed Past Medical History Anxiety Disorder: Y Fibromyalgia: Y High  Cholesterol: Y Hypertension: Y Joint Pain: Y Osteoarthritis: Y Previous Cortisone Injection(s): Y Notes: Kidney Stones,  Hypercalcemia,  Menopausal    HPI Patient is scheduled for Right Total Hip on 11/07/2017 by Dr. Wynelle Link at [facility]. The patient is a 67 year old female who is being followed for their right hip pain, trochanteric bursitis and osteoarthritis. Symptoms reported today include: pain, aching and difficulty ambulating (difficulty flexing hip). The patient did report temporary improvement of their symptoms with: Cortisone injections. She states that the intra-articular injection helped some, but it has already worn off. Unfortunately her RIGHT hip is getting a lot worse. It hurts as bad as her LEFT side did prior to the time she had the LEFT one replaced. She has had both knees and LEFT hip done. The RIGHT hip is definitely altered her ability to do things she desires. AP pelvis and lateral of the RIGHT hip show bone-on-bone arthritis in that hip. There is subchondral cystic formation. She has advanced end-stage arthritis in that RIGHT hip. She is ready to go ahead and get it fixed. Risks and benefits have been discussed with the patient and she elects to proceed with surgery.   ROS Constitutional: Constitutional: no significant weight gain or loss and no fever.  HEENT: Eyes: no irritation, dry eyes, or sore throat and vision change.  Cardiovascular: Cardiovascular: no palpitations or chest pain; positive for high blood pressure.  Respiratory: Respiratory: no cough or shortness of breath and No COPD; positive for snoring.  Gastrointestinal: Gastrointestinal: no vomiting or diarrhea and not vomiting  blood.  Genitourinary: Genitourinary: no blood in urine or difficulty urinating; some urinary frequency.  Musculoskeletal: Musculoskeletal: no swelling in Joints and Joint Pain; positive for morning stiffness.  Integumentary: Skin: no rashes or varicose  veins.  Neurologic: Neurologic: no numbness, seizures, dizziness, or difficulty with balance.  Psychiatric: Psych: anxiety.    Physical Exam Patient is a 67 year old female.  General Mental Status - Alert, cooperative and good historian. General Appearance - pleasant, Not in acute distress. Orientation - Oriented X3. Build & Nutrition - Well nourished and Well developed.  Head and Neck Head - normocephalic, atraumatic . Partial lower denture Neck Global Assessment - supple, no bruit auscultated on the right, no bruit auscultated on the left.  Eye Wears glasses (part of the time) Pupil - Bilateral - PERR Motion - Bilateral - EOMI.  Chest and Lung Exam Auscultation Breath sounds - clear at anterior chest wall and clear at posterior chest wall. Adventitious sounds - No Adventitious sounds.  Cardiovascular Auscultation Rhythm - Regular rate and rhythm. Heart Sounds - S1 WNL and S2 WNL. Murmurs & Other Heart Sounds - Auscultation of the heart reveals - No Murmurs.  Abdomen Palpation/Percussion Tenderness - Abdomen is non-tender to palpation. Abdomen is soft. Auscultation Auscultation of the abdomen reveals - Bowel sounds normal.  Genitourinary Note: Not done, not pertinent to present illness  Musculoskeletal RIGHT hip can be flexed to 100 with no internal rotation, 10 of external rotation and 10 of abduction. She has a significant antalgic gait pattern on the RIGHT.  Her radiographs AP pelvis and lateral of the RIGHT hip show bone-on-bone arthritis in that hip. There is subchondral cystic formation.   Assessment / Plan 1. Osteoarthritis of right hip joint M16.11: Unilateral primary osteoarthritis, right hip  Patient Instructions Surgical Plans: Right Total Hip Replacement - Anterior Approach Disposition: Home, HHPT PCP: Dr. Thressa Sheller - pending IV TXA Anesthesia Issues: None Patient was instructed on what medications to stop prior to surgery. - Follow  up visit in 2 weeks with Dr. Wynelle Link - Begin physical therapy following surgery - Pre-operative lab work as pre Pre-Surgical Testing - Prescriptions will be provided in hospital at time of discharge  Return to [place], MD for [facility] on 11/20/2017 at 01:45 PM  Encounter signed-off by Mickel Crow, PA-C

## 2017-10-29 NOTE — Progress Notes (Signed)
Surgical clearance on chart 08-25-17    EKG 08-25-17 on chart from [place] dr Thayer Jew pharr 10-23-17 epic   hgba1c 10-23-17, CMP, lipid panel 10-18-17 on chart from [place]

## 2017-10-29 NOTE — H&P (View-Only) (Signed)
Name Alexandra Gibson, Alexandra Gibson (47NI, F) DOB 08/12/1951    Chief Complaint Right Hip Pain   Patient's Care Team Primary Care Provider: Thayer Jew MD: [address] [place], [place] [address], Ph [phone], Fax [phone] NPI: [id number]  Patient's Pharmacies [place] [address] [facility]): [place], [place] [address], Ph [phone], Fax [phone]   Vitals Ht: 5 ft 8 in  Wt: 190 lbs  BMI: 28.9  BP: 118/68  Pulse: 64 bpm    Allergies Reviewed Allergies STATINS-HMG-COA REDUCTASE INHIBITORS    Medications Reviewed Medications A and D 10/29/17   entered Blenda Wisecup, PA-C amLODIPine 5 mg tablet 07/23/17   filled [facility] celecoxib 200 mg capsule 09/22/17   filled [facility] cod liver oil 10/29/17   entered Riona Lahti, PA-C escitalopram 10 mg tablet 08/22/17   filled [facility] irbesartan 300 mg tablet 10/08/17   filled [facility] pantoprazole 40 mg tablet,delayed release 01/18/17   filled [facility] pravastatin 40 mg tablet 10/08/17   filled [facility] tolterodine ER 4 mg capsule,extended release 24 hr 08/25/17   filled [facility] traMADol 50 mg tablet 09/22/16   filled [facility]             Problems Reviewed Problems Osteoarthritis of right hip joint    Family History Reviewed Family History Father - Heart disease Mother - Arthritis   - Hypertensive disorder   - Spinal stenosis   Social History Reviewed Social History Smoking Status: Never smoker Alcohol intake: None Hand Dominance: Right Work related injury?: N Advance directive: N Medical Power of Attorney: N   Surgical History Reviewed Surgical History Appendectomy - 1971 Hip Joint Replacement - Left Hip - Dec 2016 Knee Joint Replacement - Left Knee - Nov 2015 Knee Joint Replacement - Right Knee - Feb 2018  Kidney Stone Surgery - March 2017   Past Medical History Reviewed Past Medical History Anxiety Disorder: Y Fibromyalgia: Y High  Cholesterol: Y Hypertension: Y Joint Pain: Y Osteoarthritis: Y Previous Cortisone Injection(s): Y Notes: Kidney Stones,  Hypercalcemia,  Menopausal    HPI Patient is scheduled for Right Total Hip on 11/07/2017 by Dr. Wynelle Link at [facility]. The patient is a 67 year old female who is being followed for their right hip pain, trochanteric bursitis and osteoarthritis. Symptoms reported today include: pain, aching and difficulty ambulating (difficulty flexing hip). The patient did report temporary improvement of their symptoms with: Cortisone injections. She states that the intra-articular injection helped some, but it has already worn off. Unfortunately her RIGHT hip is getting a lot worse. It hurts as bad as her LEFT side did prior to the time she had the LEFT one replaced. She has had both knees and LEFT hip done. The RIGHT hip is definitely altered her ability to do things she desires. AP pelvis and lateral of the RIGHT hip show bone-on-bone arthritis in that hip. There is subchondral cystic formation. She has advanced end-stage arthritis in that RIGHT hip. She is ready to go ahead and get it fixed. Risks and benefits have been discussed with the patient and she elects to proceed with surgery.   ROS Constitutional: Constitutional: no significant weight gain or loss and no fever.  HEENT: Eyes: no irritation, dry eyes, or sore throat and vision change.  Cardiovascular: Cardiovascular: no palpitations or chest pain; positive for high blood pressure.  Respiratory: Respiratory: no cough or shortness of breath and No COPD; positive for snoring.  Gastrointestinal: Gastrointestinal: no vomiting or diarrhea and not vomiting  blood.  Genitourinary: Genitourinary: no blood in urine or difficulty urinating; some urinary frequency.  Musculoskeletal: Musculoskeletal: no swelling in Joints and Joint Pain; positive for morning stiffness.  Integumentary: Skin: no rashes or varicose  veins.  Neurologic: Neurologic: no numbness, seizures, dizziness, or difficulty with balance.  Psychiatric: Psych: anxiety.    Physical Exam Patient is a 67 year old female.  General Mental Status - Alert, cooperative and good historian. General Appearance - pleasant, Not in acute distress. Orientation - Oriented X3. Build & Nutrition - Well nourished and Well developed.  Head and Neck Head - normocephalic, atraumatic . Partial lower denture Neck Global Assessment - supple, no bruit auscultated on the right, no bruit auscultated on the left.  Eye Wears glasses (part of the time) Pupil - Bilateral - PERR Motion - Bilateral - EOMI.  Chest and Lung Exam Auscultation Breath sounds - clear at anterior chest wall and clear at posterior chest wall. Adventitious sounds - No Adventitious sounds.  Cardiovascular Auscultation Rhythm - Regular rate and rhythm. Heart Sounds - S1 WNL and S2 WNL. Murmurs & Other Heart Sounds - Auscultation of the heart reveals - No Murmurs.  Abdomen Palpation/Percussion Tenderness - Abdomen is non-tender to palpation. Abdomen is soft. Auscultation Auscultation of the abdomen reveals - Bowel sounds normal.  Genitourinary Note: Not done, not pertinent to present illness  Musculoskeletal RIGHT hip can be flexed to 100 with no internal rotation, 10 of external rotation and 10 of abduction. She has a significant antalgic gait pattern on the RIGHT.  Her radiographs AP pelvis and lateral of the RIGHT hip show bone-on-bone arthritis in that hip. There is subchondral cystic formation.   Assessment / Plan 1. Osteoarthritis of right hip joint M16.11: Unilateral primary osteoarthritis, right hip  Patient Instructions Surgical Plans: Right Total Hip Replacement - Anterior Approach Disposition: Home, HHPT PCP: Dr. Thressa Sheller - pending IV TXA Anesthesia Issues: None Patient was instructed on what medications to stop prior to surgery. - Follow  up visit in 2 weeks with Dr. Wynelle Link - Begin physical therapy following surgery - Pre-operative lab work as pre Pre-Surgical Testing - Prescriptions will be provided in hospital at time of discharge  Return to [place], MD for [facility] on 11/20/2017 at 01:45 PM  Encounter signed-off by Mickel Crow, PA-C

## 2017-10-29 NOTE — Patient Instructions (Signed)
Alexandra Gibson  10/29/2017   Your procedure is scheduled on: 11-07-17  Report to [facility] Main  Entrance   ARRIVE AT 54 AM. Have a seat in the Main Lobby. Please note there is a phone at the [employer]. Please call [phone] on that phone. Someone from Short Stay will come and get you from the Main Lobby and take you to Short Stay.   Call this number if you have problems the morning of surgery [phone]     Remember: Do not eat food or drink liquids :After Midnight.     Take these medicines the morning of surgery with A SIP OF WATER: amlodipine, eye drops, nasal spray if needed                                You may not have any metal on your body including hair pins and              piercings  Do not wear jewelry, make-up, lotions, powders or perfumes, deodorant             Do not wear nail polish.  Do not shave  48 hours prior to surgery.                Do not bring valuables to the hospital. [place].  Contacts, dentures or bridgework may not be worn into surgery.  Leave suitcase in the car. After surgery it may be brought to your room.               Please read over the following fact sheets you were given: _____________________________________________________________________             [facility] - Preparing for Surgery Before surgery, you can play an important role.  Because skin is not sterile, your skin needs to be as free of germs as possible.  You can reduce the number of germs on your skin by washing with CHG (chlorahexidine gluconate) soap before surgery.  CHG is an antiseptic cleaner which kills germs and bonds with the skin to continue killing germs even after washing. Please DO NOT use if you have an allergy to CHG or antibacterial soaps.  If your skin becomes reddened/irritated stop using the CHG and inform your nurse when you arrive at Short Stay. Do not shave  (including legs and underarms) for at least 48 hours prior to the first CHG shower.  You may shave your face/neck. Please follow these instructions carefully:  1.  Shower with CHG Soap the night before surgery and the  morning of Surgery.  2.  If you choose to wash your hair, wash your hair first as usual with your  normal  shampoo.  3.  After you shampoo, rinse your hair and body thoroughly to remove the  shampoo.                           4.  Use CHG as you would any other liquid soap.  You can apply chg directly  to the skin and wash  Gently with a scrungie or clean washcloth.  5.  Apply the CHG Soap to your body ONLY FROM THE NECK DOWN.   Do not use on face/ open                           Wound or open sores. Avoid contact with eyes, ears mouth and genitals (private parts).                       Wash face,  Genitals (private parts) with your normal soap.             6.  Wash thoroughly, paying special attention to the area where your surgery  will be performed.  7.  Thoroughly rinse your body with warm water from the neck down.  8.  DO NOT shower/wash with your normal soap after using and rinsing off  the CHG Soap.                9.  Pat yourself dry with a clean towel.            10.  Wear clean pajamas.            11.  Place clean sheets on your bed the night of your first shower and do not  sleep with pets. Day of Surgery : Do not apply any lotions/deodorants the morning of surgery.  Please wear clean clothes to the hospital/surgery center.  FAILURE TO FOLLOW THESE INSTRUCTIONS MAY RESULT IN THE CANCELLATION OF YOUR SURGERY PATIENT SIGNATURE_________________________________  NURSE SIGNATURE__________________________________  ________________________________________________________________________   Adam Phenix  An incentive spirometer is a tool that can help keep your lungs clear and active. This tool measures how well you are filling your lungs with  each breath. Taking long deep breaths may help reverse or decrease the chance of developing breathing (pulmonary) problems (especially infection) following:  A long period of time when you are unable to move or be active. BEFORE THE PROCEDURE   If the spirometer includes an indicator to show your best effort, your nurse or respiratory therapist will set it to a desired goal.  If possible, sit up straight or lean slightly forward. Try not to slouch.  Hold the incentive spirometer in an upright position. INSTRUCTIONS FOR USE  1. Sit on the edge of your bed if possible, or sit up as far as you can in bed or on a chair. 2. Hold the incentive spirometer in an upright position. 3. Breathe out normally. 4. Place the mouthpiece in your mouth and seal your lips tightly around it. 5. Breathe in slowly and as deeply as possible, raising the piston or the ball toward the top of the column. 6. Hold your breath for 3-5 seconds or for as long as possible. Allow the piston or ball to fall to the bottom of the column. 7. Remove the mouthpiece from your mouth and breathe out normally. 8. Rest for a few seconds and repeat Steps 1 through 7 at least 10 times every 1-2 hours when you are awake. Take your time and take a few normal breaths between deep breaths. 9. The spirometer may include an indicator to show your best effort. Use the indicator as a goal to work toward during each repetition. 10. After each set of 10 deep breaths, practice coughing to be sure your lungs are clear. If you have an incision (the cut made at the time of surgery),  support your incision when coughing by placing a pillow or rolled up towels firmly against it. Once you are able to get out of bed, walk around indoors and cough well. You may stop using the incentive spirometer when instructed by your caregiver.  RISKS AND COMPLICATIONS  Take your time so you do not get dizzy or light-headed.  If you are in pain, you may need to take or  ask for pain medication before doing incentive spirometry. It is harder to take a deep breath if you are having pain. AFTER USE  Rest and breathe slowly and easily.  It can be helpful to keep track of a log of your progress. Your caregiver can provide you with a simple table to help with this. If you are using the spirometer at home, follow these instructions: [place] IF:   You are having difficultly using the spirometer.  You have trouble using the spirometer as often as instructed.  Your pain medication is not giving enough relief while using the spirometer.  You develop fever of 100.5 F (38.1 C) or higher. SEEK IMMEDIATE MEDICAL CARE IF:   You cough up bloody sputum that had not been present before.  You develop fever of 102 F (38.9 C) or greater.  You develop worsening pain at or near the incision site. MAKE SURE YOU:   Understand these instructions.  Will watch your condition.  Will get help right away if you are not doing well or get worse. Document Released: 12/11/2006 Document Revised: 10/23/2011 Document Reviewed: 02/11/2007 ExitCare Patient Information 2014 ExitCare, [place].   ________________________________________________________________________  WHAT IS A BLOOD TRANSFUSION? Blood Transfusion Information  A transfusion is the replacement of blood or some of its parts. Blood is made up of multiple cells which provide different functions.  Red blood cells carry oxygen and are used for blood loss replacement.  White blood cells fight against infection.  Platelets control bleeding.  Plasma helps clot blood.  Other blood products are available for specialized needs, such as hemophilia or other clotting disorders. BEFORE THE TRANSFUSION  Who gives blood for transfusions?   Healthy volunteers who are fully evaluated to make sure their blood is safe. This is blood bank blood. Transfusion therapy is the safest it has ever been in the practice of  medicine. Before blood is taken from a donor, a complete history is taken to make sure that person has no history of diseases nor engages in risky social behavior (examples are intravenous drug use or sexual activity with multiple partners). The donor's travel history is screened to minimize risk of transmitting infections, such as malaria. The donated blood is tested for signs of infectious diseases, such as HIV and hepatitis. The blood is then tested to be sure it is compatible with you in order to minimize the chance of a transfusion reaction. If you or a relative donates blood, this is often done in anticipation of surgery and is not appropriate for emergency situations. It takes many days to process the donated blood. RISKS AND COMPLICATIONS Although transfusion therapy is very safe and saves many lives, the main dangers of transfusion include:   Getting an infectious disease.  Developing a transfusion reaction. This is an allergic reaction to something in the blood you were given. Every precaution is taken to prevent this. The decision to have a blood transfusion has been considered carefully by your caregiver before blood is given. Blood is not given unless the benefits outweigh the risks. AFTER THE TRANSFUSION  Right after receiving a blood transfusion, you will usually feel much better and more energetic. This is especially true if your red blood cells have gotten low (anemic). The transfusion raises the level of the red blood cells which carry oxygen, and this usually causes an energy increase.  The nurse administering the transfusion will monitor you carefully for complications. HOME CARE INSTRUCTIONS  No special instructions are needed after a transfusion. You may find your energy is better. Speak with your caregiver about any limitations on activity for underlying diseases you may have. SEEK MEDICAL CARE IF:   Your condition is not improving after your transfusion.  You develop  redness or irritation at the intravenous (IV) site. SEEK IMMEDIATE MEDICAL CARE IF:  Any of the following symptoms occur over the next 12 hours:  Shaking chills.  You have a temperature by mouth above 102 F (38.9 C), not controlled by medicine.  Chest, back, or muscle pain.  People around you feel you are not acting correctly or are confused.  Shortness of breath or difficulty breathing.  Dizziness and fainting.  You get a rash or develop hives.  You have a decrease in urine output.  Your urine turns a dark color or changes to pink, red, or brown. Any of the following symptoms occur over the next 10 days:  You have a temperature by mouth above 102 F (38.9 C), not controlled by medicine.  Shortness of breath.  Weakness after normal activity.  The white part of the eye turns yellow (jaundice).  You have a decrease in the amount of urine or are urinating less often.  Your urine turns a dark color or changes to pink, red, or brown. Document Released: 07/28/2000 Document Revised: 10/23/2011 Document Reviewed: 03/16/2008 [facility] Patient Information 2014 [place], [place].  _______________________________________________________________________

## 2017-10-30 ENCOUNTER — Encounter (HOSPITAL_COMMUNITY): Payer: Self-pay

## 2017-10-30 ENCOUNTER — Encounter (HOSPITAL_COMMUNITY)
Admission: RE | Admit: 2017-10-30 | Discharge: 2017-10-30 | Disposition: A | Discharge status: Home / Self Care | Payer: Federal, State, Local not specified - PPO | Source: Ambulatory Visit | Attending: Orthopedic Surgery | Admitting: Orthopedic Surgery

## 2017-10-30 ENCOUNTER — Other Ambulatory Visit: Payer: Self-pay

## 2017-10-30 DIAGNOSIS — Z01812 Encounter for preprocedural laboratory examination: Secondary | ICD-10-CM | POA: Diagnosis not present

## 2017-10-30 DIAGNOSIS — M1611 Unilateral primary osteoarthritis, right hip: Secondary | ICD-10-CM | POA: Diagnosis not present

## 2017-10-30 HISTORY — DX: Prediabetes: R73.03

## 2017-10-30 HISTORY — DX: Calculus of kidney: N20.0

## 2017-10-30 LAB — CBC
HEMATOCRIT: 46.4 % — AB (ref 36.0–46.0)
Hemoglobin: 14.5 g/dL (ref 12.0–15.0)
MCH: 29.2 pg (ref 26.0–34.0)
MCHC: 31.3 g/dL (ref 30.0–36.0)
MCV: 93.5 fL (ref 78.0–100.0)
Platelets: 298 10*3/uL (ref 150–400)
RBC: 4.96 MIL/uL (ref 3.87–5.11)
RDW: 13.8 % (ref 11.5–15.5)
WBC: 8 10*3/uL (ref 4.0–10.5)

## 2017-10-30 LAB — SURGICAL PCR SCREEN
MRSA, PCR: NEGATIVE
Staphylococcus aureus: NEGATIVE

## 2017-10-30 LAB — PROTIME-INR
INR: 1.06
Prothrombin Time: 13.7 seconds (ref 11.4–15.2)

## 2017-10-30 LAB — APTT: aPTT: 31 seconds (ref 24–36)

## 2017-11-06 ENCOUNTER — Encounter (HOSPITAL_COMMUNITY): Payer: Self-pay | Admitting: Anesthesiology

## 2017-11-06 ENCOUNTER — Ambulatory Visit: Payer: Self-pay | Admitting: Orthopedic Surgery

## 2017-11-06 MED ORDER — TRANEXAMIC ACID 1000 MG/10ML IV SOLN
1000.0000 mg | INTRAVENOUS | Status: AC
  Administered 2017-11-07: 1000 mg via INTRAVENOUS
  Filled 2017-11-06: qty 1100

## 2017-11-06 NOTE — Anesthesia Preprocedure Evaluation (Addendum)
Anesthesia Evaluation  Patient identified by MRN, date of birth, ID band Patient awake    Reviewed: Allergy & Precautions, NPO status , Patient's Chart, lab work & pertinent test results, reviewed documented beta blocker date and time   Airway Mallampati: II  TM Distance: >3 FB Neck ROM: Full    Dental no notable dental hx. (+) Partial Lower, Dental Advisory Given   Pulmonary neg pulmonary ROS,    Pulmonary exam normal breath sounds clear to auscultation       Cardiovascular hypertension, Pt. on medications Normal cardiovascular exam+ dysrhythmias  Rhythm:Regular Rate:Normal     Neuro/Psych PSYCHIATRIC DISORDERS Anxiety Depression  Neuromuscular disease    GI/Hepatic Neg liver ROS, GERD  Medicated and Controlled,  Endo/Other  Hyperlipidemia  Renal/GU Renal diseaseHx/o renal calculus  negative genitourinary   Musculoskeletal  (+) Arthritis , Osteoarthritis,  Fibromyalgia -OA left hip   Abdominal (+) + obese,   Peds  Hematology negative hematology ROS (+)   Anesthesia Other Findings   Reproductive/Obstetrics                            Anesthesia Physical Anesthesia Plan  ASA: II  Anesthesia Plan: Spinal   Post-op Pain Management:    Induction:   PONV Risk Score and Plan: 3 and Propofol infusion, Dexamethasone, Ondansetron and Treatment may vary due to age or medical condition  Airway Management Planned: Natural Airway and Simple Face Mask  Additional Equipment:   Intra-op Plan:   Post-operative Plan:   Informed Consent: I have reviewed the patients History and Physical, chart, labs and discussed the procedure including the risks, benefits and alternatives for the proposed anesthesia with the patient or authorized representative who has indicated his/her understanding and acceptance.   Dental advisory given  Plan Discussed with: Anesthesiologist, Surgeon and CRNA  Anesthesia  Plan Comments:        Anesthesia Quick Evaluation

## 2017-11-07 ENCOUNTER — Inpatient Hospital Stay (HOSPITAL_COMMUNITY)
Admission: RE | Admit: 2017-11-07 | Discharge: 2017-11-08 | DRG: 470 | Disposition: A | Discharge status: Home Health | Payer: Federal, State, Local not specified - PPO | Source: Ambulatory Visit | Attending: Orthopedic Surgery | Admitting: Orthopedic Surgery

## 2017-11-07 ENCOUNTER — Encounter (HOSPITAL_COMMUNITY)
Admission: RE | Disposition: A | Discharge status: Home Health | Payer: Self-pay | Source: Ambulatory Visit | Attending: Orthopedic Surgery

## 2017-11-07 ENCOUNTER — Inpatient Hospital Stay (HOSPITAL_COMMUNITY): Payer: Federal, State, Local not specified - PPO

## 2017-11-07 ENCOUNTER — Encounter (HOSPITAL_COMMUNITY): Payer: Self-pay | Admitting: *Deleted

## 2017-11-07 ENCOUNTER — Other Ambulatory Visit: Payer: Self-pay

## 2017-11-07 ENCOUNTER — Inpatient Hospital Stay (HOSPITAL_COMMUNITY): Payer: Federal, State, Local not specified - PPO | Admitting: Anesthesiology

## 2017-11-07 DIAGNOSIS — M1611 Unilateral primary osteoarthritis, right hip: Secondary | ICD-10-CM | POA: Diagnosis not present

## 2017-11-07 DIAGNOSIS — E7800 Pure hypercholesterolemia, unspecified: Secondary | ICD-10-CM | POA: Diagnosis present

## 2017-11-07 DIAGNOSIS — I10 Essential (primary) hypertension: Secondary | ICD-10-CM | POA: Diagnosis present

## 2017-11-07 DIAGNOSIS — Z471 Aftercare following joint replacement surgery: Secondary | ICD-10-CM | POA: Diagnosis not present

## 2017-11-07 DIAGNOSIS — Z96649 Presence of unspecified artificial hip joint: Secondary | ICD-10-CM

## 2017-11-07 DIAGNOSIS — M25551 Pain in right hip: Secondary | ICD-10-CM | POA: Diagnosis not present

## 2017-11-07 DIAGNOSIS — F419 Anxiety disorder, unspecified: Secondary | ICD-10-CM | POA: Diagnosis present

## 2017-11-07 DIAGNOSIS — Z79899 Other long term (current) drug therapy: Secondary | ICD-10-CM

## 2017-11-07 DIAGNOSIS — Z96653 Presence of artificial knee joint, bilateral: Secondary | ICD-10-CM | POA: Diagnosis present

## 2017-11-07 DIAGNOSIS — E669 Obesity, unspecified: Secondary | ICD-10-CM | POA: Diagnosis present

## 2017-11-07 DIAGNOSIS — M797 Fibromyalgia: Secondary | ICD-10-CM | POA: Diagnosis present

## 2017-11-07 DIAGNOSIS — F329 Major depressive disorder, single episode, unspecified: Secondary | ICD-10-CM | POA: Diagnosis present

## 2017-11-07 DIAGNOSIS — Z6828 Body mass index (BMI) 28.0-28.9, adult: Secondary | ICD-10-CM

## 2017-11-07 DIAGNOSIS — K219 Gastro-esophageal reflux disease without esophagitis: Secondary | ICD-10-CM | POA: Diagnosis present

## 2017-11-07 DIAGNOSIS — M169 Osteoarthritis of hip, unspecified: Secondary | ICD-10-CM | POA: Diagnosis present

## 2017-11-07 DIAGNOSIS — Z96641 Presence of right artificial hip joint: Secondary | ICD-10-CM | POA: Diagnosis not present

## 2017-11-07 HISTORY — PX: TOTAL HIP ARTHROPLASTY: SHX124

## 2017-11-07 LAB — TYPE AND SCREEN
ABO/RH(D): O POS
Antibody Screen: NEGATIVE

## 2017-11-07 SURGERY — ARTHROPLASTY, HIP, TOTAL, ANTERIOR APPROACH
Anesthesia: General | Site: Hip | Laterality: Right

## 2017-11-07 MED ORDER — ACETAMINOPHEN 10 MG/ML IV SOLN
1000.0000 mg | Freq: Once | INTRAVENOUS | Status: AC
  Administered 2017-11-07: 1000 mg via INTRAVENOUS
  Filled 2017-11-07: qty 100

## 2017-11-07 MED ORDER — STERILE WATER FOR IRRIGATION IR SOLN
Status: DC | PRN
  Administered 2017-11-07: 2000 mL

## 2017-11-07 MED ORDER — LIDOCAINE 2% (20 MG/ML) 5 ML SYRINGE
INTRAMUSCULAR | Status: AC
  Filled 2017-11-07: qty 5

## 2017-11-07 MED ORDER — TRAMADOL HCL 50 MG PO TABS: 50.0000 mg | ORAL_TABLET | Freq: Four times a day (QID) | ORAL | Status: DC | PRN

## 2017-11-07 MED ORDER — METOCLOPRAMIDE HCL 5 MG/ML IJ SOLN: 5.0000 mg | Freq: Three times a day (TID) | INTRAMUSCULAR | Status: DC | PRN

## 2017-11-07 MED ORDER — ESCITALOPRAM OXALATE 10 MG PO TABS
10.0000 mg | ORAL_TABLET | Freq: Every day | ORAL | Status: DC
  Administered 2017-11-07: 10 mg via ORAL
  Filled 2017-11-07: qty 1

## 2017-11-07 MED ORDER — OXYCODONE HCL 5 MG PO TABS
10.0000 mg | ORAL_TABLET | ORAL | Status: DC | PRN
  Administered 2017-11-07 (×3): 15 mg via ORAL
  Filled 2017-11-07 (×2): qty 3
  Filled 2017-11-07: qty 2
  Filled 2017-11-07: qty 3

## 2017-11-07 MED ORDER — HYDROMORPHONE HCL 1 MG/ML IJ SOLN
INTRAMUSCULAR | Status: AC
  Filled 2017-11-07: qty 1

## 2017-11-07 MED ORDER — ROCURONIUM BROMIDE 10 MG/ML (PF) SYRINGE
PREFILLED_SYRINGE | INTRAVENOUS | Status: AC
  Filled 2017-11-07: qty 5

## 2017-11-07 MED ORDER — ONDANSETRON HCL 4 MG/2ML IJ SOLN
INTRAMUSCULAR | Status: DC | PRN
  Administered 2017-11-07: 4 mg via INTRAVENOUS

## 2017-11-07 MED ORDER — FLEET ENEMA 7-19 GM/118ML RE ENEM: 1.0000 | ENEMA | Freq: Once | RECTAL | Status: DC | PRN

## 2017-11-07 MED ORDER — FENTANYL CITRATE (PF) 100 MCG/2ML IJ SOLN
INTRAMUSCULAR | Status: AC
  Filled 2017-11-07: qty 2

## 2017-11-07 MED ORDER — BUPIVACAINE HCL (PF) 0.25 % IJ SOLN
INTRAMUSCULAR | Status: AC
  Filled 2017-11-07: qty 30

## 2017-11-07 MED ORDER — OXYCODONE HCL 5 MG PO TABS
ORAL_TABLET | ORAL | Status: AC
  Filled 2017-11-07: qty 2

## 2017-11-07 MED ORDER — FLUTICASONE PROPIONATE 50 MCG/ACT NA SUSP: 1.0000 | Freq: Every day | NASAL | Status: DC | PRN

## 2017-11-07 MED ORDER — ACETAMINOPHEN 500 MG PO TABS
1000.0000 mg | ORAL_TABLET | Freq: Four times a day (QID) | ORAL | Status: AC
  Administered 2017-11-07 – 2017-11-08 (×4): 1000 mg via ORAL
  Filled 2017-11-07 (×4): qty 2

## 2017-11-07 MED ORDER — TRANEXAMIC ACID 1000 MG/10ML IV SOLN
1000.0000 mg | Freq: Once | INTRAVENOUS | Status: AC
  Administered 2017-11-07: 1000 mg via INTRAVENOUS
  Filled 2017-11-07: qty 1100

## 2017-11-07 MED ORDER — AMLODIPINE BESYLATE 5 MG PO TABS
5.0000 mg | ORAL_TABLET | Freq: Every day | ORAL | Status: DC
  Administered 2017-11-08: 5 mg via ORAL
  Filled 2017-11-07: qty 1

## 2017-11-07 MED ORDER — PHENOL 1.4 % MT LIQD: 1.0000 | OROMUCOSAL | Status: DC | PRN

## 2017-11-07 MED ORDER — DIPHENHYDRAMINE HCL 12.5 MG/5ML PO ELIX: 12.5000 mg | ORAL_SOLUTION | ORAL | Status: DC | PRN

## 2017-11-07 MED ORDER — METOCLOPRAMIDE HCL 5 MG/ML IJ SOLN: 10.0000 mg | Freq: Once | INTRAMUSCULAR | Status: DC | PRN

## 2017-11-07 MED ORDER — SUGAMMADEX SODIUM 200 MG/2ML IV SOLN
INTRAVENOUS | Status: AC
  Filled 2017-11-07: qty 2

## 2017-11-07 MED ORDER — BISACODYL 10 MG RE SUPP: 10.0000 mg | Freq: Every day | RECTAL | Status: DC | PRN

## 2017-11-07 MED ORDER — DEXAMETHASONE SODIUM PHOSPHATE 10 MG/ML IJ SOLN
10.0000 mg | Freq: Once | INTRAMUSCULAR | Status: AC
  Administered 2017-11-07: 10 mg via INTRAVENOUS

## 2017-11-07 MED ORDER — BUPIVACAINE HCL (PF) 0.25 % IJ SOLN
INTRAMUSCULAR | Status: DC | PRN
  Administered 2017-11-07: 30 mL

## 2017-11-07 MED ORDER — POLYETHYLENE GLYCOL 3350 17 G PO PACK: 17.0000 g | PACK | Freq: Every day | ORAL | Status: DC | PRN

## 2017-11-07 MED ORDER — PRAVASTATIN SODIUM 40 MG PO TABS
40.0000 mg | ORAL_TABLET | Freq: Every evening | ORAL | Status: DC
  Administered 2017-11-07: 40 mg via ORAL
  Filled 2017-11-07 (×2): qty 1
  Filled 2017-11-07: qty 2

## 2017-11-07 MED ORDER — 0.9 % SODIUM CHLORIDE (POUR BTL) OPTIME
TOPICAL | Status: DC | PRN
  Administered 2017-11-07: 1000 mL

## 2017-11-07 MED ORDER — ACETAMINOPHEN 325 MG PO TABS: 325.0000 mg | ORAL_TABLET | Freq: Four times a day (QID) | ORAL | Status: DC | PRN

## 2017-11-07 MED ORDER — FENTANYL CITRATE (PF) 100 MCG/2ML IJ SOLN
INTRAMUSCULAR | Status: DC | PRN
  Administered 2017-11-07 (×3): 50 ug via INTRAVENOUS

## 2017-11-07 MED ORDER — MIDAZOLAM HCL 5 MG/5ML IJ SOLN
INTRAMUSCULAR | Status: DC | PRN
  Administered 2017-11-07: 2 mg via INTRAVENOUS

## 2017-11-07 MED ORDER — MIDAZOLAM HCL 2 MG/2ML IJ SOLN
INTRAMUSCULAR | Status: AC
  Filled 2017-11-07: qty 2

## 2017-11-07 MED ORDER — RIVAROXABAN 10 MG PO TABS
10.0000 mg | ORAL_TABLET | Freq: Every day | ORAL | Status: DC
  Administered 2017-11-08: 10 mg via ORAL
  Filled 2017-11-07: qty 1

## 2017-11-07 MED ORDER — FESOTERODINE FUMARATE ER 8 MG PO TB24
8.0000 mg | ORAL_TABLET | Freq: Every day | ORAL | Status: DC
  Administered 2017-11-08: 8 mg via ORAL
  Filled 2017-11-07: qty 1

## 2017-11-07 MED ORDER — HYDROMORPHONE HCL 1 MG/ML IJ SOLN: 0.5000 mg | INTRAMUSCULAR | Status: DC | PRN

## 2017-11-07 MED ORDER — OXYCODONE HCL 5 MG PO TABS
5.0000 mg | ORAL_TABLET | ORAL | Status: DC | PRN
  Administered 2017-11-07 – 2017-11-08 (×3): 10 mg via ORAL
  Filled 2017-11-07: qty 2

## 2017-11-07 MED ORDER — OLOPATADINE HCL 0.1 % OP SOLN: 1.0000 [drp] | Freq: Every day | OPHTHALMIC | Status: DC | PRN

## 2017-11-07 MED ORDER — ONDANSETRON HCL 4 MG/2ML IJ SOLN: 4.0000 mg | Freq: Four times a day (QID) | INTRAMUSCULAR | Status: DC | PRN

## 2017-11-07 MED ORDER — CHLORHEXIDINE GLUCONATE 4 % EX LIQD: 60.0000 mL | Freq: Once | CUTANEOUS | Status: DC

## 2017-11-07 MED ORDER — METOCLOPRAMIDE HCL 5 MG PO TABS: 5.0000 mg | ORAL_TABLET | Freq: Three times a day (TID) | ORAL | Status: DC | PRN

## 2017-11-07 MED ORDER — DOCUSATE SODIUM 100 MG PO CAPS
100.0000 mg | ORAL_CAPSULE | Freq: Two times a day (BID) | ORAL | Status: DC
Start: 2017-11-07 — End: 2017-11-08
  Administered 2017-11-07 – 2017-11-08 (×2): 100 mg via ORAL
  Filled 2017-11-07 (×2): qty 1

## 2017-11-07 MED ORDER — ONDANSETRON HCL 4 MG PO TABS: 4.0000 mg | ORAL_TABLET | Freq: Four times a day (QID) | ORAL | Status: DC | PRN

## 2017-11-07 MED ORDER — METHOCARBAMOL 1000 MG/10ML IJ SOLN
500.0000 mg | Freq: Four times a day (QID) | INTRAVENOUS | Status: DC | PRN
  Administered 2017-11-07: 500 mg via INTRAVENOUS
  Filled 2017-11-07: qty 550

## 2017-11-07 MED ORDER — ROCURONIUM BROMIDE 10 MG/ML (PF) SYRINGE
PREFILLED_SYRINGE | INTRAVENOUS | Status: DC | PRN
  Administered 2017-11-07: 50 mg via INTRAVENOUS

## 2017-11-07 MED ORDER — SODIUM CHLORIDE 0.9 % IV SOLN
INTRAVENOUS | Status: DC
  Administered 2017-11-07 – 2017-11-08 (×2): via INTRAVENOUS

## 2017-11-07 MED ORDER — MEPERIDINE HCL 50 MG/ML IJ SOLN: 6.2500 mg | INTRAMUSCULAR | Status: DC | PRN

## 2017-11-07 MED ORDER — CEFAZOLIN SODIUM-DEXTROSE 2-4 GM/100ML-% IV SOLN
2.0000 g | INTRAVENOUS | Status: AC
  Administered 2017-11-07: 2 g via INTRAVENOUS
  Filled 2017-11-07: qty 100

## 2017-11-07 MED ORDER — PROPOFOL 10 MG/ML IV BOLUS
INTRAVENOUS | Status: DC | PRN
  Administered 2017-11-07: 120 mg via INTRAVENOUS
  Administered 2017-11-07: 20 mg via INTRAVENOUS

## 2017-11-07 MED ORDER — CEFAZOLIN SODIUM-DEXTROSE 2-4 GM/100ML-% IV SOLN
2.0000 g | Freq: Four times a day (QID) | INTRAVENOUS | Status: AC
  Administered 2017-11-07 (×2): 2 g via INTRAVENOUS
  Filled 2017-11-07 (×2): qty 100

## 2017-11-07 MED ORDER — MENTHOL 3 MG MT LOZG: 1.0000 | LOZENGE | OROMUCOSAL | Status: DC | PRN

## 2017-11-07 MED ORDER — SUGAMMADEX SODIUM 200 MG/2ML IV SOLN
INTRAVENOUS | Status: DC | PRN
  Administered 2017-11-07: 200 mg via INTRAVENOUS

## 2017-11-07 MED ORDER — DEXAMETHASONE SODIUM PHOSPHATE 10 MG/ML IJ SOLN
INTRAMUSCULAR | Status: AC
  Filled 2017-11-07: qty 1

## 2017-11-07 MED ORDER — EPHEDRINE 5 MG/ML INJ
INTRAVENOUS | Status: AC
  Filled 2017-11-07: qty 10

## 2017-11-07 MED ORDER — EPHEDRINE SULFATE-NACL 50-0.9 MG/10ML-% IV SOSY
PREFILLED_SYRINGE | INTRAVENOUS | Status: DC | PRN
  Administered 2017-11-07: 15 mg via INTRAVENOUS

## 2017-11-07 MED ORDER — ONDANSETRON HCL 4 MG/2ML IJ SOLN
INTRAMUSCULAR | Status: AC
  Filled 2017-11-07: qty 2

## 2017-11-07 MED ORDER — METHOCARBAMOL 500 MG PO TABS
500.0000 mg | ORAL_TABLET | Freq: Four times a day (QID) | ORAL | Status: DC | PRN
  Administered 2017-11-07 – 2017-11-08 (×2): 500 mg via ORAL
  Filled 2017-11-07 (×2): qty 1

## 2017-11-07 MED ORDER — LIDOCAINE 2% (20 MG/ML) 5 ML SYRINGE
INTRAMUSCULAR | Status: DC | PRN
  Administered 2017-11-07: 60 mg via INTRAVENOUS

## 2017-11-07 MED ORDER — LACTATED RINGERS IV SOLN
INTRAVENOUS | Status: DC
  Administered 2017-11-07: 07:00:00 via INTRAVENOUS
  Administered 2017-11-07: 1000 mL via INTRAVENOUS
  Administered 2017-11-07: 08:00:00 via INTRAVENOUS

## 2017-11-07 MED ORDER — HYDROMORPHONE HCL 1 MG/ML IJ SOLN
0.2500 mg | INTRAMUSCULAR | Status: DC | PRN
  Administered 2017-11-07 (×4): 0.5 mg via INTRAVENOUS

## 2017-11-07 MED ORDER — PROPOFOL 10 MG/ML IV BOLUS
INTRAVENOUS | Status: AC
  Filled 2017-11-07: qty 60

## 2017-11-07 MED ORDER — DEXAMETHASONE SODIUM PHOSPHATE 10 MG/ML IJ SOLN
10.0000 mg | Freq: Once | INTRAMUSCULAR | Status: AC
  Administered 2017-11-08: 10 mg via INTRAVENOUS
  Filled 2017-11-07: qty 1

## 2017-11-07 MED ORDER — IRBESARTAN 300 MG PO TABS
300.0000 mg | ORAL_TABLET | Freq: Every morning | ORAL | Status: DC
  Administered 2017-11-08: 300 mg via ORAL
  Filled 2017-11-07: qty 1
  Filled 2017-11-07: qty 2

## 2017-11-07 SURGICAL SUPPLY — 36 items
BLADE SAG 18X100X1.27 (BLADE) ×2 IMPLANT
CAPT HIP TOTAL 2 ×1 IMPLANT
CLOTH BEACON ORANGE TIMEOUT ST (SAFETY) ×2 IMPLANT
COVER PERINEAL POST (MISCELLANEOUS) ×2 IMPLANT
COVER SURGICAL LIGHT HANDLE (MISCELLANEOUS) ×2 IMPLANT
DRAPE STERI IOBAN 125X83 (DRAPES) ×2 IMPLANT
DRAPE TOP 10253 STERILE (DRAPES) ×1 IMPLANT
DRAPE U-SHAPE 47X51 STRL (DRAPES) ×4 IMPLANT
DRSG ADAPTIC 3X8 NADH LF (GAUZE/BANDAGES/DRESSINGS) ×2 IMPLANT
DRSG MEPILEX BORDER 4X4 (GAUZE/BANDAGES/DRESSINGS) ×2 IMPLANT
DRSG MEPILEX BORDER 4X8 (GAUZE/BANDAGES/DRESSINGS) ×2 IMPLANT
DURAPREP 26ML APPLICATOR (WOUND CARE) ×2 IMPLANT
ELECT REM PT RETURN 15FT ADLT (MISCELLANEOUS) ×2 IMPLANT
EVACUATOR 1/8 PVC DRAIN (DRAIN) ×2 IMPLANT
GLOVE BIO SURGEON STRL SZ7.5 (GLOVE) ×2 IMPLANT
GLOVE BIO SURGEON STRL SZ8 (GLOVE) ×4 IMPLANT
GLOVE BIOGEL PI IND STRL 7.0 (GLOVE) IMPLANT
GLOVE BIOGEL PI IND STRL 7.5 (GLOVE) IMPLANT
GLOVE BIOGEL PI IND STRL 8 (GLOVE) ×2 IMPLANT
GLOVE BIOGEL PI INDICATOR 7.0 (GLOVE) ×1
GLOVE BIOGEL PI INDICATOR 7.5 (GLOVE) ×3
GLOVE BIOGEL PI INDICATOR 8 (GLOVE) ×2
GLOVE ECLIPSE 7.0 STRL STRAW (GLOVE) ×1 IMPLANT
GLOVE SURG SS PI 7.0 STRL IVOR (GLOVE) ×1 IMPLANT
GOWN STRL REUS W/TWL LRG LVL3 (GOWN DISPOSABLE) ×3 IMPLANT
GOWN STRL REUS W/TWL XL LVL3 (GOWN DISPOSABLE) ×3 IMPLANT
PACK ANTERIOR HIP CUSTOM (KITS) ×2 IMPLANT
STRIP CLOSURE SKIN 1/2X4 (GAUZE/BANDAGES/DRESSINGS) ×3 IMPLANT
SUT ETHIBOND NAB CT1 #1 30IN (SUTURE) ×2 IMPLANT
SUT MNCRL AB 4-0 PS2 18 (SUTURE) ×2 IMPLANT
SUT STRATAFIX 0 PDS 27 VIOLET (SUTURE) ×2
SUT VIC AB 2-0 CT1 27 (SUTURE) ×4
SUT VIC AB 2-0 CT1 TAPERPNT 27 (SUTURE) ×2 IMPLANT
SUTURE STRATFX 0 PDS 27 VIOLET (SUTURE) ×1 IMPLANT
TRAY FOLEY CATH SILVER 14FR (SET/KITS/TRAYS/PACK) ×1 IMPLANT
YANKAUER SUCT BULB TIP 10FT TU (MISCELLANEOUS) ×2 IMPLANT

## 2017-11-07 NOTE — Progress Notes (Signed)
PT Cancellation Note  Patient Details Name: Alexandra Gibson MRN: [id number] DOB: [birth year]   Cancelled Treatment:    Reason Eval/Treat Not Completed: Attempted PT eval POD 0, pt politely requested to defer PT until tomorrow.

## 2017-11-07 NOTE — Op Note (Signed)
OPERATIVE REPORT- TOTAL HIP ARTHROPLASTY   PREOPERATIVE DIAGNOSIS: Osteoarthritis of the Right hip.   POSTOPERATIVE DIAGNOSIS: Osteoarthritis of the Right  hip.   PROCEDURE: Right total hip arthroplasty, anterior approach.   SURGEON: Gaynelle Arabian, MD   ASSISTANT: Arlee Muslim, PA-C  ANESTHESIA:  General  ESTIMATED BLOOD LOSS:-400 mL    DRAINS: Hemovac x1.   COMPLICATIONS: None   CONDITION: PACU - hemodynamically stable.   BRIEF CLINICAL NOTE: Alexandra Gibson is a 67 y.o. female who has advanced end-  stage arthritis of their Right  hip with progressively worsening pain and  dysfunction.The patient has failed nonoperative management and presents for  total hip arthroplasty.   PROCEDURE IN DETAIL: After successful administration of spinal  anesthetic, the traction boots for the [facility] bed were placed on both  feet and the patient was placed onto the [facility] bed, boots placed into the leg  holders. The Right hip was then isolated from the perineum with plastic  drapes and prepped and draped in the usual sterile fashion. ASIS and  greater trochanter were marked and a oblique incision was made, starting  at about 1 cm lateral and 2 cm distal to the ASIS and coursing towards  the anterior cortex of the femur. The skin was cut with a 10 blade  through subcutaneous tissue to the level of the fascia overlying the  tensor fascia lata muscle. The fascia was then incised in line with the  incision at the junction of the anterior third and posterior 2/3rd. The  muscle was teased off the fascia and then the interval between the TFL  and the rectus was developed. The Hohmann retractor was then placed at  the top of the femoral neck over the capsule. The vessels overlying the  capsule were cauterized and the fat on top of the capsule was removed.  A Hohmann retractor was then placed anterior underneath the rectus  femoris to give exposure to the entire anterior capsule. A T-shaped   capsulotomy was performed. The edges were tagged and the femoral head  was identified.       Osteophytes are removed off the superior acetabulum.  The femoral neck was then cut in situ with an oscillating saw. Traction  was then applied to the left lower extremity utilizing the [facility]  traction. The femoral head was then removed. Retractors were placed  around the acetabulum and then circumferential removal of the labrum was  performed. Osteophytes were also removed. Reaming starts at 45 mm to  medialize and  Increased in 2 mm increments to 49 mm. We reamed in  approximately 40 degrees of abduction, 20 degrees anteversion. A 50 mm  pinnacle acetabular shell was then impacted in anatomic position under  fluoroscopic guidance with excellent purchase. We did not need to place  any additional dome screws. A 32 + 4 neutral marathon liner was then  placed into the acetabular shell.       The femoral lift was then placed along the lateral aspect of the femur  just distal to the vastus ridge. The leg was  externally rotated and capsule  was stripped off the inferior aspect of the femoral neck down to the  level of the lesser trochanter, this was done with electrocautery. The femur was lifted after this was performed. The  leg was then placed in an extended and adducted position essentially delivering the femur. We also removed the capsule superiorly and the piriformis from the piriformis fossa  to gain excellent exposure of the  proximal femur. Rongeur was used to remove some cancellous bone to get  into the lateral portion of the proximal femur for placement of the  initial starter reamer. The starter broaches was placed  the starter broach  and was shown to go down the center of the canal. Broaching  with the  Corail system was then performed starting at size 8, coursing  Up to size 11. A size 11 had excellent torsional and rotational  and axial stability. The trial standard offset neck was then  placed  with a 32 + 1 trial head. The hip was then reduced. We confirmed that  the stem was in the canal both on AP and lateral x-rays. It also has excellent sizing. The hip was reduced with outstanding stability through full extension and full external rotation.. AP pelvis was taken and the leg lengths were measured and found to be equal. Hip was then dislocated again and the femoral head and neck removed. The  femoral broach was removed. Size 11 Corail stem with a standard offset  neck was then impacted into the femur following native anteversion. Has  excellent purchase in the canal. Excellent torsional and rotational and  axial stability. It is confirmed to be in the canal on AP and lateral  fluoroscopic views. The 32 + 1 ceramid head was placed and the hip  reduced with outstanding stability. Again AP pelvis was taken and it  confirmed that the leg lengths were equal. The wound was then copiously  irrigated with saline solution and the capsule reattached and repaired  with Ethibond suture. 30 ml of .25% Bupivicaine was  injected into the capsule and into the edge of the tensor fascia lata as well as subcutaneous tissue. The fascia overlying the tensor fascia lata was then closed with a running #1 V-Loc. Subcu was closed with interrupted 2-0 Vicryl and subcuticular running 4-0 Monocryl. Incision was cleaned  and dried. Steri-Strips and a bulky sterile dressing applied. Hemovac  drain was hooked to suction and then the patient was awakened and transported to  recovery in stable condition.        Please note that a surgical assistant was a medical necessity for this procedure to perform it in a safe and expeditious manner. Assistant was necessary to provide appropriate retraction of vital neurovascular structures and to prevent femoral fracture and allow for anatomic placement of the prosthesis.  Gaynelle Arabian, M.D.

## 2017-11-07 NOTE — Anesthesia Procedure Notes (Signed)
Procedure Name: Intubation Date/Time: 11/07/2017 7:32 AM Performed by: Talbot Grumbling, CRNA Pre-anesthesia Checklist: Patient identified, Emergency Drugs available, Suction available and Patient being monitored Patient Re-evaluated:Patient Re-evaluated prior to induction Oxygen Delivery Method: Circle system utilized Preoxygenation: Pre-oxygenation with 100% oxygen Induction Type: IV induction Ventilation: Mask ventilation without difficulty Laryngoscope Size: Glidescope (First attempt with Sabra Heck 2, esophageal intubation, noted immediately, ETT withdrawn, glidescope used.) Grade View: Grade I Tube type: Oral Tube size: 7.5 mm Number of attempts: 2 Airway Equipment and Method: Stylet and Video-laryngoscopy Placement Confirmation: ETT inserted through vocal cords under direct vision,  positive ETCO2 and breath sounds checked- equal and bilateral Secured at: 22 cm Tube secured with: Tape Dental Injury: Teeth and Oropharynx as per pre-operative assessment

## 2017-11-07 NOTE — Interval H&P Note (Signed)
History and Physical Interval Note:  11/07/2017 6:25 AM  Alexandra Gibson  has presented today for surgery, with the diagnosis of Osteoarthritis right Hip  The various methods of treatment have been discussed with the patient and family. After consideration of risks, benefits and other options for treatment, the patient has consented to  Procedure(s): RIGHT TOTAL HIP ARTHROPLASTY ANTERIOR APPROACH (Right) as a surgical intervention .  The patient's history has been reviewed, patient examined, no change in status, stable for surgery.  I have reviewed the patient's chart and labs.  Questions were answered to the patient's satisfaction.     Alexandra Gibson

## 2017-11-07 NOTE — Plan of Care (Signed)
Pain rating scale, as well as mobility, and diet progression discussed with patient as it relates to pod #0.

## 2017-11-07 NOTE — Transfer of Care (Signed)
Immediate Anesthesia Transfer of Care Note  Patient: Alexandra Gibson  Procedure(s) Performed: RIGHT TOTAL HIP ARTHROPLASTY ANTERIOR APPROACH (Right Hip)  Patient Location: PACU  Anesthesia Type:General  Level of Consciousness: sedated  Airway & Oxygen Therapy: Patient Spontanous Breathing and Patient connected to face mask oxygen  Post-op Assessment: Report given to RN and Post -op Vital signs reviewed and stable  Post vital signs: Reviewed and stable  Last Vitals:  Vitals Value Taken Time  BP    Temp    Pulse 90 11/07/2017  9:10 AM  Resp    SpO2 99 % 11/07/2017  9:10 AM  Vitals shown include unvalidated device data.  Last Pain:  Vitals:   11/07/17 0616  TempSrc:   PainSc: 7       Patients Stated Pain Goal: 4 (17/51/02 5852)  Complications: No apparent anesthesia complications

## 2017-11-07 NOTE — Anesthesia Postprocedure Evaluation (Signed)
Anesthesia Post Note  Patient: Alexandra Gibson  Procedure(s) Performed: RIGHT TOTAL HIP ARTHROPLASTY ANTERIOR APPROACH (Right Hip)     Patient location during evaluation: PACU Anesthesia Type: General Level of consciousness: oriented and awake and alert Pain management: pain level controlled Vital Signs Assessment: post-procedure vital signs reviewed and stable Respiratory status: spontaneous breathing, respiratory function stable, patient connected to nasal cannula oxygen and nonlabored ventilation Cardiovascular status: blood pressure returned to baseline and stable Postop Assessment: no apparent nausea or vomiting Anesthetic complications: no    Last Vitals:  Vitals:   11/07/17 1000 11/07/17 1015  BP: (!) 141/87 132/79  Pulse: 78 78  Resp: 13 10  Temp:    SpO2: 100% 100%    Last Pain:  Vitals:   11/07/17 1015  TempSrc:   PainSc: 5                  Dazha Kempa A.

## 2017-11-07 NOTE — Discharge Instructions (Addendum)
Dr. Gaynelle Arabian Total Joint Specialist [facility] [address], [place], [place] [address] [phone]  ANTERIOR APPROACH TOTAL HIP REPLACEMENT POSTOPERATIVE DIRECTIONS   Hip Rehabilitation, Guidelines Following Surgery  The results of a hip operation are greatly improved after range of motion and muscle strengthening exercises. Follow all safety measures which are given to protect your hip. If any of these exercises cause increased pain or swelling in your joint, decrease the amount until you are comfortable again. Then slowly increase the exercises. Call your caregiver if you have problems or questions.   HOME CARE INSTRUCTIONS  Remove items at home which could result in a fall. This includes throw rugs or furniture in walking pathways.   ICE to the affected hip every three hours for 30 minutes at a time and then as needed for pain and swelling.  Continue to use ice on the hip for pain and swelling from surgery. You may notice swelling that will progress down to the foot and ankle.  This is normal after surgery.  Elevate the leg when you are not up walking on it.    Continue to use the breathing machine which will help keep your temperature down.  It is common for your temperature to cycle up and down following surgery, especially at night when you are not up moving around and exerting yourself.  The breathing machine keeps your lungs expanded and your temperature down.   DIET You may resume your previous home diet once your are discharged from the hospital.  DRESSING / WOUND CARE / SHOWERING You may shower 3 days after surgery, but keep the wounds dry during showering.  You may use an occlusive plastic wrap (Press'n Seal for example), NO SOAKING/SUBMERGING IN THE BATHTUB.  If the bandage gets wet, change with a clean dry gauze.  If the incision gets wet, pat the wound dry with a clean towel. You may start showering once you are discharged home but do not submerge  the incision under water. Just pat the incision dry and apply a dry gauze dressing on daily. Change the surgical dressing daily and reapply a dry dressing each time.  ACTIVITY Walk with your walker as instructed. Use walker as long as suggested by your caregivers. Avoid periods of inactivity such as sitting longer than an hour when not asleep. This helps prevent blood clots.  You may resume a sexual relationship in one month or when given the OK by your doctor.  You may return to work once you are cleared by your doctor.  Do not drive a car for 6 weeks or until released by you surgeon.  Do not drive while taking narcotics.  WEIGHT BEARING Weight bearing as tolerated with assist device (walker, cane, etc) as directed, use it as long as suggested by your surgeon or therapist, typically at least 4-6 weeks.  POSTOPERATIVE CONSTIPATION PROTOCOL Constipation - defined medically as fewer than three stools per week and severe constipation as less than one stool per week.  One of the most common issues patients have following surgery is constipation.  Even if you have a regular bowel pattern at home, your normal regimen is likely to be disrupted due to multiple reasons following surgery.  Combination of anesthesia, postoperative narcotics, change in appetite and fluid intake all can affect your bowels.  In order to avoid complications following surgery, here are some recommendations in order to help you during your recovery period.  Colace (docusate) - Pick up an over-the-counter  form of Colace or another stool softener and take twice a day as long as you are requiring postoperative pain medications.  Take with a full glass of water daily.  If you experience loose stools or diarrhea, hold the colace until you stool forms back up.  If your symptoms do not get better within 1 week or if they get worse, check with your doctor.  Dulcolax (bisacodyl) - Pick up over-the-counter and take as directed by the  product packaging as needed to assist with the movement of your bowels.  Take with a full glass of water.  Use this product as needed if not relieved by Colace only.   MiraLax (polyethylene glycol) - Pick up over-the-counter to have on hand.  MiraLax is a solution that will increase the amount of water in your bowels to assist with bowel movements.  Take as directed and can mix with a glass of water, juice, soda, coffee, or tea.  Take if you go more than two days without a movement. Do not use MiraLax more than once per day. Call your doctor if you are still constipated or irregular after using this medication for 7 days in a row.  If you continue to have problems with postoperative constipation, please contact the office for further assistance and recommendations.  If you experience "the worst abdominal pain ever" or develop nausea or vomiting, please contact the office immediatly for further recommendations for treatment.  ITCHING  If you experience itching with your medications, try taking only a single pain pill, or even half a pain pill at a time.  You can also use Benadryl over the counter for itching or also to help with sleep.   TED HOSE STOCKINGS Wear the elastic stockings on both legs for three weeks following surgery during the day but you may remove then at night for sleeping.  MEDICATIONS See your medication summary on the After Visit Summary that the nursing staff will review with you prior to discharge.  You may have some home medications which will be placed on hold until you complete the course of blood thinner medication.  It is important for you to complete the blood thinner medication as prescribed by your surgeon.  Continue your approved medications as instructed at time of discharge.  PRECAUTIONS If you experience chest pain or shortness of breath - call 911 immediately for transfer to the hospital emergency department.  If you develop a fever greater that 101 F, purulent  drainage from wound, increased redness or drainage from wound, foul odor from the wound/dressing, or calf pain - CONTACT YOUR SURGEON.                                                   FOLLOW-UP APPOINTMENTS Make sure you keep all of your appointments after your operation with your surgeon and caregivers. You should call the office at the above phone number and make an appointment for approximately two weeks after the date of your surgery or on the date instructed by your surgeon outlined in the "After Visit Summary".  RANGE OF MOTION AND STRENGTHENING EXERCISES  These exercises are designed to help you keep full movement of your hip joint. Follow your caregiver's or physical therapist's instructions. Perform all exercises about fifteen times, three times per day or as directed. Exercise both hips, even if you  have had only one joint replacement. These exercises can be done on a training (exercise) mat, on the floor, on a table or on a bed. Use whatever works the best and is most comfortable for you. Use music or television while you are exercising so that the exercises are a pleasant break in your day. This will make your life better with the exercises acting as a break in routine you can look forward to.  Lying on your back, slowly slide your foot toward your buttocks, raising your knee up off the floor. Then slowly slide your foot back down until your leg is straight again.  Lying on your back spread your legs as far apart as you can without causing discomfort.  Lying on your side, raise your upper leg and foot straight up from the floor as far as is comfortable. Slowly lower the leg and repeat.  Lying on your back, tighten up the muscle in the front of your thigh (quadriceps muscles). You can do this by keeping your leg straight and trying to raise your heel off the floor. This helps strengthen the largest muscle supporting your knee.  Lying on your back, tighten up the muscles of your buttocks both  with the legs straight and with the knee bent at a comfortable angle while keeping your heel on the floor.   IF YOU ARE TRANSFERRED TO A SKILLED REHAB FACILITY If the patient is transferred to a skilled rehab facility following release from the hospital, a list of the current medications will be sent to the facility for the patient to continue.  When discharged from the skilled rehab facility, please have the facility set up the patient's [place] prior to being released. Also, the skilled facility will be responsible for providing the patient with their medications at time of release from the facility to include their pain medication, the muscle relaxants, and their blood thinner medication. If the patient is still at the rehab facility at time of the two week follow up appointment, the skilled rehab facility will also need to assist the patient in arranging follow up appointment in our office and any transportation needs.  MAKE SURE YOU:  Understand these instructions.  Get help right away if you are not doing well or get worse.    Pick up stool softner and laxative for home use following surgery while on pain medications. Do not submerge incision under water. Please use good hand washing techniques while changing dressing each day. May shower starting three days after surgery. Please use a clean towel to pat the incision dry following showers. Continue to use ice for pain and swelling after surgery. Do not use any lotions or creams on the incision until instructed by your surgeon.  Take Xarelto for two and a half more weeks following discharge from the hospital, then discontinue Xarelto. Once the patient has completed the blood thinner regimen, then take a Baby 81 mg Aspirin daily for three more weeks.    Information on my medicine - XARELTO (Rivaroxaban)  Why was Xarelto prescribed for you? Xarelto was prescribed for you to reduce the risk of blood clots forming  after orthopedic surgery. The medical term for these abnormal blood clots is venous thromboembolism (VTE).  What do you need to know about xarelto ? Take your Xarelto ONCE DAILY at the same time every day. You may take it either with or without food.  If you have difficulty swallowing the tablet whole, you may crush  crush it and mix in applesauce just prior to taking your dose. ° °Take Xarelto® exactly as prescribed by your doctor and DO NOT stop taking Xarelto® without talking to the doctor who prescribed the medication.  Stopping without other VTE prevention medication to take the place of Xarelto® may increase your risk of developing a clot. ° °After discharge, you should have regular check-up appointments with your healthcare provider that is prescribing your Xarelto®.   ° °What do you do if you miss a dose? °If you miss a dose, take it as soon as you remember on the same day then continue your regularly scheduled once daily regimen the next day. Do not take two doses of Xarelto® on the same day.  ° °Important Safety Information °A possible side effect of Xarelto® is bleeding. You should call your healthcare provider right away if you experience any of the following: °? Bleeding from an injury or your nose that does not stop. °? Unusual colored urine (red or dark brown) or unusual colored stools (red or black). °? Unusual bruising for unknown reasons. °? A serious fall or if you hit your head (even if there is no bleeding). ° °Some medicines may interact with Xarelto® and might increase your risk of bleeding while on Xarelto®. To help avoid this, consult your healthcare provider or pharmacist prior to using any new prescription or non-prescription medications, including herbals, vitamins, non-steroidal anti-inflammatory drugs (NSAIDs) and supplements. ° °This website has more information on Xarelto®: www.xarelto.com. ° ° ° °

## 2017-11-08 LAB — BASIC METABOLIC PANEL
ANION GAP: 8 (ref 5–15)
BUN: 12 mg/dL (ref 6–20)
CO2: 28 mmol/L (ref 22–32)
Calcium: 9.9 mg/dL (ref 8.9–10.3)
Chloride: 102 mmol/L (ref 101–111)
Creatinine, Ser: 0.77 mg/dL (ref 0.44–1.00)
GFR calc Af Amer: >60 mL/min (ref >60)
GLUCOSE: 139 mg/dL — AB (ref 65–99)
POTASSIUM: 4.8 mmol/L (ref 3.5–5.1)
Sodium: 138 mmol/L (ref 135–145)

## 2017-11-08 LAB — CBC
HEMATOCRIT: 39.9 % (ref 36.0–46.0)
Hemoglobin: 12.4 g/dL (ref 12.0–15.0)
MCH: 29.3 pg (ref 26.0–34.0)
MCHC: 31.1 g/dL (ref 30.0–36.0)
MCV: 94.3 fL (ref 78.0–100.0)
PLATELETS: 226 10*3/uL (ref 150–400)
RBC: 4.23 MIL/uL (ref 3.87–5.11)
RDW: 13.8 % (ref 11.5–15.5)
WBC: 17.8 10*3/uL — AB (ref 4.0–10.5)

## 2017-11-08 MED ORDER — OXYCODONE HCL 5 MG PO TABS: 5.0000 mg | ORAL_TABLET | ORAL | 0 refills | Status: DC | PRN

## 2017-11-08 MED ORDER — RIVAROXABAN 10 MG PO TABS: 10.0000 mg | ORAL_TABLET | Freq: Every day | ORAL | 0 refills | Status: DC

## 2017-11-08 MED ORDER — METHOCARBAMOL 500 MG PO TABS: 500.0000 mg | ORAL_TABLET | Freq: Four times a day (QID) | ORAL | 0 refills | Status: DC | PRN

## 2017-11-08 MED ORDER — TRAMADOL HCL 50 MG PO TABS: 50.0000 mg | ORAL_TABLET | Freq: Four times a day (QID) | ORAL | 0 refills | Status: DC | PRN

## 2017-11-08 NOTE — Progress Notes (Signed)
Discharge planning, spoke with patient and spouse at bedside. Have chosen [facility] for HH PT, evaluate and treat. Contacted [facility] for referral. Has RW and 3n1. [phone] 

## 2017-11-08 NOTE — Progress Notes (Signed)
   Subjective: 1 Day Post-Op Procedure(s) (LRB): RIGHT TOTAL HIP ARTHROPLASTY ANTERIOR APPROACH (Right) Patient reports pain as mild.   Patient seen in rounds by Dr. Wynelle Link. Patient is well, and has had no acute complaints or problems We will start therapy today.  If they do well with therapy and meets all goals, then will allow home later this afternoon following therapy. Plan is to go Home after hospital stay.  Objective: Vital signs in last 24 hours: Temp:  [97.8 F (36.6 C)-98.6 F (37 C)] 98 F (36.7 C) (03/28 0615) Pulse Rate:  [67-101] 67 (03/28 0615) Resp:  [10-18] 16 (03/28 0615) BP: (111-158)/(62-99) 117/62 (03/28 0615) SpO2:  [93 %-100 %] 100 % (03/28 0615)  Intake/Output from previous day:  Intake/Output Summary (Last 24 hours) at 11/08/2017 0853 Last data filed at 11/08/2017 0620 Gross per 24 hour  Intake 3469 ml  Output 2791 ml  Net 678 ml    Intake/Output this shift: No intake/output data recorded.  Labs: Recent Labs    11/08/17 0529  HGB 12.4   Recent Labs    11/08/17 0529  WBC 17.8*  RBC 4.23  HCT 39.9  PLT 226   Recent Labs    11/08/17 0529  NA 138  K 4.8  CL 102  CO2 28  BUN 12  CREATININE 0.77  GLUCOSE 139*  CALCIUM 9.9   No results for input(s): LABPT, INR in the last 72 hours.  EXAM General - Patient is Alert, Appropriate and Oriented Extremity - Neurovascular intact Sensation intact distally Dressing - dressing C/D/I Motor Function - intact, moving foot and toes well on exam.  Hemovac pulled without difficulty.  Past Medical History:  Diagnosis Date  . Anxiety   . Anxiety and depression   . Arthritis   . Depression   . Fibromyalgia   . GERD (gastroesophageal reflux disease)   . History of kidney stones   . History of urinary tract infection   . Hyperlipidemia   . Hypertension   . Kidney stone    10-2016; " i had a kidney stone that wouldnt move so they had to go in to take it out "  . Numbness    right hand/comes  and goes   . Osteoarthritis of knee    bil-gets injections  . Parathyroid tumor   . Pre-diabetes   . Urinary frequency   . Wears glasses     Assessment/Plan: 1 Day Post-Op Procedure(s) (LRB): RIGHT TOTAL HIP ARTHROPLASTY ANTERIOR APPROACH (Right) Active Problems:   OA (osteoarthritis) of hip  Estimated body mass index is 30.26 kg/m as calculated from the following:   Height as of this encounter: 5\' 8"  (1.727 m).   Weight as of this encounter: 90.3 kg (199 lb). Advance diet Up with therapy Discharge home with home health  DVT Prophylaxis - Xarelto Weight Bearing As Tolerated right Leg Hemovac Pulled Begin Therapy  If meets goals and able to go home: Discharge home with home health Diet - Cardiac diet and Diabetic diet Follow up - in 2 weeks Activity - WBAT Disposition - Home Condition Upon Discharge - pending therapy D/C Meds - See DC Summary DVT Prophylaxis - Xarelto  Arlee Muslim, PA-C Orthopaedic Surgery 11/08/2017, 8:53 AM

## 2017-11-08 NOTE — Evaluation (Signed)
Physical Therapy Evaluation Patient Details Name: Alexandra Gibson MRN: [id number] DOB: 25-Jan-1951 Today's Date: 11/08/2017   History of Present Illness  67 yo female s/p R THA-direct anterior 11/07/17. Hx of R TKA, L THA, L TKA.   Clinical Impression  On eval, pt was Min guard to Min assist for mobility. She walked ~85 feet with a RW, climbed 5 steps with use of rail. Pain rated 5/10 with activity. Reviewed/practiced exercises, gait training, and stair negotiation. Issued HEP at pt's request. All education completed. Okay to d/c from PT standpoint-made RN aware.     Follow Up Recommendations Home health PT    Equipment Recommendations  None recommended by PT    Recommendations for Other Services       Precautions / Restrictions Precautions Precautions: Fall Restrictions Weight Bearing Restrictions: No      Mobility  Bed Mobility Overal bed mobility: Needs Assistance Bed Mobility: Supine to Sit     Supine to sit: Min assist;HOB elevated     General bed mobility comments: small amount of assist for R LE. Increased time.   Transfers Overall transfer level: Needs assistance Equipment used: Rolling walker (2 wheeled) Transfers: Sit to/from Stand Sit to Stand: Min guard;From elevated surface         General transfer comment: close guard for safety. VCs safety, hand placement.   Ambulation/Gait Ambulation/Gait assistance: Min guard Ambulation Distance (Feet): 85 Feet Assistive device: Rolling walker (2 wheeled) Gait Pattern/deviations: Step-to pattern;Step-through pattern;Decreased stride length     General Gait Details: slow gait speed. VCs safety, sequence. Pt beginning to adopt step through gait pattern.   Stairs Min guard assist Up and over portable steps x 2 VCs safety, sequence, technique. Husband present to observe. Close guard for safety.           Wheelchair Mobility    Modified Rankin (Stroke Patients Only)       Balance Overall balance  assessment: Needs assistance         Standing balance support: Bilateral upper extremity supported Standing balance-Leahy Scale: Poor                               Pertinent Vitals/Pain Pain Assessment: 0-10 Pain Score: 5  Pain Location: R hip Pain Descriptors / Indicators: Sore Pain Intervention(s): Monitored during session;Repositioned;Ice applied;RN gave pain meds during session    [place] expects to be discharged to:: Private residence Living Arrangements: Spouse/significant other Available Help at Discharge: Family Type of Home: House Home Access: Stairs to enter Entrance Stairs-Rails: Right Entrance Stairs-Number of Steps: 3 Home Layout: Two level Home Equipment: Environmental consultant - 2 wheels;Bedside commode;Crutches;Wheelchair - manual;Cane - single point      Prior Function Level of Independence: Independent with assistive device(s)         Comments: using cane     Hand Dominance        Extremity/Trunk Assessment   Upper Extremity Assessment Upper Extremity Assessment: Overall WFL for tasks assessed    Lower Extremity Assessment Lower Extremity Assessment: Generalized weakness(s/p R THA)    Cervical / Trunk Assessment Cervical / Trunk Assessment: Normal  Communication   Communication: No difficulties  Cognition Arousal/Alertness: Awake/alert Behavior During Therapy: WFL for tasks assessed/performed Overall Cognitive Status: Within Functional Limits for tasks assessed  General Comments      Exercises  Ankle Pumps, active, 10 reps, supine Quad sets, active, 10 reps, supine Heel Slides, active, 10 reps, supine Hip Abduction/Adduction, active, 10 reps, supine Heel Raises, active, 10 reps, standing Knee flexion, active, 10 reps, standing Marching, active, 10 reps, standing LAQ, active, 10 reps, seated   Assessment/Plan    PT Assessment Patient needs continued PT  services  PT Problem List Decreased strength;Decreased range of motion;Decreased mobility;Decreased balance;Decreased activity tolerance;Decreased knowledge of use of DME;Pain       PT Treatment Interventions DME instruction;Gait training;Functional mobility training;Therapeutic activities;Balance training;Patient/family education;Therapeutic exercise;Stair training    PT Goals (Current goals can be found in the Care Plan section)  Acute Rehab PT Goals Patient Stated Goal: regain independence PT Goal Formulation: With patient Time For Goal Achievement: 11/22/17 Potential to Achieve Goals: Good    Frequency 7X/week   Barriers to discharge        Co-evaluation               AM-PAC PT "6 Clicks" Daily Activity  Outcome Measure Difficulty turning over in bed (including adjusting bedclothes, sheets and blankets)?: A Lot Difficulty moving from lying on back to sitting on the side of the bed? : Unable Difficulty sitting down on and standing up from a chair with arms (e.g., wheelchair, bedside commode, etc,.)?: A Little Help needed moving to and from a bed to chair (including a wheelchair)?: A Little Help needed walking in hospital room?: A Little Help needed climbing 3-5 steps with a railing? : A Little 6 Click Score: 15    End of Session Equipment Utilized During Treatment: Gait belt Activity Tolerance: Patient tolerated treatment well Patient left: in chair;with call bell/phone within reach;with family/visitor present   PT Visit Diagnosis: Muscle weakness (generalized) (M62.81);Difficulty in walking, not elsewhere classified (R26.2);Pain Pain - Right/Left: Right Pain - part of body: Hip    Time: 6834-1962 PT Time Calculation (min) (ACUTE ONLY): 35 min   Charges:   PT Evaluation $PT Eval Low Complexity: 1 Low PT Treatments $Gait Training: 8-22 mins   PT G Codes:          Weston Anna, MPT Pager: [phone]

## 2017-11-08 NOTE — Progress Notes (Signed)
Discharge instructions were reviewed with the pt. Any questions or concerns were address. Pt reported no further questions regarding their plan of care and follow up appointment.   11/08/2017 [place], RN

## 2017-11-08 NOTE — Discharge Summary (Signed)
Physician Discharge Summary   Patient ID: Alexandra Gibson MRN: [id number] DOB/AGE: 1950-09-03 67 y.o. 2 Admit date: 11/07/2017 Discharge date: 11-08-2017  Primary Diagnosis:  Osteoarthritis of the Right  hip.    Admission Diagnoses:  Past Medical History:  Diagnosis Date  . Anxiety   . Anxiety and depression   . Arthritis   . Depression   . Fibromyalgia   . GERD (gastroesophageal reflux disease)   . History of kidney stones   . History of urinary tract infection   . Hyperlipidemia   . Hypertension   . Kidney stone    10-2016; " i had a kidney stone that wouldnt move so they had to go in to take it out "  . Numbness    right hand/comes and goes   . Osteoarthritis of knee    bil-gets injections  . Parathyroid tumor   . Pre-diabetes   . Urinary frequency   . Wears glasses    Discharge Diagnoses:   Active Problems:   OA (osteoarthritis) of hip  Estimated body mass index is 30.26 kg/m as calculated from the following:   Height as of this encounter: '5\' 8"'  (1.727 m).   Weight as of this encounter: 90.3 kg (199 lb).  Procedure(s) (LRB): RIGHT TOTAL HIP ARTHROPLASTY ANTERIOR APPROACH (Right)   Consults: None  HPI: Alexandra Gibson is a 67 y.o. female who has advanced end-  stage arthritis of their Right  hip with progressively worsening pain and  dysfunction.The patient has failed nonoperative management and presents for  total hip arthroplasty.    Laboratory Data: Admission on 11/07/2017  Component Date Value Ref Range Status  . WBC 11/08/2017 17.8* 4.0 - 10.5 K/uL Final  . RBC 11/08/2017 4.23  3.87 - 5.11 MIL/uL Final  . Hemoglobin 11/08/2017 12.4  12.0 - 15.0 g/dL Final  . HCT 11/08/2017 39.9  36.0 - 46.0 % Final  . MCV 11/08/2017 94.3  78.0 - 100.0 fL Final  . MCH 11/08/2017 29.3  26.0 - 34.0 pg Final  . MCHC 11/08/2017 31.1  30.0 - 36.0 g/dL Final  . RDW 11/08/2017 13.8  11.5 - 15.5 % Final  . Platelets 11/08/2017 226  150 - 400 K/uL Final   Performed at  [facility], [place] [address] [place], [place] [address]  . Sodium 11/08/2017 138  135 - 145 mmol/L Final  . Potassium 11/08/2017 4.8  3.5 - 5.1 mmol/L Final  . Chloride 11/08/2017 102  101 - 111 mmol/L Final  . CO2 11/08/2017 28  22 - 32 mmol/L Final  . Glucose, Bld 11/08/2017 139* 65 - 99 mg/dL Final  . BUN 11/08/2017 12  6 - 20 mg/dL Final  . Creatinine, Ser 11/08/2017 0.77  0.44 - 1.00 mg/dL Final  . Calcium 11/08/2017 9.9  8.9 - 10.3 mg/dL Final  . GFR calc non Af Amer 11/08/2017 >60  >60 mL/min Final  . GFR calc Af Amer 11/08/2017 >60  >60 mL/min Final   Comment: (NOTE) The eGFR has been calculated using the CKD EPI equation. This calculation has not been validated in all clinical situations. eGFR's persistently <60 mL/min signify possible Chronic Kidney Disease.   [name] gap 11/08/2017 8  5 - 15 Final   Performed at [facility], [place] [address], [place], [place] [address]  Hospital Outpatient Visit on 10/30/2017  Component Date Value Ref Range Status  . MRSA, PCR 10/30/2017 NEGATIVE  NEGATIVE Final  . Staphylococcus aureus 10/30/2017 NEGATIVE  NEGATIVE Final   Comment: (NOTE) The Xpert SA Assay (FDA approved for NASAL specimens in patients 49 years of age and older), is one component of a comprehensive surveillance program. It is not intended to diagnose infection nor to guide or monitor treatment. Performed at [facility], [place] [address], [place], [place] [address]   . aPTT 10/30/2017 31  24 - 36 seconds Final   Performed at [facility], [place] [address], [place], [place] [address]  . WBC 10/30/2017 8.0  4.0 - 10.5 K/uL Final  . RBC 10/30/2017 4.96  3.87 - 5.11 MIL/uL Final  . Hemoglobin 10/30/2017 14.5  12.0 - 15.0 g/dL Final  . HCT 10/30/2017 46.4* 36.0 - 46.0 % Final  . MCV 10/30/2017 93.5  78.0 - 100.0 fL Final  . MCH 10/30/2017 29.2  26.0 - 34.0 pg Final  . MCHC 10/30/2017 31.3  30.0 -  36.0 g/dL Final  . RDW 10/30/2017 13.8  11.5 - 15.5 % Final  . Platelets 10/30/2017 298  150 - 400 K/uL Final   Performed at [facility], [place] [address] [place], [place] [address]  . Prothrombin Time 10/30/2017 13.7  11.4 - 15.2 seconds Final  . INR 10/30/2017 1.06   Final   Performed at [place] [address], [place], [place] [address]  . ABO/RH(D) 10/30/2017 O POS   Final  . Antibody Screen 10/30/2017 NEG   Final  . Sample Expiration 10/30/2017 11/10/2017   Final  . Extend sample reason 10/30/2017    Final                   Value:NO TRANSFUSIONS OR PREGNANCY IN THE PAST 3 MONTHS Performed at [facility], [place] [address] [place],  [address]      X-Rays:Dg Pelvis Portable  Result Date: 11/07/2017 CLINICAL DATA:  Status post hip replacement. EXAM: PORTABLE PELVIS 1-2 VIEWS COMPARISON:  Plain film of the pelvis dated 08/04/2015. FINDINGS: Status post right hip arthroplasty. Hardware appears intact and appropriately positioned. Expected postsurgical changes within the overlying soft tissues, with associated drain in place. Stable appearance of the left hip arthroplasty. Osseous alignment is anatomic. IMPRESSION: Status post right hip arthroplasty. Hardware appears intact and appropriately positioned. No evidence of surgical complicating feature. Electronically Signed   By: Franki Cabot M.D.   On: 11/07/2017 10:21   Dg C-arm 1-60 Min-no Report  Result Date: 11/07/2017 Fluoroscopy was utilized by the requesting physician.  No radiographic interpretation.    EKG: Orders placed or performed during the hospital encounter of 09/13/16  . EKG 12-Lead  . EKG 12-Lead     Hospital Course: Patient was admitted to [facility] and taken to the OR and underwent the above state procedure without complications.  Patient tolerated the procedure well and was later transferred to the recovery room and then to the orthopaedic  floor for postoperative care.  They were given PO and IV analgesics for pain control following their surgery.  They were given 24 hours of postoperative antibiotics of  Anti-infectives (From admission, onward)   Start     Dose/Rate Route Frequency Ordered Stop   11/07/17 1400  ceFAZolin (ANCEF) IVPB 2g/100 mL premix     2 g 200 mL/hr over 30 Minutes Intravenous Every 6 hours 11/07/17 1018 11/07/17 2000   11/07/17 0544  ceFAZolin (ANCEF) IVPB 2g/100 mL premix     2 g 200 mL/hr over 30 Minutes Intravenous On  call to O.R. 11/07/17 7591 11/07/17 6384     and started on DVT prophylaxis in the form of Xarelto.   PT and OT were ordered for total hip protocol.  The patient was allowed to be WBAT with therapy. Discharge planning was consulted to help with postop disposition and equipment needs.  Patient had a good night on the evening of surgery.  They started to get up OOB with therapy on day one.  Hemovac drain was pulled without difficulty.Dressing was checked and looked okay. Patient was seen in rounds on day one and was ready to go home later that same day.  Diet: Cardiac diet and Diabetic diet Activity:WBAT Follow-up:in 2 weeks Disposition - Home Discharged Condition: good   Discharge Instructions    Call MD / Call 911   Complete by:  As directed    If you experience chest pain or shortness of breath, CALL 911 and be transported to the hospital emergency room.  If you develope a fever above 101 F, pus (white drainage) or increased drainage or redness at the wound, or calf pain, call your surgeon's office.   Change dressing   Complete by:  As directed    You may change your dressing dressing daily with sterile 4 x 4 inch gauze dressing and paper tape.  Do not submerge the incision under water.   Constipation Prevention   Complete by:  As directed    Drink plenty of fluids.  Prune juice may be helpful.  You may use a stool softener, such as Colace (over the counter) 100 mg twice a day.  Use  MiraLax (over the counter) for constipation as needed.   Diet - low sodium heart healthy   Complete by:  As directed    Discharge instructions   Complete by:  As directed    Take Xarelto for two and a half more weeks, then discontinue Xarelto. Once the patient has completed the blood thinner regimen, then take a Baby 81 mg Aspirin daily for three more weeks.   Pick up stool softner and laxative for home use following surgery while on pain medications. Do not submerge incision under water. Please use good hand washing techniques while changing dressing each day. May shower starting three days after surgery. Please use a clean towel to pat the incision dry following showers. Continue to use ice for pain and swelling after surgery. Do not use any lotions or creams on the incision until instructed by your surgeon.  Wear both TED hose on both legs during the day every day for three weeks, but may remove the TED hose at night at home.  Postoperative Constipation Protocol  Constipation - defined medically as fewer than three stools per week and severe constipation as less than one stool per week.  One of the most common issues patients have following surgery is constipation.  Even if you have a regular bowel pattern at home, your normal regimen is likely to be disrupted due to multiple reasons following surgery.  Combination of anesthesia, postoperative narcotics, change in appetite and fluid intake all can affect your bowels.  In order to avoid complications following surgery, here are some recommendations in order to help you during your recovery period.  Colace (docusate) - Pick up an over-the-counter form of Colace or another stool softener and take twice a day as long as you are requiring postoperative pain medications.  Take with a full glass of water daily.  If you experience loose stools or diarrhea,  hold the colace until you stool forms back up.  If your symptoms do not get better within 1  week or if they get worse, check with your doctor.  Dulcolax (bisacodyl) - Pick up over-the-counter and take as directed by the product packaging as needed to assist with the movement of your bowels.  Take with a full glass of water.  Use this product as needed if not relieved by Colace only.   MiraLax (polyethylene glycol) - Pick up over-the-counter to have on hand.  MiraLax is a solution that will increase the amount of water in your bowels to assist with bowel movements.  Take as directed and can mix with a glass of water, juice, soda, coffee, or tea.  Take if you go more than two days without a movement. Do not use MiraLax more than once per day. Call your doctor if you are still constipated or irregular after using this medication for 7 days in a row.  If you continue to have problems with postoperative constipation, please contact the office for further assistance and recommendations.  If you experience "the worst abdominal pain ever" or develop nausea or vomiting, please contact the office immediatly for further recommendations for treatment.   Do not sit on low chairs, stoools or toilet seats, as it may be difficult to get up from low surfaces   Complete by:  As directed    Driving restrictions   Complete by:  As directed    No driving until released by the physician.   Increase activity slowly as tolerated   Complete by:  As directed    Lifting restrictions   Complete by:  As directed    No lifting until released by the physician.   Patient may shower   Complete by:  As directed    You may shower without a dressing once there is no drainage.  Do not wash over the wound.  If drainage remains, do not shower until drainage stops.   TED hose   Complete by:  As directed    Use stockings (TED hose) for 3 weeks on both leg(s).  You may remove them at night for sleeping.   Weight bearing as tolerated   Complete by:  As directed    Laterality:  right   Extremity:  Lower     Allergies as  of 11/08/2017      Reactions   Atorvastatin Other (See Comments)   REACTION: leg cramp      Medication List    STOP taking these medications   celecoxib 200 MG capsule Commonly known as:  CELEBREX   COD LIVER OIL/VITAMINS A & D PO     TAKE these medications   amLODipine 5 MG tablet Commonly known as:  NORVASC Take 5 mg by mouth daily.   escitalopram 10 MG tablet Commonly known as:  LEXAPRO Take 10 mg by mouth at bedtime.   fluticasone 50 MCG/ACT nasal spray Commonly known as:  FLONASE Place 1 spray into both nostrils daily as needed for allergies or rhinitis.   irbesartan 300 MG tablet Commonly known as:  AVAPRO Take 300 mg by mouth every morning.   methocarbamol 500 MG tablet Commonly known as:  ROBAXIN Take 1 tablet (500 mg total) by mouth every 6 (six) hours as needed for muscle spasms.   olopatadine 0.1 % ophthalmic solution Commonly known as:  PATANOL Place 1 drop into both eyes daily as needed for allergies.   oxyCODONE 5 MG immediate release  tablet Commonly known as:  Oxy IR/ROXICODONE Take 1-2 tablets (5-10 mg total) by mouth every 4 (four) hours as needed for moderate pain or severe pain.   pravastatin 40 MG tablet Commonly known as:  PRAVACHOL Take 40 mg by mouth every evening.   rivaroxaban 10 MG Tabs tablet Commonly known as:  XARELTO Take 1 tablet (10 mg total) by mouth daily with breakfast. Take Xarelto for two and a half more weeks following discharge from the hospital, then discontinue Xarelto. Once the patient has completed the blood thinner regimen, then take a Baby 81 mg Aspirin daily for three more weeks.   tolterodine 4 MG 24 hr capsule Commonly known as:  DETROL LA Take 4 mg by mouth daily.   traMADol 50 MG tablet Commonly known as:  ULTRAM Take 1-2 tablets (50-100 mg total) by mouth every 6 (six) hours as needed (mild pain).            Discharge Care Instructions  (From admission, onward)        Start     Ordered   11/08/17  0000  Weight bearing as tolerated    Question Answer Comment  Laterality right   Extremity Lower      11/08/17 0904   11/08/17 0000  Change dressing    Comments:  You may change your dressing dressing daily with sterile 4 x 4 inch gauze dressing and paper tape.  Do not submerge the incision under water.   11/08/17 7322     Follow-up Information    Gaynelle Arabian, MD. Schedule an appointment as soon as possible for a visit on 11/20/2017.   Specialty:  Orthopedic Surgery Contact information: [address] [place] [address] [phone]           Signed: Arlee Muslim, PA-C Orthopaedic Surgery 11/08/2017, 9:05 AM

## 2017-11-10 DIAGNOSIS — Z96653 Presence of artificial knee joint, bilateral: Secondary | ICD-10-CM | POA: Diagnosis not present

## 2017-11-10 DIAGNOSIS — Z96643 Presence of artificial hip joint, bilateral: Secondary | ICD-10-CM | POA: Diagnosis not present

## 2017-11-10 DIAGNOSIS — Z7901 Long term (current) use of anticoagulants: Secondary | ICD-10-CM | POA: Diagnosis not present

## 2017-11-10 DIAGNOSIS — F419 Anxiety disorder, unspecified: Secondary | ICD-10-CM | POA: Diagnosis not present

## 2017-11-10 DIAGNOSIS — G5600 Carpal tunnel syndrome, unspecified upper limb: Secondary | ICD-10-CM | POA: Diagnosis not present

## 2017-11-10 DIAGNOSIS — F329 Major depressive disorder, single episode, unspecified: Secondary | ICD-10-CM | POA: Diagnosis not present

## 2017-11-10 DIAGNOSIS — K219 Gastro-esophageal reflux disease without esophagitis: Secondary | ICD-10-CM | POA: Diagnosis not present

## 2017-11-10 DIAGNOSIS — Z471 Aftercare following joint replacement surgery: Secondary | ICD-10-CM | POA: Diagnosis not present

## 2017-11-10 DIAGNOSIS — G2581 Restless legs syndrome: Secondary | ICD-10-CM | POA: Diagnosis not present

## 2017-11-10 DIAGNOSIS — M797 Fibromyalgia: Secondary | ICD-10-CM | POA: Diagnosis not present

## 2017-11-12 DIAGNOSIS — G5600 Carpal tunnel syndrome, unspecified upper limb: Secondary | ICD-10-CM | POA: Diagnosis not present

## 2017-11-12 DIAGNOSIS — Z471 Aftercare following joint replacement surgery: Secondary | ICD-10-CM | POA: Diagnosis not present

## 2017-11-12 DIAGNOSIS — Z96653 Presence of artificial knee joint, bilateral: Secondary | ICD-10-CM | POA: Diagnosis not present

## 2017-11-12 DIAGNOSIS — K219 Gastro-esophageal reflux disease without esophagitis: Secondary | ICD-10-CM | POA: Diagnosis not present

## 2017-11-12 DIAGNOSIS — Z7901 Long term (current) use of anticoagulants: Secondary | ICD-10-CM | POA: Diagnosis not present

## 2017-11-12 DIAGNOSIS — M797 Fibromyalgia: Secondary | ICD-10-CM | POA: Diagnosis not present

## 2017-11-12 DIAGNOSIS — Z96643 Presence of artificial hip joint, bilateral: Secondary | ICD-10-CM | POA: Diagnosis not present

## 2017-11-12 DIAGNOSIS — F329 Major depressive disorder, single episode, unspecified: Secondary | ICD-10-CM | POA: Diagnosis not present

## 2017-11-12 DIAGNOSIS — G2581 Restless legs syndrome: Secondary | ICD-10-CM | POA: Diagnosis not present

## 2017-11-12 DIAGNOSIS — F419 Anxiety disorder, unspecified: Secondary | ICD-10-CM | POA: Diagnosis not present

## 2017-11-16 DIAGNOSIS — Z96643 Presence of artificial hip joint, bilateral: Secondary | ICD-10-CM | POA: Diagnosis not present

## 2017-11-16 DIAGNOSIS — K219 Gastro-esophageal reflux disease without esophagitis: Secondary | ICD-10-CM | POA: Diagnosis not present

## 2017-11-16 DIAGNOSIS — Z96653 Presence of artificial knee joint, bilateral: Secondary | ICD-10-CM | POA: Diagnosis not present

## 2017-11-16 DIAGNOSIS — F329 Major depressive disorder, single episode, unspecified: Secondary | ICD-10-CM | POA: Diagnosis not present

## 2017-11-16 DIAGNOSIS — M797 Fibromyalgia: Secondary | ICD-10-CM | POA: Diagnosis not present

## 2017-11-16 DIAGNOSIS — G5600 Carpal tunnel syndrome, unspecified upper limb: Secondary | ICD-10-CM | POA: Diagnosis not present

## 2017-11-16 DIAGNOSIS — G2581 Restless legs syndrome: Secondary | ICD-10-CM | POA: Diagnosis not present

## 2017-11-16 DIAGNOSIS — F419 Anxiety disorder, unspecified: Secondary | ICD-10-CM | POA: Diagnosis not present

## 2017-11-16 DIAGNOSIS — Z7901 Long term (current) use of anticoagulants: Secondary | ICD-10-CM | POA: Diagnosis not present

## 2017-11-16 DIAGNOSIS — Z471 Aftercare following joint replacement surgery: Secondary | ICD-10-CM | POA: Diagnosis not present

## 2017-11-19 DIAGNOSIS — Z7901 Long term (current) use of anticoagulants: Secondary | ICD-10-CM | POA: Diagnosis not present

## 2017-11-19 DIAGNOSIS — F419 Anxiety disorder, unspecified: Secondary | ICD-10-CM | POA: Diagnosis not present

## 2017-11-19 DIAGNOSIS — Z471 Aftercare following joint replacement surgery: Secondary | ICD-10-CM | POA: Diagnosis not present

## 2017-11-19 DIAGNOSIS — M797 Fibromyalgia: Secondary | ICD-10-CM | POA: Diagnosis not present

## 2017-11-19 DIAGNOSIS — Z96653 Presence of artificial knee joint, bilateral: Secondary | ICD-10-CM | POA: Diagnosis not present

## 2017-11-19 DIAGNOSIS — Z96643 Presence of artificial hip joint, bilateral: Secondary | ICD-10-CM | POA: Diagnosis not present

## 2017-11-19 DIAGNOSIS — G5600 Carpal tunnel syndrome, unspecified upper limb: Secondary | ICD-10-CM | POA: Diagnosis not present

## 2017-11-19 DIAGNOSIS — F329 Major depressive disorder, single episode, unspecified: Secondary | ICD-10-CM | POA: Diagnosis not present

## 2017-11-19 DIAGNOSIS — G2581 Restless legs syndrome: Secondary | ICD-10-CM | POA: Diagnosis not present

## 2017-11-19 DIAGNOSIS — K219 Gastro-esophageal reflux disease without esophagitis: Secondary | ICD-10-CM | POA: Diagnosis not present

## 2017-11-22 DIAGNOSIS — E210 Primary hyperparathyroidism: Secondary | ICD-10-CM | POA: Diagnosis not present

## 2017-11-23 DIAGNOSIS — G5600 Carpal tunnel syndrome, unspecified upper limb: Secondary | ICD-10-CM | POA: Diagnosis not present

## 2017-11-23 DIAGNOSIS — Z96643 Presence of artificial hip joint, bilateral: Secondary | ICD-10-CM | POA: Diagnosis not present

## 2017-11-23 DIAGNOSIS — F419 Anxiety disorder, unspecified: Secondary | ICD-10-CM | POA: Diagnosis not present

## 2017-11-23 DIAGNOSIS — G2581 Restless legs syndrome: Secondary | ICD-10-CM | POA: Diagnosis not present

## 2017-11-23 DIAGNOSIS — Z96653 Presence of artificial knee joint, bilateral: Secondary | ICD-10-CM | POA: Diagnosis not present

## 2017-11-23 DIAGNOSIS — F329 Major depressive disorder, single episode, unspecified: Secondary | ICD-10-CM | POA: Diagnosis not present

## 2017-11-23 DIAGNOSIS — Z7901 Long term (current) use of anticoagulants: Secondary | ICD-10-CM | POA: Diagnosis not present

## 2017-11-23 DIAGNOSIS — M797 Fibromyalgia: Secondary | ICD-10-CM | POA: Diagnosis not present

## 2017-11-23 DIAGNOSIS — Z471 Aftercare following joint replacement surgery: Secondary | ICD-10-CM | POA: Diagnosis not present

## 2017-11-23 DIAGNOSIS — K219 Gastro-esophageal reflux disease without esophagitis: Secondary | ICD-10-CM | POA: Diagnosis not present

## 2017-12-11 DIAGNOSIS — Z471 Aftercare following joint replacement surgery: Secondary | ICD-10-CM | POA: Diagnosis not present

## 2017-12-11 DIAGNOSIS — Z96641 Presence of right artificial hip joint: Secondary | ICD-10-CM | POA: Diagnosis not present

## 2018-04-23 DIAGNOSIS — E785 Hyperlipidemia, unspecified: Secondary | ICD-10-CM | POA: Diagnosis not present

## 2018-04-23 DIAGNOSIS — Z0000 Encounter for general adult medical examination without abnormal findings: Secondary | ICD-10-CM | POA: Diagnosis not present

## 2018-04-23 DIAGNOSIS — E559 Vitamin D deficiency, unspecified: Secondary | ICD-10-CM | POA: Diagnosis not present

## 2018-04-23 DIAGNOSIS — E210 Primary hyperparathyroidism: Secondary | ICD-10-CM | POA: Diagnosis not present

## 2018-04-23 DIAGNOSIS — R7309 Other abnormal glucose: Secondary | ICD-10-CM | POA: Diagnosis not present

## 2018-04-30 DIAGNOSIS — Z01419 Encounter for gynecological examination (general) (routine) without abnormal findings: Secondary | ICD-10-CM | POA: Diagnosis not present

## 2018-04-30 DIAGNOSIS — E559 Vitamin D deficiency, unspecified: Secondary | ICD-10-CM | POA: Diagnosis not present

## 2018-04-30 DIAGNOSIS — Z23 Encounter for immunization: Secondary | ICD-10-CM | POA: Diagnosis not present

## 2018-04-30 DIAGNOSIS — F419 Anxiety disorder, unspecified: Secondary | ICD-10-CM | POA: Diagnosis not present

## 2018-04-30 DIAGNOSIS — E7800 Pure hypercholesterolemia, unspecified: Secondary | ICD-10-CM | POA: Diagnosis not present

## 2018-04-30 DIAGNOSIS — Z1212 Encounter for screening for malignant neoplasm of rectum: Secondary | ICD-10-CM | POA: Diagnosis not present

## 2018-04-30 DIAGNOSIS — I10 Essential (primary) hypertension: Secondary | ICD-10-CM | POA: Diagnosis not present

## 2018-04-30 DIAGNOSIS — K219 Gastro-esophageal reflux disease without esophagitis: Secondary | ICD-10-CM | POA: Diagnosis not present

## 2018-04-30 DIAGNOSIS — Z0000 Encounter for general adult medical examination without abnormal findings: Secondary | ICD-10-CM | POA: Diagnosis not present

## 2018-08-25 ENCOUNTER — Other Ambulatory Visit: Payer: Self-pay

## 2018-08-25 ENCOUNTER — Emergency Department: Payer: Federal, State, Local not specified - PPO

## 2018-08-25 ENCOUNTER — Encounter: Payer: Self-pay | Admitting: Emergency Medicine

## 2018-08-25 DIAGNOSIS — Z96652 Presence of left artificial knee joint: Secondary | ICD-10-CM | POA: Diagnosis not present

## 2018-08-25 DIAGNOSIS — Z955 Presence of coronary angioplasty implant and graft: Secondary | ICD-10-CM | POA: Insufficient documentation

## 2018-08-25 DIAGNOSIS — I10 Essential (primary) hypertension: Secondary | ICD-10-CM | POA: Diagnosis not present

## 2018-08-25 DIAGNOSIS — R7303 Prediabetes: Secondary | ICD-10-CM | POA: Diagnosis not present

## 2018-08-25 DIAGNOSIS — R0981 Nasal congestion: Secondary | ICD-10-CM | POA: Diagnosis not present

## 2018-08-25 DIAGNOSIS — Z96642 Presence of left artificial hip joint: Secondary | ICD-10-CM | POA: Diagnosis not present

## 2018-08-25 DIAGNOSIS — Z96641 Presence of right artificial hip joint: Secondary | ICD-10-CM | POA: Diagnosis not present

## 2018-08-25 DIAGNOSIS — R05 Cough: Secondary | ICD-10-CM | POA: Diagnosis not present

## 2018-08-25 DIAGNOSIS — B9789 Other viral agents as the cause of diseases classified elsewhere: Secondary | ICD-10-CM | POA: Insufficient documentation

## 2018-08-25 DIAGNOSIS — Z79899 Other long term (current) drug therapy: Secondary | ICD-10-CM | POA: Insufficient documentation

## 2018-08-25 DIAGNOSIS — R0602 Shortness of breath: Secondary | ICD-10-CM | POA: Insufficient documentation

## 2018-08-25 DIAGNOSIS — Z96651 Presence of right artificial knee joint: Secondary | ICD-10-CM | POA: Diagnosis not present

## 2018-08-25 DIAGNOSIS — J069 Acute upper respiratory infection, unspecified: Secondary | ICD-10-CM | POA: Diagnosis not present

## 2018-08-25 DIAGNOSIS — Z7902 Long term (current) use of antithrombotics/antiplatelets: Secondary | ICD-10-CM | POA: Insufficient documentation

## 2018-08-25 DIAGNOSIS — R0989 Other specified symptoms and signs involving the circulatory and respiratory systems: Secondary | ICD-10-CM | POA: Insufficient documentation

## 2018-08-25 LAB — CBC WITH DIFFERENTIAL/PLATELET
Abs Immature Granulocytes: 0.03 10*3/uL (ref 0.00–0.07)
Basophils Absolute: 0.1 10*3/uL (ref 0.0–0.1)
Basophils Relative: 1 %
EOS ABS: 0.3 10*3/uL (ref 0.0–0.5)
Eosinophils Relative: 3 %
HCT: 47.7 % — ABNORMAL HIGH (ref 36.0–46.0)
Hemoglobin: 14.7 g/dL (ref 12.0–15.0)
Immature Granulocytes: 0 %
Lymphocytes Relative: 25 %
Lymphs Abs: 2.3 10*3/uL (ref 0.7–4.0)
MCH: 28.7 pg (ref 26.0–34.0)
MCHC: 30.8 g/dL (ref 30.0–36.0)
MCV: 93 fL (ref 80.0–100.0)
Monocytes Absolute: 0.8 10*3/uL (ref 0.1–1.0)
Monocytes Relative: 9 %
Neutro Abs: 5.7 10*3/uL (ref 1.7–7.7)
Neutrophils Relative %: 62 %
Platelets: 247 10*3/uL (ref 150–400)
RBC: 5.13 MIL/uL — ABNORMAL HIGH (ref 3.87–5.11)
RDW: 13.6 % (ref 11.5–15.5)
WBC: 9.2 10*3/uL (ref 4.0–10.5)
nRBC: 0 % (ref 0.0–0.2)

## 2018-08-25 NOTE — ED Triage Notes (Addendum)
Pt reports 3 days of cough, sinus congestion and sore throat; pt says cough is productive; blowing "clear or cloudy" out of her nose; chest discomfort only with cough-0/10 until coughs, then 8/10; denies shortness of breath; has not checked temp at home but has felt hot at times; pt alert and oriented x 3; talking in complete coherent sentences

## 2018-08-26 ENCOUNTER — Emergency Department
Admission: EM | Admit: 2018-08-26 | Discharge: 2018-08-26 | Disposition: A | Discharge status: Home / Self Care | Payer: Federal, State, Local not specified - PPO | Attending: Emergency Medicine | Admitting: Emergency Medicine

## 2018-08-26 DIAGNOSIS — J069 Acute upper respiratory infection, unspecified: Secondary | ICD-10-CM | POA: Diagnosis not present

## 2018-08-26 DIAGNOSIS — B9789 Other viral agents as the cause of diseases classified elsewhere: Secondary | ICD-10-CM

## 2018-08-26 LAB — INFLUENZA PANEL BY PCR (TYPE A & B)
Influenza A By PCR: NEGATIVE
Influenza B By PCR: NEGATIVE

## 2018-08-26 LAB — GROUP A STREP BY PCR: Group A Strep by PCR: NOT DETECTED

## 2018-08-26 MED ORDER — BENZONATATE 100 MG PO CAPS: 100.0000 mg | ORAL_CAPSULE | Freq: Three times a day (TID) | ORAL | 0 refills | Status: DC | PRN

## 2018-08-26 MED ORDER — ALBUTEROL SULFATE HFA 108 (90 BASE) MCG/ACT IN AERS: INHALATION_SPRAY | RESPIRATORY_TRACT | 1 refills | Status: DC

## 2018-08-26 MED ORDER — HYDROCODONE-CHLORPHENIRAMINE 5-4 MG/5ML PO SOLN: 5.0000 mL | Freq: Four times a day (QID) | ORAL | 0 refills | Status: DC | PRN

## 2018-08-26 MED ORDER — HYDROCOD POLST-CPM POLST ER 10-8 MG/5ML PO SUER
5.0000 mL | Freq: Once | ORAL | Status: AC
  Administered 2018-08-26: 5 mL via ORAL
  Filled 2018-08-26: qty 5

## 2018-08-26 NOTE — ED Provider Notes (Signed)
[facility] Emergency Department Provider Note  ____________________________________________   First MD Initiated Contact with Patient 08/26/18 0406     (approximate)  I have reviewed the triage vital signs and the nursing notes.   HISTORY  Chief Complaint Cough    HPI Alexandra Gibson is a 68 y.o. female with medical history as listed below who presents by private vehicle for evaluation of cough, nasal congestion, runny nose, and shortness of breath with coughing.  The symptoms have been present for about a week.  They were relatively acute in onset and have been persistent and she describes them as severe.  When she coughs she sometimes spits up.  She only feels short of breath when she coughs.  Initially she felt warm like she had a fever but that has improved.  +Sore throat.   She denies chest pain, nausea, vomiting, abdominal pain, and dysuria.  Nothing in particular makes her symptoms better and exertion makes them worse.  She has no history of tobacco use.  She does have at least one other family member that has been ill recently.  Past Medical History:  Diagnosis Date  . Anxiety   . Anxiety and depression   . Arthritis   . Depression   . Fibromyalgia   . GERD (gastroesophageal reflux disease)   . History of kidney stones   . History of urinary tract infection   . Hyperlipidemia   . Hypertension   . Kidney stone    10-2016; " i had a kidney stone that wouldnt move so they had to go in to take it out "  . Numbness    right hand/comes and goes   . Osteoarthritis of knee    bil-gets injections  . Parathyroid tumor   . Pre-diabetes   . Urinary frequency   . Wears glasses     Patient Active Problem List   Diagnosis Date Noted  . Left ureteral stone 10/11/2015  . Hydronephrosis of left kidney 10/11/2015  . Microscopic hematuria 10/11/2015  . OA (osteoarthritis) of hip 08/04/2015  . OA (osteoarthritis) of knee 06/29/2014  . BENIGN NEOPLASM OF  PARATHYROID GLAND 12/13/2007  . HYPERCALCEMIA 08/12/2007  . RESTLESS LEGS SYNDROME 08/12/2007  . CARPAL TUNNEL SYNDROME, BILATERAL 07/04/2007  . OSTEOARTHRITIS, MULTIPLE JOINTS 07/04/2007  . HYPERCHOLESTEROLEMIA 10/11/2006  . DEPRESSION, MAJOR, RECURRENT 10/11/2006  . HYPERTENSION, BENIGN SYSTEMIC 10/11/2006  . GASTROESOPHAGEAL REFLUX, NO ESOPHAGITIS 10/11/2006  . MENOPAUSAL SYNDROME 10/11/2006  . Myalgia and myositis 10/11/2006    Past Surgical History:  Procedure Laterality Date  . APPENDECTOMY    . CYSTOSCOPY WITH STENT PLACEMENT Left 10/27/2015   Procedure: CYSTOSCOPY WITH STENT PLACEMENT;  Surgeon: Nickie Retort, MD;  Location: [facility];  Service: Urology;  Laterality: Left;  . JOINT REPLACEMENT    . KNEE ARTHROSCOPY     left  . LAPAROSCOPIC ENDOMETRIOSIS FULGURATION  early 1970s  . TOTAL HIP ARTHROPLASTY Left 08/04/2015   Procedure: TOTAL LEFT  HIP ARTHROPLASTY ANTERIOR APPROACH;  Surgeon: Gaynelle Arabian, MD;  Location: [facility];  Service: Orthopedics;  Laterality: Left;  . TOTAL HIP ARTHROPLASTY Right 11/07/2017   Procedure: RIGHT TOTAL HIP ARTHROPLASTY ANTERIOR APPROACH;  Surgeon: Gaynelle Arabian, MD;  Location: [facility];  Service: Orthopedics;  Laterality: Right;  . TOTAL KNEE ARTHROPLASTY Left 06/29/2014   Procedure: TOTAL KNEE ARTHROPLASTY LEFT ;  Surgeon: Gearlean Alf, MD;  Location: [facility];  Service: Orthopedics;  Laterality: Left;  . TOTAL KNEE ARTHROPLASTY Right 09/18/2016  Procedure: RIGHT TOTAL KNEE ARTHROPLASTY;  Surgeon: Gaynelle Arabian, MD;  Location: [facility];  Service: Orthopedics;  Laterality: Right;  Adductor Block  . URETEROSCOPY WITH HOLMIUM LASER LITHOTRIPSY Left 10/27/2015   Procedure: URETEROSCOPY WITH HOLMIUM LASER LITHOTRIPSY;  Surgeon: Nickie Retort, MD;  Location: [facility];  Service: Urology;  Laterality: Left;    Prior to Admission medications   Medication Sig Start Date End Date Taking? Authorizing Provider  amLODipine (NORVASC) 5 MG tablet Take 5  mg by mouth daily.    Yes [provider]  fluticasone (FLONASE) 50 MCG/ACT nasal spray Place 1 spray into both nostrils daily as needed for allergies or rhinitis.   Yes [provider]  irbesartan (AVAPRO) 300 MG tablet Take 300 mg by mouth every morning.   Yes [provider]  olopatadine (PATANOL) 0.1 % ophthalmic solution Place 1 drop into both eyes daily as needed for allergies.   Yes [provider]  pravastatin (PRAVACHOL) 40 MG tablet Take 40 mg by mouth every evening.   Yes [provider]  albuterol (PROVENTIL HFA;VENTOLIN HFA) 108 (90 Base) MCG/ACT inhaler Inhale 2-4 puffs by mouth every 4 hours as needed for wheezing, cough, and/or shortness of breath 08/26/18   Hinda Kehr, MD  benzonatate (TESSALON PERLES) 100 MG capsule Take 1 capsule (100 mg total) by mouth 3 (three) times daily as needed for cough. 08/26/18   Hinda Kehr, MD  HYDROcodone-Chlorpheniramine 5-4 MG/5ML SOLN Take 5 mLs by mouth every 6 (six) hours as needed. 08/26/18   Hinda Kehr, MD    Allergies Atorvastatin  Family History  Problem Relation Age of Onset  . Hypertension Mother   . Colon cancer Neg Hx   . Kidney disease Neg Hx   . Bladder Cancer Neg Hx     Social History Social History   Tobacco Use  . Smoking status: Never Smoker  . Smokeless tobacco: Never Used  Substance Use Topics  . Alcohol use: No  . Drug use: No    Review of Systems Constitutional: Subjective fever/chills Eyes: No visual changes. ENT: +sore throat.  Nasal congestion/runny nose. Cardiovascular: Denies chest pain. Respiratory: Severe cough and shortness of breath only associated with cough. Gastrointestinal: No abdominal pain.  No nausea, no vomiting, sometimes spitting up after a coughing episode..  No diarrhea.  No constipation. Genitourinary: Negative for dysuria. Musculoskeletal: Negative for neck pain.  Negative for back pain. Integumentary: Negative for  rash. Neurological: Negative for headaches, focal weakness or numbness.   ____________________________________________   PHYSICAL EXAM:  VITAL SIGNS: ED Triage Vitals [08/25/18 2232]  Enc Vitals Group     BP (!) 159/84     Pulse Rate 97     Resp 18     Temp 99.8 F (37.7 C)     Temp Source Oral     SpO2 96 %     Weight 89.8 kg (198 lb)     Height 1.74 m (5' 8.5")     Head Circumference      Peak Flow      Pain Score 0     Pain Loc      Pain Edu?      Excl. in [place]?     Constitutional: Alert and oriented.  Generally well appearing and in no acute distress, but does appear to be suffering from viral respiratory symptoms. Eyes: Conjunctivae are normal.  Head: Atraumatic. Nose: Substantial congestion/rhinnorhea. Mouth/Throat: Mucous membranes are moist.  Oropharynx non-erythematous. Neck: No stridor.  No meningeal  signs.   Cardiovascular: Normal rate, regular rhythm. Good peripheral circulation. Grossly normal heart sounds. Respiratory: Normal respiratory effort.  No retractions. Lungs CTAB. Gastrointestinal: Soft and nontender. No distention.  Musculoskeletal: No lower extremity tenderness nor edema. No gross deformities of extremities. Neurologic:  Normal speech and language. No gross focal neurologic deficits are appreciated.  Skin:  Skin is warm, dry and intact. No rash noted. Psychiatric: Mood and affect are normal. Speech and behavior are normal.  ____________________________________________   LABS (all labs ordered are listed, but only abnormal results are displayed)  Labs Reviewed  CBC WITH DIFFERENTIAL/PLATELET - Abnormal; Notable for the following components:      Result Value   RBC 5.13 (*)    HCT 47.7 (*)    All other components within normal limits  GROUP A STREP BY PCR  INFLUENZA PANEL BY PCR (TYPE A & B)   ____________________________________________  EKG  None - EKG not ordered by ED  physician ____________________________________________  RADIOLOGY Ursula Alert, personally viewed and evaluated these images (plain radiographs) as part of my medical decision making, as well as reviewing the written report by the radiologist.  ED MD interpretation: No evidence of pneumonia  Official radiology report(s): Dg Chest 2 View  Result Date: 08/25/2018 CLINICAL DATA:  Three day history of cough, sinus congestion, and sore throat. EXAM: CHEST - 2 VIEW COMPARISON:  06/22/2014 FINDINGS: Normal heart size and pulmonary vascularity. No focal airspace disease or consolidation in the lungs. No blunting of costophrenic angles. No pneumothorax. Mediastinal contours appear intact. Thoracic scoliosis convex towards the right. Degenerative changes in the thoracic spine. Calcification of the aorta. IMPRESSION: No active cardiopulmonary disease. Electronically Signed   By: Lucienne Capers M.D.   On: 08/25/2018 23:47    ____________________________________________   PROCEDURES  Critical Care performed: No   Procedure(s) performed:   Procedures   ____________________________________________   INITIAL IMPRESSION / ASSESSMENT AND PLAN / ED COURSE  As part of my medical decision making, I reviewed the following data within the electronic MEDICAL RECORD NUMBER Notes from prior ED visits and [place] Controlled Substance Database    Differential diagnosis includes, but is not limited to, viral illness/bronchitis, community-acquired pneumonia, influenza, etc.  The patient is generally well-appearing but with obvious viral respiratory symptoms.  I explained to her that her lab work is reassuring, she is flu and strep negative, and there is no evidence of pneumonia on the chest x-ray.  I will prescribe medications as listed below to help her with her symptoms.  I encourage close outpatient follow-up and gave my usual customary return precautions.      ____________________________________________  FINAL CLINICAL IMPRESSION(S) / ED DIAGNOSES  Final diagnoses:  Viral URI with cough     MEDICATIONS GIVEN DURING THIS VISIT:  Medications  chlorpheniramine-HYDROcodone (TUSSIONEX) 10-8 MG/5ML suspension 5 mL (has no administration in time range)     ED Discharge Orders         Ordered    albuterol (PROVENTIL HFA;VENTOLIN HFA) 108 (90 Base) MCG/ACT inhaler     08/26/18 0442    HYDROcodone-Chlorpheniramine 5-4 MG/5ML SOLN  Every 6 hours PRN     08/26/18 0442    benzonatate (TESSALON PERLES) 100 MG capsule  3 times daily PRN     08/26/18 0442           Note:  This document was prepared using Dragon voice recognition software and may include unintentional dictation errors.    Hinda Kehr, MD 08/26/18 [phone]

## 2018-08-26 NOTE — Discharge Instructions (Signed)
You have been seen in the Emergency Department (ED) today for a likely viral illness.  Please drink plenty of clear fluids (water, Gatorade, chicken broth, etc).  You may use Tylenol and/or Motrin according to label instructions.  You can alternate between the two without any side effects.   Please use the prescribed medications as indicated on the medication instructions.  Please follow up with your doctor as listed above.  Call your doctor or return to the Emergency Department (ED) if you are unable to tolerate fluids due to vomiting, have worsening trouble breathing, become extremely tired or difficult to awaken, or if you develop any other symptoms that concern you.

## 2018-09-06 DIAGNOSIS — J069 Acute upper respiratory infection, unspecified: Secondary | ICD-10-CM | POA: Diagnosis not present

## 2018-10-29 DIAGNOSIS — E7800 Pure hypercholesterolemia, unspecified: Secondary | ICD-10-CM | POA: Diagnosis not present

## 2018-11-12 DIAGNOSIS — I10 Essential (primary) hypertension: Secondary | ICD-10-CM | POA: Diagnosis not present

## 2018-11-12 DIAGNOSIS — R32 Unspecified urinary incontinence: Secondary | ICD-10-CM | POA: Diagnosis not present

## 2018-11-12 DIAGNOSIS — K219 Gastro-esophageal reflux disease without esophagitis: Secondary | ICD-10-CM | POA: Diagnosis not present

## 2018-11-12 DIAGNOSIS — E785 Hyperlipidemia, unspecified: Secondary | ICD-10-CM | POA: Diagnosis not present

## 2018-11-12 DIAGNOSIS — R0683 Snoring: Secondary | ICD-10-CM | POA: Diagnosis not present

## 2018-11-12 DIAGNOSIS — F339 Major depressive disorder, recurrent, unspecified: Secondary | ICD-10-CM | POA: Diagnosis not present

## 2018-11-25 DIAGNOSIS — R7309 Other abnormal glucose: Secondary | ICD-10-CM | POA: Diagnosis not present

## 2018-11-25 DIAGNOSIS — E210 Primary hyperparathyroidism: Secondary | ICD-10-CM | POA: Diagnosis not present

## 2018-11-25 DIAGNOSIS — I129 Hypertensive chronic kidney disease with stage 1 through stage 4 chronic kidney disease, or unspecified chronic kidney disease: Secondary | ICD-10-CM | POA: Diagnosis not present

## 2018-11-25 DIAGNOSIS — E559 Vitamin D deficiency, unspecified: Secondary | ICD-10-CM | POA: Diagnosis not present

## 2018-12-16 ENCOUNTER — Ambulatory Visit (INDEPENDENT_AMBULATORY_CARE_PROVIDER_SITE_OTHER): Payer: Federal, State, Local not specified - PPO | Admitting: Internal Medicine

## 2018-12-16 ENCOUNTER — Other Ambulatory Visit: Payer: Self-pay

## 2018-12-16 ENCOUNTER — Encounter: Payer: Self-pay | Admitting: Internal Medicine

## 2018-12-16 VITALS — BP 120/70 | HR 71 | Ht 68.5 in | Wt 204.2 lb

## 2018-12-16 DIAGNOSIS — R0683 Snoring: Secondary | ICD-10-CM | POA: Diagnosis not present

## 2018-12-16 DIAGNOSIS — G4733 Obstructive sleep apnea (adult) (pediatric): Secondary | ICD-10-CM | POA: Diagnosis not present

## 2018-12-16 DIAGNOSIS — G2581 Restless legs syndrome: Secondary | ICD-10-CM | POA: Diagnosis not present

## 2018-12-16 NOTE — Progress Notes (Signed)
12/15/2018- 68 yoF never smoker for sleep evaluation Medical problem list includes HBP, GERD, Osteoarthritis, Restless Legs, Hypercholesterolemia, Hypercalcemia, Depression, Hydronephrosis L, Allergic Rhinitis -----referred by Dr. Shelia Media (PCP) for snoring, states her husband reports pt snores very loudly; was supposed to do a sleep study many years ago, but did not get one; states snoring interferes w/ sleeping along w/ chest "heaviness". Between 10 and 11 PM, sleep latency up to 1 hour, wakes 4 or 5 times per night before up at 8 AM. Separate bedrooms due to her loud snoring.  Restless sleeper, waking frequently. Minimal daytime sleepiness admitted.  Little caffeine.  No ENT surgery.  Denies heart or lung problems.  Prior to Admission medications   Medication Sig Start Date End Date Taking? Authorizing Provider  amLODipine (NORVASC) 5 MG tablet Take 5 mg by mouth daily.    Yes [provider]  celecoxib (CELEBREX) 200 MG capsule Take 200 mg by mouth daily.   Yes [provider]  cholecalciferol (VITAMIN D3) 25 MCG (1000 UT) tablet Take 1,000 Units by mouth daily.   Yes [provider]  fluticasone (FLONASE) 50 MCG/ACT nasal spray Place 1 spray into both nostrils daily as needed for allergies or rhinitis.   Yes [provider]  irbesartan (AVAPRO) 300 MG tablet Take 300 mg by mouth every morning.   Yes [provider]  Multiple Vitamin (MULTIVITAMIN WITH MINERALS) TABS tablet Take 1 tablet by mouth daily.   Yes [provider]  olopatadine (PATANOL) 0.1 % ophthalmic solution Place 1 drop into both eyes daily as needed for allergies.   Yes [provider]  pravastatin (PRAVACHOL) 40 MG tablet Take 40 mg by mouth every evening.   Yes [provider]  albuterol (PROVENTIL HFA;VENTOLIN HFA) 108 (90 Base) MCG/ACT inhaler Inhale 2-4 puffs by mouth every 4 hours as needed for wheezing, cough, and/or shortness of breath Patient not  taking: Reported on 12/16/2018 08/26/18   Hinda Kehr, MD  benzonatate (TESSALON PERLES) 100 MG capsule Take 1 capsule (100 mg total) by mouth 3 (three) times daily as needed for cough. Patient not taking: Reported on 12/16/2018 08/26/18   Hinda Kehr, MD  HYDROcodone-Chlorpheniramine 5-4 MG/5ML SOLN Take 5 mLs by mouth every 6 (six) hours as needed. Patient not taking: Reported on 12/16/2018 08/26/18   Hinda Kehr, MD   Past Medical History:  Diagnosis Date  . Anxiety   . Anxiety and depression   . Arthritis   . Depression   . Fibromyalgia   . GERD (gastroesophageal reflux disease)   . History of kidney stones   . History of urinary tract infection   . Hyperlipidemia   . Hypertension   . Kidney stone    10-2016; " i had a kidney stone that wouldnt move so they had to go in to take it out "  . Numbness    right hand/comes and goes   . Osteoarthritis of knee    bil-gets injections  . Parathyroid tumor   . Pre-diabetes   . Urinary frequency   . Wears glasses    Past Surgical History:  Procedure Laterality Date  . APPENDECTOMY    . CYSTOSCOPY WITH STENT PLACEMENT Left 10/27/2015   Procedure: CYSTOSCOPY WITH STENT PLACEMENT;  Surgeon: Nickie Retort, MD;  Location: [facility];  Service: Urology;  Laterality: Left;  . JOINT REPLACEMENT    . KNEE ARTHROSCOPY     left  . LAPAROSCOPIC ENDOMETRIOSIS FULGURATION  early 1970s  . TOTAL HIP  ARTHROPLASTY Left 08/04/2015   Procedure: TOTAL LEFT  HIP ARTHROPLASTY ANTERIOR APPROACH;  Surgeon: Gaynelle Arabian, MD;  Location: [facility];  Service: Orthopedics;  Laterality: Left;  . TOTAL HIP ARTHROPLASTY Right 11/07/2017   Procedure: RIGHT TOTAL HIP ARTHROPLASTY ANTERIOR APPROACH;  Surgeon: Gaynelle Arabian, MD;  Location: [facility];  Service: Orthopedics;  Laterality: Right;  . TOTAL KNEE ARTHROPLASTY Left 06/29/2014   Procedure: TOTAL KNEE ARTHROPLASTY LEFT ;  Surgeon: Gearlean Alf, MD;  Location: [facility];  Service: Orthopedics;  Laterality: Left;  .  TOTAL KNEE ARTHROPLASTY Right 09/18/2016   Procedure: RIGHT TOTAL KNEE ARTHROPLASTY;  Surgeon: Gaynelle Arabian, MD;  Location: [facility];  Service: Orthopedics;  Laterality: Right;  Adductor Block  . URETEROSCOPY WITH HOLMIUM LASER LITHOTRIPSY Left 10/27/2015   Procedure: URETEROSCOPY WITH HOLMIUM LASER LITHOTRIPSY;  Surgeon: Nickie Retort, MD;  Location: [facility];  Service: Urology;  Laterality: Left;   Family History  Problem Relation Age of Onset  . Hypertension Mother   . Colon cancer Neg Hx   . Kidney disease Neg Hx   . Bladder Cancer Neg Hx    Social History   Socioeconomic History  . Marital status: Married    Spouse name: Not on file  . Number of children: Not on file  . Years of education: Not on file  . Highest education level: Not on file  Occupational History  . Not on file  Social Needs  . Financial resource strain: Not on file  . Food insecurity:    Worry: Not on file    Inability: Not on file  . Transportation needs:    Medical: Not on file    Non-medical: Not on file  Tobacco Use  . Smoking status: Never Smoker  . Smokeless tobacco: Never Used  Substance and Sexual Activity  . Alcohol use: No  . Drug use: No  . Sexual activity: Not on file  Lifestyle  . Physical activity:    Days per week: Not on file    Minutes per session: Not on file  . Stress: Not on file  Relationships  . Social connections:    Talks on phone: Not on file    Gets together: Not on file    Attends religious service: Not on file    Active member of club or organization: Not on file    Attends meetings of clubs or organizations: Not on file    Relationship status: Not on file  . Intimate partner violence:    Fear of current or ex partner: Not on file    Emotionally abused: Not on file    Physically abused: Not on file    Forced sexual activity: Not on file  Other Topics Concern  . Not on file  Social History Narrative  . Not on file   ROS-see HPI   + =  positive Constitutional:    weight loss, night sweats, fevers, chills, fatigue, lassitude. HEENT:    headaches, difficulty swallowing, tooth/dental problems, sore throat,       sneezing, itching, ear ache, nasal congestion, post nasal drip, snoring CV:    chest pain, orthopnea, PND, swelling in lower extremities, anasarca,                                  dizziness, palpitations Resp:   shortness of breath with exertion or at rest.  productive cough,   non-productive cough, coughing up of blood.              change in color of mucus.  wheezing.   Skin:    rash or lesions. GI:  + heartburn, indigestion, abdominal pain, nausea, vomiting, diarrhea,                 change in bowel habits, loss of appetite GU: dysuria, change in color of urine, no urgency or frequency.   flank pain. MS:  + joint pain, stiffness, decreased range of motion, back pain. Neuro-     nothing unusual Psych:  change in mood or affect.  depression or anxiety.   memory loss.  OBJ- Physical Exam General- Alert, Oriented, Affect-appropriate, Distress- none acute, + obese Skin- rash-none, lesions- none, excoriation- none Lymphadenopathy- none Head- atraumatic            Eyes- Gross vision intact, PERRLA, conjunctivae and secretions clear            Ears- Hearing, canals-normal            Nose- Clear, no-Septal dev, mucus, polyps, erosion, perforation             Throat- Mallampati II-III , mucosa clear , drainage- none, tonsils- atrophic Neck- flexible , trachea midline, no stridor , thyroid nl, carotid no bruit Chest - symmetrical excursion , unlabored           Heart/CV- RRR , no murmur , no gallop  , no rub, nl s1 s2                           - JVD- none , edema- none, stasis changes- none, varices- none           Lung- clear to P&A, wheeze- none, cough- none , dullness-none, rub- none           Chest wall-  Abd-  Br/ Gen/ Rectal- Not done, not indicated Extrem- cyanosis- none, clubbing, none, atrophy-  none, strength- nl Neuro- grossly intact to observation

## 2018-12-16 NOTE — Patient Instructions (Signed)
Order- schedule unattended home sleep test   Dx OSA  Please call us for results and recommendations about 2 weeks after your sleep test.. If appropriate, we may be able to start treatment before we see you next.

## 2018-12-28 DIAGNOSIS — R0683 Snoring: Secondary | ICD-10-CM | POA: Insufficient documentation

## 2018-12-28 NOTE — Assessment & Plan Note (Signed)
She doesn't emphasize this sort of problem at this visit and is not on specific therapy.

## 2018-12-28 NOTE — Assessment & Plan Note (Signed)
History and exam suggest OSA. We discussed basic sleep hygiene, physiology, medical concerns and treatments for OSA, responsibility to be alert while driving. Plan- schedule sleep study

## 2019-01-03 ENCOUNTER — Ambulatory Visit: Payer: Federal, State, Local not specified - PPO

## 2019-01-03 ENCOUNTER — Other Ambulatory Visit: Payer: Self-pay

## 2019-01-03 DIAGNOSIS — G4733 Obstructive sleep apnea (adult) (pediatric): Secondary | ICD-10-CM

## 2019-01-05 DIAGNOSIS — G4733 Obstructive sleep apnea (adult) (pediatric): Secondary | ICD-10-CM | POA: Diagnosis not present

## 2019-01-15 DIAGNOSIS — G4733 Obstructive sleep apnea (adult) (pediatric): Secondary | ICD-10-CM | POA: Diagnosis not present

## 2019-01-17 DIAGNOSIS — Z96641 Presence of right artificial hip joint: Secondary | ICD-10-CM | POA: Diagnosis not present

## 2019-01-17 DIAGNOSIS — Z471 Aftercare following joint replacement surgery: Secondary | ICD-10-CM | POA: Diagnosis not present

## 2019-01-27 ENCOUNTER — Telehealth: Payer: Self-pay | Admitting: Internal Medicine

## 2019-01-27 DIAGNOSIS — G4733 Obstructive sleep apnea (adult) (pediatric): Secondary | ICD-10-CM

## 2019-01-27 NOTE — Telephone Encounter (Signed)
The home sleep tesst showed severe obstructive sleep apnea, averaging 37 apneas per hour, with drops in blood oxygen level. I recommend we try CPAP therapy.  Order- new DME, new CPAP auto 5-15, mask of choice, humidifier, supplies, AirView/ card  Please make sure she has a return ov in 31-90 days per insurance requirements. She may already have one in that interval.

## 2019-01-27 NOTE — Telephone Encounter (Signed)
Pt calling requesting to know the results of HST which was performed 5/24. Dr. Annamaria Boots, please advise on this for pt. Thanks!

## 2019-01-27 NOTE — Telephone Encounter (Signed)
Called & spoke w/ pt regarding these results and recommendations. Pt verbalized understanding and agreed to new DME, new CPAP auto 5-15, mask of choice, humidifier, supplies, AirView/ card.   Orders have been placed. Nothing further needed at this time.

## 2019-01-28 DIAGNOSIS — G4733 Obstructive sleep apnea (adult) (pediatric): Secondary | ICD-10-CM | POA: Diagnosis not present

## 2019-01-29 DIAGNOSIS — N3946 Mixed incontinence: Secondary | ICD-10-CM | POA: Diagnosis not present

## 2019-01-29 DIAGNOSIS — N952 Postmenopausal atrophic vaginitis: Secondary | ICD-10-CM | POA: Diagnosis not present

## 2019-02-10 DIAGNOSIS — K080 Exfoliation of teeth due to systemic causes: Secondary | ICD-10-CM | POA: Diagnosis not present

## 2019-02-27 DIAGNOSIS — G4733 Obstructive sleep apnea (adult) (pediatric): Secondary | ICD-10-CM | POA: Diagnosis not present

## 2019-03-17 NOTE — Progress Notes (Signed)
@Patient  ID: Alexandra Gibson, female    DOB: Sep 01, 1950, 68 y.o.   MRN: [id number]  Chief Complaint  Patient presents with  . Follow-up    OSA - CPAP follow up    Referring provider: Deland Pretty, MD  HPI:  68 year old female never smoker initially referred to our office on 12/15/2018 for sleep evaluation  PMH: High blood pressure, GERD, osteoarthritis, restless legs, hypercholesteremia, hypercalcemia, depression, hydronephrosis left, allergic rhinitis Smoker/ Smoking History: Never smoker Maintenance:  None Pt of: Dr. Annamaria Boots  03/18/2019  - Visit   68 year old female never smoker followed in our office for severe obstructive sleep apnea.  Patient completed a sleep study in May/2020 which showed an AHI of 37.4 and patient was started on CPAP therapy.  Patient presents today after starting CPAP therapy to show compliance.  CPAP compliance report today shows excellent compliance as well as a well-controlled AHI.  See compliance report listed below:  01/16/2019- 03/16/2019-CPAP compliance report-40 and the last 60 days use, all 48 of those days greater than 4 hours, average usage 8 hours and 37 minutes, APAP setting 5-15, AHI 3.8  Patient reports that she feels that the CPAP is going well.  She admits this is something that she is having to get used to but she is working on this daily. Pt is sleeping better, daytime sleepiness has improved as well.  Overall patient feels that the CPAP is helping with her sleep management.   Tests:   Results of the Epworth flowsheet 12/16/2018  Sitting and reading 0  Watching TV 3  Sitting, inactive in a public place (e.g. a theatre or a meeting) 0  As a passenger in a car for an hour without a break 0  Lying down to rest in the afternoon when circumstances permit 3  Sitting and talking to someone 0  Sitting quietly after a lunch without alcohol 1  In a car, while stopped for a few minutes in traffic 0  Total score 7   01/05/2019-home sleep study AHI of  37.4, SaO2 low 67%  FENO:  No results found for: NITRICOXIDE  PFT: No flowsheet data found.  Imaging: No results found.    Specialty Problems      Pulmonary Problems   Snoring   OSA (obstructive sleep apnea)    01/05/2019-home sleep study AHI of 37.4, SaO2 low 67%         Allergies  Allergen Reactions  . Atorvastatin Other (See Comments)    REACTION: leg cramp    Immunization History  Administered Date(s) Administered  . Influenza Whole 07/04/2007  . Influenza,inj,quad, With Preservative 04/14/2017  . Influenza-Unspecified 04/17/2018  . Td 08/15/1999    Past Medical History:  Diagnosis Date  . Anxiety   . Anxiety and depression   . Arthritis   . Depression   . Fibromyalgia   . GERD (gastroesophageal reflux disease)   . History of kidney stones   . History of urinary tract infection   . Hyperlipidemia   . Hypertension   . Kidney stone    10-2016; " i had a kidney stone that wouldnt move so they had to go in to take it out "  . Numbness    right hand/comes and goes   . Osteoarthritis of knee    bil-gets injections  . Parathyroid tumor   . Pre-diabetes   . Urinary frequency   . Wears glasses     Tobacco History: Social History   Tobacco Use  Smoking  Status Never Smoker  Smokeless Tobacco Never Used   Counseling given: Yes  Continue to not smoke  Outpatient Encounter Medications as of 03/18/2019  Medication Sig  . amLODipine (NORVASC) 5 MG tablet Take 5 mg by mouth daily.   . benzonatate (TESSALON PERLES) 100 MG capsule Take 1 capsule (100 mg total) by mouth 3 (three) times daily as needed for cough.  . celecoxib (CELEBREX) 200 MG capsule Take 200 mg by mouth daily.  . cholecalciferol (VITAMIN D3) 25 MCG (1000 UT) tablet Take 1,000 Units by mouth daily.  . fluticasone (FLONASE) 50 MCG/ACT nasal spray Place 1 spray into both nostrils daily as needed for allergies or rhinitis.  Marland Kitchen HYDROcodone-Chlorpheniramine 5-4 MG/5ML SOLN Take 5 mLs by mouth  every 6 (six) hours as needed.  . irbesartan (AVAPRO) 300 MG tablet Take 300 mg by mouth every morning.  . Multiple Vitamin (MULTIVITAMIN WITH MINERALS) TABS tablet Take 1 tablet by mouth daily.  Marland Kitchen olopatadine (PATANOL) 0.1 % ophthalmic solution Place 1 drop into both eyes daily as needed for allergies.  . pravastatin (PRAVACHOL) 40 MG tablet Take 40 mg by mouth every evening.  Marland Kitchen albuterol (PROVENTIL HFA;VENTOLIN HFA) 108 (90 Base) MCG/ACT inhaler Inhale 2-4 puffs by mouth every 4 hours as needed for wheezing, cough, and/or shortness of breath (Patient not taking: Reported on 12/16/2018)   No facility-administered encounter medications on file as of 03/18/2019.      Review of Systems  Review of Systems  Constitutional: Negative for activity change, fatigue and fever.  HENT: Negative for sinus pressure, sinus pain and sore throat.   Respiratory: Positive for cough (Occasional with clear phlegm, patient attributes this to allergies and change of seasons). Negative for shortness of breath and wheezing.   Cardiovascular: Negative for chest pain and palpitations.  Gastrointestinal: Negative for diarrhea, nausea and vomiting.  Musculoskeletal: Negative for arthralgias.  Neurological: Negative for dizziness.  Psychiatric/Behavioral: Negative for sleep disturbance. The patient is not nervous/anxious.      Physical Exam  BP 128/82 (BP Location: Left Arm, Cuff Size: Normal)   Pulse 60   SpO2 97%   Wt Readings from Last 5 Encounters:  12/16/18 204 lb 3.2 oz (92.6 kg)  08/25/18 198 lb (89.8 kg)  11/07/17 199 lb (90.3 kg)  10/30/17 199 lb 9.6 oz (90.5 kg)  09/18/16 190 lb (86.2 kg)     Physical Exam Vitals signs and nursing note reviewed.  Constitutional:      General: She is not in acute distress.    Appearance: Normal appearance. She is obese.  HENT:     Head: Normocephalic and atraumatic.     Right Ear: Tympanic membrane, ear canal and external ear normal. There is no impacted  cerumen.     Left Ear: Tympanic membrane, ear canal and external ear normal. There is no impacted cerumen.     Nose: Rhinorrhea present. No congestion.     Mouth/Throat:     Mouth: Mucous membranes are moist.     Pharynx: Oropharynx is clear.     Comments: Mallampati 2 Eyes:     Pupils: Pupils are equal, round, and reactive to light.  Neck:     Musculoskeletal: Normal range of motion.  Cardiovascular:     Rate and Rhythm: Normal rate and regular rhythm.     Pulses: Normal pulses.     Heart sounds: Normal heart sounds. No murmur.  Pulmonary:     Effort: Pulmonary effort is normal. No respiratory distress.  Breath sounds: Normal breath sounds. No decreased air movement. No decreased breath sounds, wheezing or rales.  Skin:    General: Skin is warm and dry.     Capillary Refill: Capillary refill takes less than 2 seconds.  Neurological:     General: No focal deficit present.     Mental Status: She is alert and oriented to person, place, and time. Mental status is at baseline.     Gait: Gait normal.  Psychiatric:        Mood and Affect: Mood normal.        Behavior: Behavior normal.        Thought Content: Thought content normal.        Judgment: Judgment normal.      Lab Results:  CBC    Component Value Date/Time   WBC 9.2 08/25/2018 2249   RBC 5.13 (H) 08/25/2018 2249   HGB 14.7 08/25/2018 2249   HCT 47.7 (H) 08/25/2018 2249   PLT 247 08/25/2018 2249   MCV 93.0 08/25/2018 2249   MCH 28.7 08/25/2018 2249   MCHC 30.8 08/25/2018 2249   RDW 13.6 08/25/2018 2249   LYMPHSABS 2.3 08/25/2018 2249   MONOABS 0.8 08/25/2018 2249   EOSABS 0.3 08/25/2018 2249   BASOSABS 0.1 08/25/2018 2249    BMET    Component Value Date/Time   NA 138 11/08/2017 0529   K 4.8 11/08/2017 0529   CL 102 11/08/2017 0529   CO2 28 11/08/2017 0529   GLUCOSE 139 (H) 11/08/2017 0529   BUN 12 11/08/2017 0529   CREATININE 0.77 11/08/2017 0529   CALCIUM 9.9 11/08/2017 0529   GFRNONAA >60  11/08/2017 0529   GFRAA >60 11/08/2017 0529    BNP No results found for: BNP  ProBNP No results found for: PROBNP    Assessment & Plan:   OSA (obstructive sleep apnea) Well-controlled events on CPAP compliance report Adequate compliance on CPAP compliance report Patient reports that CPAP management is going well  Plan: Continue CPAP therapy as prescribed Follow-up in 1 year with Dr. Annamaria Boots     Return in about 1 year (around 03/17/2020), or if symptoms worsen or fail to improve, for Follow up with Dr. Annamaria Boots.   Lauraine Rinne, NP 03/18/2019   This appointment was 18 minutes long with over 50% of the time in direct face-to-face patient care, assessment, plan of care, and follow-up.

## 2019-03-18 ENCOUNTER — Encounter: Payer: Self-pay | Admitting: Pulmonary Disease

## 2019-03-18 ENCOUNTER — Other Ambulatory Visit: Payer: Self-pay

## 2019-03-18 ENCOUNTER — Ambulatory Visit (INDEPENDENT_AMBULATORY_CARE_PROVIDER_SITE_OTHER): Payer: Federal, State, Local not specified - PPO | Admitting: Pulmonary Disease

## 2019-03-18 VITALS — BP 128/82 | HR 60 | Temp 97.7°F | Ht 68.0 in | Wt 210.0 lb

## 2019-03-18 DIAGNOSIS — G4733 Obstructive sleep apnea (adult) (pediatric): Secondary | ICD-10-CM | POA: Diagnosis not present

## 2019-03-18 NOTE — Addendum Note (Signed)
Addended by: Hildred Alamin I on: 03/18/2019 09:50 AM   Modules accepted: Orders

## 2019-03-18 NOTE — Patient Instructions (Addendum)
We recommend that you continue using your CPAP daily >>>Keep up the hard work using your device >>> Goal should be wearing this for the entire night that you are sleeping, at least 4 to 6 hours  Remember:   Do not drive or operate heavy machinery if tired or drowsy.   Please notify the supply company and office if you are unable to use your device regularly due to missing supplies or machine being broken.   Work on maintaining a healthy weight and following your recommended nutrition plan   Maintain proper daily exercise and movement   Maintaining proper use of your device can also help improve management of other chronic illnesses such as: Blood pressure, blood sugars, and weight management.   BiPAP/ CPAP Cleaning:  >>>Clean weekly, with Dawn soap, and bottle brush.  Set up to air dry.   Return in about 1 year (around 03/17/2020), or if symptoms worsen or fail to improve, for Follow up with Dr. Annamaria Boots.    Living With Sleep Apnea Sleep apnea is a condition in which breathing pauses or becomes shallow during sleep. Sleep apnea is most commonly caused by a collapsed or blocked airway. People with sleep apnea snore loudly and have times when they gasp and stop breathing for 10 seconds or more during sleep. This happens over and over during the night. This disrupts your sleep and keeps your body from getting the rest that it needs, which can cause tiredness and lack of energy (fatigue) during the day. The breaks in breathing also interrupt the deep sleep that you need to feel rested. Even if you do not completely wake up from the gaps in breathing, your sleep may not be restful. You may also have a headache in the morning and low energy during the day, and you may feel anxious or depressed. How can sleep apnea affect me? Sleep apnea increases your chances of extreme tiredness during the day (daytime fatigue). It can also increase your risk for health conditions, such as:  Heart  attack.  Stroke.  Diabetes.  Heart failure.  Irregular heartbeat.  High blood pressure. If you have daytime fatigue as a result of sleep apnea, you may be more likely to:  Perform poorly at school or work.  Fall asleep while driving.  Have difficulty with attention.  Develop depression or anxiety.  Become severely overweight (obese).  Have sexual dysfunction. What actions can I take to manage sleep apnea? Sleep apnea treatment   If you were given a device to open your airway while you sleep, use it only as told by your health care provider. You may be given: ? An oral appliance. This is a custom-made mouthpiece that shifts your lower jaw forward. ? A continuous positive airway pressure (CPAP) device. This device blows air through a mask when you breathe out (exhale). ? A nasal expiratory positive airway pressure (EPAP) device. This device has valves that you put into each nostril. ? A bi-level positive airway pressure (BPAP) device. This device blows air through a mask when you breathe in (inhale) and breathe out (exhale).  You may need surgery if other treatments do not work for you. Sleep habits  Go to sleep and wake up at the same time every day. This helps set your internal clock (circadian rhythm) for sleeping. ? If you stay up later than usual, such as on weekends, try to get up in the morning within 2 hours of your normal wake time.  Try to get at  least 7-9 hours of sleep each night.  Stop computer, tablet, and mobile phone use a few hours before bedtime.  Do not take long naps during the day. If you nap, limit it to 30 minutes.  Have a relaxing bedtime routine. Reading or listening to music may relax you and help you sleep.  Use your bedroom only for sleep. ? Keep your television and computer out of your bedroom. ? Keep your bedroom cool, dark, and quiet. ? Use a supportive mattress and pillows.  Follow your health care provider's instructions for other  changes to sleep habits. Nutrition  Do not eat heavy meals in the evening.  Do not have caffeine in the later part of the day. The effects of caffeine can last for more than 5 hours.  Follow your health care provider's or dietitian's instructions for any diet changes. Lifestyle      Do not drink alcohol before bedtime. Alcohol can cause you to fall asleep at first, but then it can cause you to wake up in the middle of the night and have trouble getting back to sleep.  Do not use any products that contain nicotine or tobacco, such as cigarettes and e-cigarettes. If you need help quitting, ask your health care provider. Medicines  Take over-the-counter and prescription medicines only as told by your health care provider.  Do not use over-the-counter sleep medicine. You can become dependent on this medicine, and it can make sleep apnea worse.  Do not use medicines, such as sedatives and narcotics, unless told by your health care provider. Activity  Exercise on most days, but avoid exercising in the evening. Exercising near bedtime can interfere with sleeping.  If possible, spend time outside every day. Natural light helps regulate your circadian rhythm. General information  Lose weight if you need to, and maintain a healthy weight.  Keep all follow-up visits as told by your health care provider. This is important.  If you are having surgery, make sure to tell your health care provider that you have sleep apnea. You may need to bring your device with you. Where to find more information Learn more about sleep apnea and daytime fatigue from:  American Sleep Association: sleepassociation.[place]: sleepfoundation.org  National Heart, Lung, and Blood Institute: https://www.[place].biz/ Summary  Sleep apnea can cause daytime fatigue and other serious health conditions.  Both sleep apnea and daytime fatigue can be bad for your health and well-being.  You may need to  wear a device while sleeping to help keep your airway open.  If you are having surgery, make sure to tell your health care provider that you have sleep apnea. You may need to bring your device with you.  Making changes to sleep habits, diet, lifestyle, and activity can help you manage sleep apnea. This information is not intended to replace advice given to you by your health care provider. Make sure you discuss any questions you have with your health care provider. Document Released: 10/25/2017 Document Revised: 11/22/2018 Document Reviewed: 10/25/2017 Elsevier Patient Education  [place].    CPAP and BPAP Information CPAP and BPAP are methods of helping a person breathe with the use of air pressure. CPAP stands for "continuous positive airway pressure." BPAP stands for "bi-level positive airway pressure." In both methods, air is blown through your nose or mouth and into your air passages to help you breathe well. CPAP and BPAP use different amounts of pressure to blow air. With CPAP, the amount of  pressure stays the same while you breathe in and out. With BPAP, the amount of pressure is increased when you breathe in (inhale) so that you can take larger breaths. Your health care provider will recommend whether CPAP or BPAP would be more helpful for you. Why are CPAP and BPAP treatments used? CPAP or BPAP can be helpful if you have:  Sleep apnea.  Chronic obstructive pulmonary disease (COPD).  Heart failure.  Medical conditions that weaken the muscles of the chest including muscular dystrophy, or neurological diseases such as amyotrophic lateral sclerosis (ALS).  Other problems that cause breathing to be weak, abnormal, or difficult. CPAP is most commonly used for obstructive sleep apnea (OSA) to keep the airways from collapsing when the muscles relax during sleep. How is CPAP or BPAP administered? Both CPAP and BPAP are provided by a small machine with a flexible plastic tube  that attaches to a plastic mask. You wear the mask. Air is blown through the mask into your nose or mouth. The amount of pressure that is used to blow the air can be adjusted on the machine. Your health care provider will determine the pressure setting that should be used based on your individual needs. When should CPAP or BPAP be used? In most cases, the mask only needs to be worn during sleep. Generally, the mask needs to be worn throughout the night and during any daytime naps. People with certain medical conditions may also need to wear the mask at other times when they are awake. Follow instructions from your health care provider about when to use the machine. What are some tips for using the mask?   Because the mask needs to be snug, some people feel trapped or closed-in (claustrophobic) when first using the mask. If you feel this way, you may need to get used to the mask. One way to do this is by holding the mask loosely over your nose or mouth and then gradually applying the mask more snugly. You can also gradually increase the amount of time that you use the mask.  Masks are available in various types and sizes. Some fit over your mouth and nose while others fit over just your nose. If your mask does not fit well, talk with your health care provider about getting a different one.  If you are using a mask that fits over your nose and you tend to breathe through your mouth, a chin strap may be applied to help keep your mouth closed.  The CPAP and BPAP machines have alarms that may sound if the mask comes off or develops a leak.  If you have trouble with the mask, it is very important that you talk with your health care provider about finding a way to make the mask easier to tolerate. Do not stop using the mask. Stopping the use of the mask could have a negative impact on your health. What are some tips for using the machine?  Place your CPAP or BPAP machine on a secure table or stand near an  electrical outlet.  Know where the on/off switch is located on the machine.  Follow instructions from your health care provider about how to set the pressure on your machine and when you should use it.  Do not eat or drink while the CPAP or BPAP machine is on. Food or fluids could get pushed into your lungs by the pressure of the CPAP or BPAP.  Do not smoke. Tobacco smoke residue can damage the  machine.  For home use, CPAP and BPAP machines can be rented or purchased through home health care companies. Many different brands of machines are available. Renting a machine before purchasing may help you find out which particular machine works well for you.  Keep the CPAP or BPAP machine and attachments clean. Ask your health care provider for specific instructions. Get help right away if:  You have redness or open areas around your nose or mouth where the mask fits.  You have trouble using the CPAP or BPAP machine.  You cannot tolerate wearing the CPAP or BPAP mask.  You have pain, discomfort, and bloating in your abdomen. Summary  CPAP and BPAP are methods of helping a person breathe with the use of air pressure.  Both CPAP and BPAP are provided by a small machine with a flexible plastic tube that attaches to a plastic mask.  If you have trouble with the mask, it is very important that you talk with your health care provider about finding a way to make the mask easier to tolerate. This information is not intended to replace advice given to you by your health care provider. Make sure you discuss any questions you have with your health care provider. Document Released: 04/28/2004 Document Revised: 11/20/2018 Document Reviewed: 06/19/2016 Elsevier Patient Education  2020 [employer].

## 2019-03-18 NOTE — Assessment & Plan Note (Signed)
Well-controlled events on CPAP compliance report Adequate compliance on CPAP compliance report Patient reports that CPAP management is going well  Plan: Continue CPAP therapy as prescribed Follow-up in 1 year with Dr. Annamaria Boots

## 2019-03-30 DIAGNOSIS — G4733 Obstructive sleep apnea (adult) (pediatric): Secondary | ICD-10-CM | POA: Diagnosis not present

## 2019-04-28 DIAGNOSIS — I129 Hypertensive chronic kidney disease with stage 1 through stage 4 chronic kidney disease, or unspecified chronic kidney disease: Secondary | ICD-10-CM | POA: Diagnosis not present

## 2019-04-28 DIAGNOSIS — R7309 Other abnormal glucose: Secondary | ICD-10-CM | POA: Diagnosis not present

## 2019-04-28 DIAGNOSIS — E210 Primary hyperparathyroidism: Secondary | ICD-10-CM | POA: Diagnosis not present

## 2019-04-28 DIAGNOSIS — E559 Vitamin D deficiency, unspecified: Secondary | ICD-10-CM | POA: Diagnosis not present

## 2019-04-30 DIAGNOSIS — G4733 Obstructive sleep apnea (adult) (pediatric): Secondary | ICD-10-CM | POA: Diagnosis not present

## 2019-05-06 DIAGNOSIS — G4733 Obstructive sleep apnea (adult) (pediatric): Secondary | ICD-10-CM | POA: Diagnosis not present

## 2019-05-06 DIAGNOSIS — F419 Anxiety disorder, unspecified: Secondary | ICD-10-CM | POA: Diagnosis not present

## 2019-05-06 DIAGNOSIS — K219 Gastro-esophageal reflux disease without esophagitis: Secondary | ICD-10-CM | POA: Diagnosis not present

## 2019-05-06 DIAGNOSIS — Z9989 Dependence on other enabling machines and devices: Secondary | ICD-10-CM | POA: Diagnosis not present

## 2019-05-06 DIAGNOSIS — Z0000 Encounter for general adult medical examination without abnormal findings: Secondary | ICD-10-CM | POA: Diagnosis not present

## 2019-05-06 DIAGNOSIS — E785 Hyperlipidemia, unspecified: Secondary | ICD-10-CM | POA: Diagnosis not present

## 2019-05-06 DIAGNOSIS — I129 Hypertensive chronic kidney disease with stage 1 through stage 4 chronic kidney disease, or unspecified chronic kidney disease: Secondary | ICD-10-CM | POA: Diagnosis not present

## 2019-05-06 DIAGNOSIS — R7309 Other abnormal glucose: Secondary | ICD-10-CM | POA: Diagnosis not present

## 2019-05-06 DIAGNOSIS — I10 Essential (primary) hypertension: Secondary | ICD-10-CM | POA: Diagnosis not present

## 2019-05-06 DIAGNOSIS — M62830 Muscle spasm of back: Secondary | ICD-10-CM | POA: Diagnosis not present

## 2019-05-28 ENCOUNTER — Emergency Department: Payer: Federal, State, Local not specified - PPO

## 2019-05-28 ENCOUNTER — Emergency Department
Admission: EM | Admit: 2019-05-28 | Discharge: 2019-05-28 | Disposition: A | Discharge status: Home / Self Care | Payer: Federal, State, Local not specified - PPO | Attending: Emergency Medicine | Admitting: Emergency Medicine

## 2019-05-28 ENCOUNTER — Other Ambulatory Visit: Payer: Self-pay

## 2019-05-28 DIAGNOSIS — M549 Dorsalgia, unspecified: Secondary | ICD-10-CM | POA: Diagnosis not present

## 2019-05-28 DIAGNOSIS — M546 Pain in thoracic spine: Secondary | ICD-10-CM

## 2019-05-28 DIAGNOSIS — I10 Essential (primary) hypertension: Secondary | ICD-10-CM | POA: Diagnosis not present

## 2019-05-28 DIAGNOSIS — M545 Low back pain: Secondary | ICD-10-CM | POA: Diagnosis not present

## 2019-05-28 DIAGNOSIS — Z79899 Other long term (current) drug therapy: Secondary | ICD-10-CM | POA: Diagnosis not present

## 2019-05-28 DIAGNOSIS — Z96653 Presence of artificial knee joint, bilateral: Secondary | ICD-10-CM | POA: Insufficient documentation

## 2019-05-28 DIAGNOSIS — Z96643 Presence of artificial hip joint, bilateral: Secondary | ICD-10-CM | POA: Diagnosis not present

## 2019-05-28 DIAGNOSIS — M47816 Spondylosis without myelopathy or radiculopathy, lumbar region: Secondary | ICD-10-CM | POA: Diagnosis not present

## 2019-05-28 LAB — URINALYSIS, COMPLETE (UACMP) WITH MICROSCOPIC
Bilirubin Urine: NEGATIVE
Glucose, UA: NEGATIVE mg/dL
Hgb urine dipstick: NEGATIVE
Ketones, ur: 5 mg/dL — AB
Leukocytes,Ua: NEGATIVE
Nitrite: NEGATIVE
Protein, ur: 30 mg/dL — AB
Specific Gravity, Urine: 1.021 (ref 1.005–1.030)
pH: 6 (ref 5.0–8.0)

## 2019-05-28 MED ORDER — METHOCARBAMOL 500 MG PO TABS: 500.0000 mg | ORAL_TABLET | Freq: Four times a day (QID) | ORAL | 0 refills | Status: DC | PRN

## 2019-05-28 MED ORDER — KETOROLAC TROMETHAMINE 30 MG/ML IJ SOLN
30.0000 mg | Freq: Once | INTRAMUSCULAR | Status: AC
  Administered 2019-05-28: 30 mg via INTRAMUSCULAR
  Filled 2019-05-28: qty 1

## 2019-05-28 NOTE — ED Triage Notes (Signed)
Pt c/o left lower back pain for the past 3-4 days, denies injury. States she takes tylenol arthritis and gets minimal relief.

## 2019-05-28 NOTE — Discharge Instructions (Signed)
Follow-up with your primary care provider if any continued problems or need for continued pain medication.  Began your Celebrex twice a day with food until your back is better then you can return to once a day dosing.  Also be aware that the muscle relaxant could cause drowsiness and increase your risk for falling.  The Robaxin can be taken every 6 hours if needed.  You may use ice or heat to your back as needed for discomfort.

## 2019-05-28 NOTE — ED Notes (Signed)
NAD noted at time of D/C. Pt taken to lobby via wheelchair by this RN. Pt taken to vehicle via courtesy car. Pt denies comments/concerns regarding D/C instructions.

## 2019-05-28 NOTE — ED Provider Notes (Signed)
[facility] Emergency Department Provider Note   ____________________________________________   First MD Initiated Contact with Patient 05/28/19 1217     (approximate)  I have reviewed the triage vital signs and the nursing notes.   HISTORY  Chief Complaint Back Pain   HPI Alexandra Gibson is a 68 y.o. female presents to the ED with complaint of left sided back pain for the last 3 to 4 days.  Patient denies any injury.  She states she has been taking Tylenol arthritis with minimal relief.  She denies any paresthesias into her lower extremities, saddle anesthesias or incontinence of bowel or bladder.  Patient denies any previous injury to her back.  She does have a history of UTIs and kidney stones.  She denies any fever, chills, nausea or vomiting.  Pain is increased with range of motion.  Patient drove herself to the ED.  She rates her pain as an 8 out of 10.       Past Medical History:  Diagnosis Date   Anxiety    Anxiety and depression    Arthritis    Depression    Fibromyalgia    GERD (gastroesophageal reflux disease)    History of kidney stones    History of urinary tract infection    Hyperlipidemia    Hypertension    Kidney stone    10-2016; " i had a kidney stone that wouldnt move so they had to go in to take it out "   Numbness    right hand/comes and goes    Osteoarthritis of knee    bil-gets injections   Parathyroid tumor    Pre-diabetes    Urinary frequency    Wears glasses     Patient Active Problem List   Diagnosis Date Noted   OSA (obstructive sleep apnea) 03/18/2019   Snoring 12/28/2018   Left ureteral stone 10/11/2015   Hydronephrosis of left kidney 10/11/2015   Microscopic hematuria 10/11/2015   OA (osteoarthritis) of hip 08/04/2015   OA (osteoarthritis) of knee 06/29/2014   BENIGN NEOPLASM OF PARATHYROID GLAND 12/13/2007   HYPERCALCEMIA 08/12/2007   RESTLESS LEGS SYNDROME 08/12/2007   CARPAL  TUNNEL SYNDROME, BILATERAL 07/04/2007   OSTEOARTHRITIS, MULTIPLE JOINTS 07/04/2007   HYPERCHOLESTEROLEMIA 10/11/2006   DEPRESSION, MAJOR, RECURRENT 10/11/2006   HYPERTENSION, BENIGN SYSTEMIC 10/11/2006   GASTROESOPHAGEAL REFLUX, NO ESOPHAGITIS 10/11/2006   MENOPAUSAL SYNDROME 10/11/2006   Myalgia and myositis 10/11/2006    Past Surgical History:  Procedure Laterality Date   APPENDECTOMY     CYSTOSCOPY WITH STENT PLACEMENT Left 10/27/2015   Procedure: CYSTOSCOPY WITH STENT PLACEMENT;  Surgeon: Nickie Retort, MD;  Location: [facility] OR[facility];  Service: Urology;  Laterality: Left;   JOINT REPLACEMENT     KNEE ARTHROSCOPY     left   LAPAROSCOPIC ENDOMETRIOSIS FULGURATION  early 1970s   TOTAL HIP ARTHROPLASTY Left 08/04/2015   Procedure: TOTAL LEFT  HIP ARTHROPLASTY ANTERIOR APPROACH;  Surgeon: Gaynelle Arabian, MD;  Location: [facility];  Service: Orthopedics;  Laterality: Left;   TOTAL HIP ARTHROPLASTY Right 11/07/2017   Procedure: RIGHT TOTAL HIP ARTHROPLASTY ANTERIOR APPROACH;  Surgeon: Gaynelle Arabian, MD;  Location: [facility];  Service: Orthopedics;  Laterality: Right;   TOTAL KNEE ARTHROPLASTY Left 06/29/2014   Procedure: TOTAL KNEE ARTHROPLASTY LEFT ;  Surgeon: Gearlean Alf, MD;  Location: [facility];  Service: Orthopedics;  Laterality: Left;   TOTAL KNEE ARTHROPLASTY Right 09/18/2016   Procedure: RIGHT TOTAL KNEE ARTHROPLASTY;  Surgeon: Gaynelle Arabian, MD;  Location: [facility];  Service: Orthopedics;  Laterality: Right;  Adductor Block   URETEROSCOPY WITH HOLMIUM LASER LITHOTRIPSY Left 10/27/2015   Procedure: URETEROSCOPY WITH HOLMIUM LASER LITHOTRIPSY;  Surgeon: Nickie Retort, MD;  Location: [facility];  Service: Urology;  Laterality: Left;    Prior to Admission medications   Medication Sig Start Date End Date Taking? Authorizing Provider  albuterol (PROVENTIL HFA;VENTOLIN HFA) 108 (90 Base) MCG/ACT inhaler Inhale 2-4 puffs by mouth every 4 hours as needed for wheezing, cough, and/or  shortness of breath Patient not taking: Reported on 12/16/2018 08/26/18   Hinda Kehr, MD  amLODipine (NORVASC) 5 MG tablet Take 5 mg by mouth daily.     [provider]  benzonatate (TESSALON PERLES) 100 MG capsule Take 1 capsule (100 mg total) by mouth 3 (three) times daily as needed for cough. 08/26/18   Hinda Kehr, MD  celecoxib (CELEBREX) 200 MG capsule Take 200 mg by mouth daily.    [provider]  cholecalciferol (VITAMIN D3) 25 MCG (1000 UT) tablet Take 1,000 Units by mouth daily.    [provider]  fluticasone (FLONASE) 50 MCG/ACT nasal spray Place 1 spray into both nostrils daily as needed for allergies or rhinitis.    [provider]  HYDROcodone-Chlorpheniramine 5-4 MG/5ML SOLN Take 5 mLs by mouth every 6 (six) hours as needed. 08/26/18   Hinda Kehr, MD  irbesartan (AVAPRO) 300 MG tablet Take 300 mg by mouth every morning.    [provider]  methocarbamol (ROBAXIN) 500 MG tablet Take 1 tablet (500 mg total) by mouth every 6 (six) hours as needed. 05/28/19   Johnn Hai, PA-C  Multiple Vitamin (MULTIVITAMIN WITH MINERALS) TABS tablet Take 1 tablet by mouth daily.    [provider]  olopatadine (PATANOL) 0.1 % ophthalmic solution Place 1 drop into both eyes daily as needed for allergies.    [provider]  pravastatin (PRAVACHOL) 40 MG tablet Take 40 mg by mouth every evening.    [provider]    Allergies Atorvastatin  Family History  Problem Relation Age of Onset   Hypertension Mother    Colon cancer Neg Hx    Kidney disease Neg Hx    Bladder Cancer Neg Hx     Social History Social History   Tobacco Use   Smoking status: Never Smoker   Smokeless tobacco: Never Used  Substance Use Topics   Alcohol use: No   Drug use: No    Review of Systems Constitutional: No fever/chills Cardiovascular: Denies chest pain. Respiratory: Denies shortness of breath. Gastrointestinal: No  abdominal pain.  No nausea, no vomiting.  Genitourinary: Negative for dysuria. Musculoskeletal: Positive for left-sided back pain. Skin: Negative for rash. Neurological: Negative for headaches, focal weakness or numbness. ____________________________________________   PHYSICAL EXAM:  VITAL SIGNS: ED Triage Vitals  Enc Vitals Group     BP 05/28/19 1206 139/88     Pulse Rate 05/28/19 1206 (!) 59     Resp 05/28/19 1206 16     Temp 05/28/19 1206 98.7 F (37.1 C)     Temp Source 05/28/19 1206 Oral     SpO2 05/28/19 1206 96 %     Weight 05/28/19 1201 208 lb (94.3 kg)     Height 05/28/19 1201 5\' 8"  (1.727 m)     Head Circumference --      Peak Flow --      Pain Score 05/28/19 1201 8     Pain Loc --  Pain Edu? --      Excl. in [place]? --     Constitutional: Alert and oriented. Well appearing and in no acute distress. Eyes: Conjunctivae are normal.  Head: Atraumatic. Neck: No stridor.   Cardiovascular: Normal rate, regular rhythm. Grossly normal heart sounds.  Good peripheral circulation. Respiratory: Normal respiratory effort.  No retractions. Lungs CTAB. Gastrointestinal: Soft and nontender. No distention.  Musculoskeletal: On examination of the back there is no gross deformity and no point tenderness on palpation of the vertebral bodies thoracic or lumbar spine.  There is however muscle tenderness on palpation of the left lateral and paravertebral muscles upper lumbar area.  Range of motion is restricted secondary to patient's pain and guarding.  Patient was sitting in a wheelchair and was unable to move from it without assistance. Neurologic:  Normal speech and language. No gross focal neurologic deficits are appreciated. No gait instability. Skin:  Skin is warm, dry and intact. No rash noted. Psychiatric: Mood and affect are normal. Speech and behavior are normal.  ____________________________________________   LABS (all labs ordered are listed, but only abnormal results are  displayed)  Labs Reviewed  URINALYSIS, COMPLETE (UACMP) WITH MICROSCOPIC - Abnormal; Notable for the following components:      Result Value   Color, Urine AMBER (*)    APPearance CLOUDY (*)    Ketones, ur 5 (*)    Protein, ur 30 (*)    Bacteria, UA RARE (*)    All other components within normal limits   RADIOLOGY   Official radiology report(s): Dg Thoracic Spine 2 View  Result Date: 05/28/2019 CLINICAL DATA:  Lower back pain.  Left-sided pain. EXAM: THORACIC SPINE 2 VIEWS COMPARISON:  Chest radiograph 08/25/2018 lumbar spine 05/28/2019 FINDINGS: Again noted is S-shaped scoliosis in the thoracic spine. Most prominent curvature is towards the right with the apex at T10. Degenerative endplate changes in the thoracic spine. The thoracic vertebral body heights are maintained. Kyphosis in the cervical spine at C4-C5 region. There may be a small amount of anterolisthesis at C3-C4. Endplate and disc disease in lower cervical spine. IMPRESSION: 1. No acute bone abnormality. 2. Scoliosis of the thoracic spine. 3. Kyphosis and degenerative changes in the lower cervical spine. Electronically Signed   By: Markus Daft M.D.   On: 05/28/2019 14:03   Dg Lumbar Spine 2-3 Views  Result Date: 05/28/2019 CLINICAL DATA:  Left-sided back pain. Fall in August 2020. EXAM: LUMBAR SPINE - 2-3 VIEW COMPARISON:  Lumbar MRI, 06/09/2007. FINDINGS: No fracture or bone lesion. Mild-to-moderate levoscoliosis, apex at L3. Minimal anterolisthesis of L4 on L5. No other spondylolisthesis. Moderate loss of disc height at L2-L3, L4-L5 and L5-S1. There is associated endplate spurring. Soft tissues are unremarkable. IMPRESSION: 1. No fracture, bone lesion or acute finding. 2. Mild-to-moderate levoscoliosis with associated disc degenerative changes. Minimal anterolisthesis of L4 on L5. Electronically Signed   By: Lajean Manes M.D.   On: 05/28/2019 14:06     ____________________________________________   PROCEDURES  Procedure(s) performed (including Critical Care):  Procedures   ____________________________________________   INITIAL IMPRESSION / ASSESSMENT AND PLAN / ED COURSE  As part of my medical decision making, I reviewed the following data within the electronic MEDICAL RECORD NUMBER Notes from prior ED visits and [place] Controlled Substance Database  68 year old female presents to the ED with complaint of left-sided back pain.  Patient states this is been going on for 3 to 4 days.  She has been taking arthritis strength Tylenol without any relief.  Patient also gave a history of falling in August in which she was never seen.  Urinalysis was reassuring and patient was made aware.  X-rays were ordered for thoracic and lumbar spine.  There was scoliosis noted in the thoracic spine and moderate degenerative changes.  There was also mild to moderate levoscoliosis associated with degenerative changes noted in the lumbar spine.  Patient will follow-up with her primary care provider.  She will increase the Celebrex to twice a day with food until her back is improving and then reduce back down to her usual 1/day.  She was also given a prescription for Robaxin 500 mg every 6 hours as needed.  Patient is aware that this medication could cause drowsiness and increase her risk for falling.  She is to return to the emergency department if any severe worsening of her symptoms.  ____________________________________________   FINAL CLINICAL IMPRESSION(S) / ED DIAGNOSES  Final diagnoses:  Acute left-sided thoracic back pain     ED Discharge Orders         Ordered    methocarbamol (ROBAXIN) 500 MG tablet  Every 6 hours PRN     05/28/19 1429           Note:  This document was prepared using Dragon voice recognition software and may include unintentional dictation errors.    Johnn Hai, PA-C 05/28/19 1438    Blake Divine,  MD 05/28/19 1445

## 2019-06-17 DIAGNOSIS — Z1231 Encounter for screening mammogram for malignant neoplasm of breast: Secondary | ICD-10-CM | POA: Diagnosis not present

## 2019-07-21 DIAGNOSIS — E559 Vitamin D deficiency, unspecified: Secondary | ICD-10-CM | POA: Diagnosis not present

## 2019-07-21 DIAGNOSIS — E210 Primary hyperparathyroidism: Secondary | ICD-10-CM | POA: Diagnosis not present

## 2019-07-21 DIAGNOSIS — R7309 Other abnormal glucose: Secondary | ICD-10-CM | POA: Diagnosis not present

## 2019-07-28 ENCOUNTER — Other Ambulatory Visit: Payer: Self-pay | Admitting: Endocrinology

## 2019-07-28 DIAGNOSIS — E213 Hyperparathyroidism, unspecified: Secondary | ICD-10-CM

## 2019-07-28 DIAGNOSIS — I129 Hypertensive chronic kidney disease with stage 1 through stage 4 chronic kidney disease, or unspecified chronic kidney disease: Secondary | ICD-10-CM | POA: Diagnosis not present

## 2019-07-28 DIAGNOSIS — E559 Vitamin D deficiency, unspecified: Secondary | ICD-10-CM | POA: Diagnosis not present

## 2019-07-28 DIAGNOSIS — E210 Primary hyperparathyroidism: Secondary | ICD-10-CM | POA: Diagnosis not present

## 2019-07-29 ENCOUNTER — Ambulatory Visit
Admission: RE | Admit: 2019-07-29 | Discharge: 2019-07-29 | Disposition: A | Discharge status: Home / Self Care | Payer: Federal, State, Local not specified - PPO | Source: Ambulatory Visit | Attending: Endocrinology | Admitting: Endocrinology

## 2019-07-29 ENCOUNTER — Other Ambulatory Visit: Payer: Self-pay

## 2019-07-29 DIAGNOSIS — Z13820 Encounter for screening for osteoporosis: Secondary | ICD-10-CM | POA: Diagnosis not present

## 2019-07-29 DIAGNOSIS — E213 Hyperparathyroidism, unspecified: Secondary | ICD-10-CM

## 2019-07-29 DIAGNOSIS — Z780 Asymptomatic menopausal state: Secondary | ICD-10-CM | POA: Diagnosis not present

## 2019-09-03 DIAGNOSIS — H25812 Combined forms of age-related cataract, left eye: Secondary | ICD-10-CM | POA: Diagnosis not present

## 2019-09-03 DIAGNOSIS — H40033 Anatomical narrow angle, bilateral: Secondary | ICD-10-CM | POA: Diagnosis not present

## 2019-09-08 DIAGNOSIS — E210 Primary hyperparathyroidism: Secondary | ICD-10-CM | POA: Diagnosis not present

## 2019-09-08 DIAGNOSIS — E559 Vitamin D deficiency, unspecified: Secondary | ICD-10-CM | POA: Diagnosis not present

## 2019-10-06 ENCOUNTER — Ambulatory Visit: Payer: Federal, State, Local not specified - PPO | Attending: Internal Medicine

## 2019-10-06 DIAGNOSIS — Z23 Encounter for immunization: Secondary | ICD-10-CM

## 2019-10-06 NOTE — Progress Notes (Signed)
   Covid-19 Vaccination Clinic  Name:  Alexandra Gibson    MRN: [id number] DOB: [birth year]  10/06/2019  Alexandra Gibson was observed post Covid-19 immunization for 15 minutes without incidence. She was provided with Vaccine Information Sheet and instruction to access the V-Safe system.   Alexandra Gibson was instructed to call 911 with any severe reactions post vaccine: Marland Kitchen Difficulty breathing  . Swelling of your face and throat  . A fast heartbeat  . A bad rash all over your body  . Dizziness and weakness    Immunizations Administered    Name Date Dose VIS Date Route   Pfizer COVID-19 Vaccine 10/06/2019  9:28 AM 0.3 mL 07/25/2019 Intramuscular   Manufacturer: [place]   Lot: [id number]   [place]: [id number]

## 2019-10-14 ENCOUNTER — Other Ambulatory Visit: Payer: Federal, State, Local not specified - PPO

## 2019-10-15 DIAGNOSIS — H25812 Combined forms of age-related cataract, left eye: Secondary | ICD-10-CM | POA: Diagnosis not present

## 2019-10-27 DIAGNOSIS — R7309 Other abnormal glucose: Secondary | ICD-10-CM | POA: Diagnosis not present

## 2019-10-27 DIAGNOSIS — E785 Hyperlipidemia, unspecified: Secondary | ICD-10-CM | POA: Diagnosis not present

## 2019-10-28 ENCOUNTER — Ambulatory Visit: Payer: Federal, State, Local not specified - PPO | Attending: Internal Medicine

## 2019-10-28 DIAGNOSIS — Z23 Encounter for immunization: Secondary | ICD-10-CM

## 2019-10-28 NOTE — Progress Notes (Signed)
   Covid-19 Vaccination Clinic  Name:  TINEKA PERRELL    MRN: [id number] DOB: 07-01-1951  10/28/2019  Ms. Golub was observed post Covid-19 immunization for 15 minutes without incident. She was provided with Vaccine Information Sheet and instruction to access the V-Safe system.   Ms. Petrovic was instructed to call 911 with any severe reactions post vaccine: Marland Kitchen Difficulty breathing  . Swelling of face and throat  . A fast heartbeat  . A bad rash all over body  . Dizziness and weakness   Immunizations Administered    Name Date Dose VIS Date Route   Pfizer COVID-19 Vaccine 10/28/2019 10:06 AM 0.3 mL 07/25/2019 Intramuscular   Manufacturer: [place]   Lot: [id number]   [place]: [id number]

## 2019-11-04 DIAGNOSIS — R7309 Other abnormal glucose: Secondary | ICD-10-CM | POA: Diagnosis not present

## 2019-11-04 DIAGNOSIS — K219 Gastro-esophageal reflux disease without esophagitis: Secondary | ICD-10-CM | POA: Diagnosis not present

## 2019-11-04 DIAGNOSIS — E785 Hyperlipidemia, unspecified: Secondary | ICD-10-CM | POA: Diagnosis not present

## 2019-11-04 DIAGNOSIS — I129 Hypertensive chronic kidney disease with stage 1 through stage 4 chronic kidney disease, or unspecified chronic kidney disease: Secondary | ICD-10-CM | POA: Diagnosis not present

## 2020-01-19 DIAGNOSIS — H2512 Age-related nuclear cataract, left eye: Secondary | ICD-10-CM | POA: Diagnosis not present

## 2020-01-19 DIAGNOSIS — Z01818 Encounter for other preprocedural examination: Secondary | ICD-10-CM | POA: Diagnosis not present

## 2020-01-19 DIAGNOSIS — H25812 Combined forms of age-related cataract, left eye: Secondary | ICD-10-CM | POA: Diagnosis not present

## 2020-02-06 DIAGNOSIS — H2511 Age-related nuclear cataract, right eye: Secondary | ICD-10-CM | POA: Diagnosis not present

## 2020-02-06 DIAGNOSIS — H25812 Combined forms of age-related cataract, left eye: Secondary | ICD-10-CM | POA: Diagnosis not present

## 2020-02-19 ENCOUNTER — Encounter: Payer: Self-pay | Admitting: Internal Medicine

## 2020-02-19 ENCOUNTER — Other Ambulatory Visit: Payer: Self-pay

## 2020-02-19 ENCOUNTER — Ambulatory Visit (INDEPENDENT_AMBULATORY_CARE_PROVIDER_SITE_OTHER): Payer: Federal, State, Local not specified - PPO | Admitting: Internal Medicine

## 2020-02-19 VITALS — BP 132/80 | HR 58 | Temp 98.7°F | Ht 65.0 in | Wt 209.0 lb

## 2020-02-19 DIAGNOSIS — G2581 Restless legs syndrome: Secondary | ICD-10-CM

## 2020-02-19 DIAGNOSIS — G4733 Obstructive sleep apnea (adult) (pediatric): Secondary | ICD-10-CM

## 2020-02-19 NOTE — Patient Instructions (Signed)
Order- schedule mask fitting at sleep disorders center    Dx OSA  We can continue auto 5-15, mask of choice, humidifier, supplies, AirView/ card  Please call if we can help

## 2020-02-19 NOTE — Progress Notes (Signed)
HPI F never smoker followed for OSA, complicated by  HBP, GERD, Osteoarthritis, Restless Legs, Hypercholesterolemia, Hypercalcemia, Kidney stones, Depression, Hydronephrosis L, Allergic Rhinitis HST 01/05/19 AHI 37.4/ hr, desaturation to 67%, body weight 204 lbs  -----------------------------------------------------------------------------------------  12/15/2018- 68 yoF never smoker for sleep evaluation Medical problem list includes HBP, GERD, Osteoarthritis, Restless Legs, Hypercholesterolemia, Hypercalcemia, Depression, Hydronephrosis L, Allergic Rhinitis -----referred by Dr. Shelia Media (PCP) for snoring, states her husband reports pt snores very loudly; was supposed to do a sleep study many years ago, but did not get one; states snoring interferes w/ sleeping along w/ chest "heaviness". Between 10 and 11 PM, sleep latency up to 1 hour, wakes 4 or 5 times per night before up at 8 AM. Separate bedrooms due to her loud snoring.  Restless sleeper, waking frequently. Minimal daytime sleepiness admitted.  Little caffeine.  No ENT surgery.  Denies heart or lung problems.  02/19/20- 89 yoF never smoker followed for OSA, complicated by  HBP, GERD, Osteoarthritis, Restless Legs, Hypercholesterolemia, Hypercalcemia, Kidney stones, Depression, Hydronephrosis L, Allergic Rhinitis HST 01/05/19 AHI 37.4/ hr, desaturation to 67%, body weight 204 lbs CPAP auto 5-15/ [employer] Download compliance 97%, AHI 4.8/ hr Body weight today 209 lbs ------wakes up at times to readjust Cpap mask due to hearing air blowing out Discussed CPAP mask, comfort. Can send for mask fitting.  Covid vax- 2 Phizer   ROS-see HPI   + = positive Constitutional:    weight loss, night sweats, fevers, chills, fatigue, lassitude. HEENT:    headaches, difficulty swallowing, tooth/dental problems, sore throat,       sneezing, itching, ear ache, nasal congestion, post nasal drip, snoring CV:    chest pain, orthopnea, PND, swelling in lower  extremities, anasarca,                                  dizziness, palpitations Resp:   shortness of breath with exertion or at rest.                productive cough,   non-productive cough, coughing up of blood.              change in color of mucus.  wheezing.   Skin:    rash or lesions. GI:  + heartburn, indigestion, abdominal pain, nausea, vomiting, diarrhea,                 change in bowel habits, loss of appetite GU: dysuria, change in color of urine, no urgency or frequency.   flank pain. MS:  + joint pain, stiffness, decreased range of motion, back pain. Neuro-     nothing unusual Psych:  change in mood or affect.  depression or anxiety.   memory loss.  OBJ- Physical Exam General- Alert, Oriented, Affect-appropriate, Distress- none acute, + obese Skin- rash-none, lesions- none, excoriation- none Lymphadenopathy- none Head- atraumatic            Eyes- Gross vision intact, PERRLA, conjunctivae and secretions clear            Ears- Hearing, canals-normal            Nose- Clear, no-Septal dev, mucus, polyps, erosion, perforation             Throat- Mallampati II-III , mucosa clear , drainage- none, tonsils- atrophic Neck- flexible , trachea midline, no stridor , thyroid nl, carotid no bruit Chest - symmetrical excursion , unlabored  Heart/CV- RRR , no murmur , no gallop  , no rub, nl s1 s2                           - JVD- none , edema- none, stasis changes- none, varices- none           Lung- clear to P&A, wheeze- none, cough- none , dullness-none, rub- none           Chest wall-  Abd-  Br/ Gen/ Rectal- Not done, not indicated Extrem- cyanosis- none, clubbing, none, atrophy- none, strength- nl, + knee surgery scar Neuro- grossly intact to observation

## 2020-03-02 ENCOUNTER — Other Ambulatory Visit (HOSPITAL_BASED_OUTPATIENT_CLINIC_OR_DEPARTMENT_OTHER): Payer: Medicare Other | Admitting: Internal Medicine

## 2020-03-17 ENCOUNTER — Ambulatory Visit: Payer: Federal, State, Local not specified - PPO | Admitting: Internal Medicine

## 2020-05-04 DIAGNOSIS — I129 Hypertensive chronic kidney disease with stage 1 through stage 4 chronic kidney disease, or unspecified chronic kidney disease: Secondary | ICD-10-CM | POA: Diagnosis not present

## 2020-05-04 DIAGNOSIS — E559 Vitamin D deficiency, unspecified: Secondary | ICD-10-CM | POA: Diagnosis not present

## 2020-05-04 DIAGNOSIS — E7800 Pure hypercholesterolemia, unspecified: Secondary | ICD-10-CM | POA: Diagnosis not present

## 2020-05-04 DIAGNOSIS — R748 Abnormal levels of other serum enzymes: Secondary | ICD-10-CM | POA: Diagnosis not present

## 2020-05-04 DIAGNOSIS — Z0000 Encounter for general adult medical examination without abnormal findings: Secondary | ICD-10-CM | POA: Diagnosis not present

## 2020-05-04 DIAGNOSIS — E0590 Thyrotoxicosis, unspecified without thyrotoxic crisis or storm: Secondary | ICD-10-CM | POA: Diagnosis not present

## 2020-05-04 DIAGNOSIS — E785 Hyperlipidemia, unspecified: Secondary | ICD-10-CM | POA: Diagnosis not present

## 2020-05-09 NOTE — Assessment & Plan Note (Signed)
Benefits from CPAP with good compliance and control. But frustrated by mask leaks Plan- continue auto 5-15, refer for mask fitting

## 2020-05-09 NOTE — Assessment & Plan Note (Signed)
Doesn't seem to be disturbing her currently. Watch for this.

## 2020-05-11 DIAGNOSIS — R7309 Other abnormal glucose: Secondary | ICD-10-CM | POA: Diagnosis not present

## 2020-05-11 DIAGNOSIS — I10 Essential (primary) hypertension: Secondary | ICD-10-CM | POA: Diagnosis not present

## 2020-05-11 DIAGNOSIS — E785 Hyperlipidemia, unspecified: Secondary | ICD-10-CM | POA: Diagnosis not present

## 2020-05-11 DIAGNOSIS — R748 Abnormal levels of other serum enzymes: Secondary | ICD-10-CM | POA: Diagnosis not present

## 2020-05-11 DIAGNOSIS — G4733 Obstructive sleep apnea (adult) (pediatric): Secondary | ICD-10-CM | POA: Diagnosis not present

## 2020-05-11 DIAGNOSIS — K219 Gastro-esophageal reflux disease without esophagitis: Secondary | ICD-10-CM | POA: Diagnosis not present

## 2020-05-11 DIAGNOSIS — F419 Anxiety disorder, unspecified: Secondary | ICD-10-CM | POA: Diagnosis not present

## 2020-05-11 DIAGNOSIS — Z0000 Encounter for general adult medical examination without abnormal findings: Secondary | ICD-10-CM | POA: Diagnosis not present

## 2020-05-11 DIAGNOSIS — M170 Bilateral primary osteoarthritis of knee: Secondary | ICD-10-CM | POA: Diagnosis not present

## 2020-05-26 DIAGNOSIS — G4733 Obstructive sleep apnea (adult) (pediatric): Secondary | ICD-10-CM | POA: Diagnosis not present

## 2020-06-10 DIAGNOSIS — Z23 Encounter for immunization: Secondary | ICD-10-CM | POA: Diagnosis not present

## 2020-06-10 DIAGNOSIS — R748 Abnormal levels of other serum enzymes: Secondary | ICD-10-CM | POA: Diagnosis not present

## 2020-06-18 ENCOUNTER — Encounter: Payer: Self-pay | Admitting: Gastroenterology

## 2020-07-02 ENCOUNTER — Other Ambulatory Visit: Payer: Self-pay

## 2020-07-02 ENCOUNTER — Encounter: Payer: Self-pay | Admitting: Gastroenterology

## 2020-07-02 ENCOUNTER — Ambulatory Visit (INDEPENDENT_AMBULATORY_CARE_PROVIDER_SITE_OTHER): Payer: Federal, State, Local not specified - PPO | Admitting: Gastroenterology

## 2020-07-02 ENCOUNTER — Other Ambulatory Visit (INDEPENDENT_AMBULATORY_CARE_PROVIDER_SITE_OTHER): Payer: Federal, State, Local not specified - PPO

## 2020-07-02 VITALS — BP 160/86 | HR 64 | Ht 67.0 in | Wt 215.2 lb

## 2020-07-02 DIAGNOSIS — K219 Gastro-esophageal reflux disease without esophagitis: Secondary | ICD-10-CM

## 2020-07-02 DIAGNOSIS — R748 Abnormal levels of other serum enzymes: Secondary | ICD-10-CM | POA: Diagnosis not present

## 2020-07-02 DIAGNOSIS — R7989 Other specified abnormal findings of blood chemistry: Secondary | ICD-10-CM

## 2020-07-02 DIAGNOSIS — R1310 Dysphagia, unspecified: Secondary | ICD-10-CM | POA: Diagnosis not present

## 2020-07-02 DIAGNOSIS — R1319 Other dysphagia: Secondary | ICD-10-CM

## 2020-07-02 LAB — HEPATIC FUNCTION PANEL
ALT: 17 U/L (ref 0–35)
AST: 13 U/L (ref 0–37)
Albumin: 4.8 g/dL (ref 3.5–5.2)
Alkaline Phosphatase: 124 U/L — ABNORMAL HIGH (ref 39–117)
Bilirubin, Direct: 0.2 mg/dL (ref 0.0–0.3)
Total Bilirubin: 0.8 mg/dL (ref 0.2–1.2)
Total Protein: 7.8 g/dL (ref 6.0–8.3)

## 2020-07-02 LAB — GAMMA GT: GGT: 104 U/L — ABNORMAL HIGH (ref 7–51)

## 2020-07-02 NOTE — Patient Instructions (Signed)
You have been scheduled for an abdominal ultrasound at [facility] Radiology (1st floor of hospital) on 07/14/20 at 9:00am. Please arrive 15 minutes prior to your appointment for registration. Make certain not to have anything to eat or drink 6 hours prior to your appointment. Should you need to reschedule your appointment, please contact radiology at [phone]. This test typically takes about 30 minutes to perform.  Your provider has requested that you go to the basement level for lab work before leaving today. Press "B" on the elevator. The lab is located at the first door on the left as you exit the elevator.   Due to recent changes in healthcare laws, you may see the results of your imaging and laboratory studies on MyChart before your provider has had a chance to review them.  We understand that in some cases there may be results that are confusing or concerning to you. Not all laboratory results come back in the same time frame and the provider may be waiting for multiple results in order to interpret others.  Please give [place] 48 hours in order for your provider to thoroughly review all the results before contacting the office for clarification of your results.   You will be due for a recall colonoscopy in 2023. We will send you a reminder in the mail when it gets closer to that time.  You will be due for a recall EGD in 2022. We will send you a reminder in the mail when it gets closer to that time.  Thank you for choosing me and [facility].  Dr. Rush Landmark

## 2020-07-06 ENCOUNTER — Encounter: Payer: Self-pay | Admitting: Gastroenterology

## 2020-07-06 DIAGNOSIS — R7989 Other specified abnormal findings of blood chemistry: Secondary | ICD-10-CM | POA: Insufficient documentation

## 2020-07-06 DIAGNOSIS — R1310 Dysphagia, unspecified: Secondary | ICD-10-CM | POA: Insufficient documentation

## 2020-07-06 DIAGNOSIS — R748 Abnormal levels of other serum enzymes: Secondary | ICD-10-CM | POA: Insufficient documentation

## 2020-07-06 NOTE — Progress Notes (Signed)
[facility] VISIT   Primary Care Provider Deland Pretty, [place] [address] [phone]  Referring Provider Deland Pretty, MD [address] [place],  [place] [address] [phone]  Patient Profile: Alexandra Gibson is a 69 y.o. female with a pmh significant for anxiety/MDD, fibromyalgia, nephrolithiasis, hypertension, hyperlipidemia, prediabetes, osteoarthritis, GERD.  The patient presents to the [facility] for an evaluation and management of problem(s) noted below:  Problem List 1. Elevated LFTs   2. Elevated serum GGT level   3. Elevated alkaline phosphatase level   4. Gastroesophageal reflux disease, unspecified whether esophagitis present   5. Esophageal dysphagia     History of Present Illness This is the patient's first visit to the outpatient [place] clinic.  She is a former patient of Dr. Olevia Perches years ago.  Patient is referred by her primary care provider due to abnormal LFT elevation in the setting of elevated alkaline phosphatase and elevated GGT.  Patient describes no significant GI symptoms at this time.  These were done in the setting of normal laboratories for her annual exam.  She describes never being told she has had abnormal liver tests in the past.  She has had previous hepatitis testing per her remembrance but cannot tell when that was done and we do not have access to those records.  The patient has had cross-sectional imaging of her abdomen years ago in the setting of an issue/episode of nephrolithiasis and the liver looked normal at that time.  The patient describes no significant alcohol consumption at any time.  She does take celecoxib on a daily basis.  The patient denies any issues with jaundice, scleral icterus, generalized pruritus, darkened/amber urine, clay-colored stools, LE edema, hematemesis, coffee-ground emesis, abdominal distention, confusion.  She does  have a longer standing issues of pyrosis but is well controlled on her current PPI dosing.  In the past she has had issues with solid food dysphagia but if she is chews very thoroughly she does not have that issue and has not noted it for months.  She has never had an upper endoscopy.  GI Review of Systems Positive as above Negative for odynophagia, nausea, vomiting, pain, bloating, change in bowel habits, melena, hematochezia  Review of Systems General: Denies fevers/chills/weight loss unintentionally HEENT: Denies oral lesions Cardiovascular: Denies chest pain/palpitations Pulmonary: Denies shortness of breath/nocturnal cough Gastroenterological: See HPI Genitourinary: Denies darkened urine Hematological: Denies easy bruising/bleeding Dermatological: Denies jaundice Psychological: Mood is stable though she was somewhat anxious about why she was referred to the GI clinic   Medications Current Outpatient Medications  Medication Sig Dispense Refill   amLODipine (NORVASC) 5 MG tablet Take 5 mg by mouth daily.      B Complex-C-Folic Acid (STRESS B COMPLEX PO) Take 1 capsule by mouth daily.     celecoxib (CELEBREX) 200 MG capsule Take 200 mg by mouth daily.     cholecalciferol (VITAMIN D3) 25 MCG (1000 UT) tablet Take 1,000 Units by mouth daily.     Coenzyme Q10 (CO Q10) 200 MG CAPS Take 1 capsule by mouth daily.     fluticasone (FLONASE) 50 MCG/ACT nasal spray Place 1 spray into both nostrils daily as needed for allergies or rhinitis.     irbesartan (AVAPRO) 300 MG tablet Take 300 mg by mouth every morning.     Menaquinone-7 (VITAMIN K2 PO) Take 90 mcg by mouth daily.     Multiple Vitamin (MULTIVITAMIN WITH MINERALS) TABS tablet Take  1 tablet by mouth daily.     Omega 3 1000 MG CAPS Take 1 capsule by mouth daily.     pantoprazole (PROTONIX) 40 MG tablet Take 40 mg by mouth daily.     Polyethyl Glycol-Propyl Glycol (SYSTANE OP) Place 1 drop into both eyes daily.      pravastatin (PRAVACHOL) 40 MG tablet Take 40 mg by mouth every evening.     zinc gluconate 50 MG tablet Take 50 mg by mouth daily.     No current facility-administered medications for this visit.    Allergies Allergies  Allergen Reactions   Lipitor [Atorvastatin] Other (See Comments)    REACTION: leg cramp    Histories Past Medical History:  Diagnosis Date   Anxiety    Anxiety and depression    Arthritis    Depression    Fibromyalgia    GERD (gastroesophageal reflux disease)    History of kidney stones    History of urinary tract infection    Hyperlipidemia    Hypertension    Kidney stone    10-2016; " i had a kidney stone that wouldnt move so they had to go in to take it out "   Numbness    right hand/comes and goes    Osteoarthritis of knee    bil-gets injections   Parathyroid tumor    Pre-diabetes    Urinary frequency    Wears glasses    Past Surgical History:  Procedure Laterality Date   APPENDECTOMY     CYSTOSCOPY WITH STENT PLACEMENT Left 10/27/2015   Procedure: CYSTOSCOPY WITH STENT PLACEMENT;  Surgeon: Nickie Retort, MD;  Location: [facility];  Service: Urology;  Laterality: Left;   JOINT REPLACEMENT     KNEE ARTHROSCOPY     left   LAPAROSCOPIC ENDOMETRIOSIS FULGURATION  early 1970s   TOTAL HIP ARTHROPLASTY Left 08/04/2015   Procedure: TOTAL LEFT  HIP ARTHROPLASTY ANTERIOR APPROACH;  Surgeon: Gaynelle Arabian, MD;  Location: [facility];  Service: Orthopedics;  Laterality: Left;   TOTAL HIP ARTHROPLASTY Right 11/07/2017   Procedure: RIGHT TOTAL HIP ARTHROPLASTY ANTERIOR APPROACH;  Surgeon: Gaynelle Arabian, MD;  Location: [facility];  Service: Orthopedics;  Laterality: Right;   TOTAL KNEE ARTHROPLASTY Left 06/29/2014   Procedure: TOTAL KNEE ARTHROPLASTY LEFT ;  Surgeon: Gearlean Alf, MD;  Location: [facility];  Service: Orthopedics;  Laterality: Left;   TOTAL KNEE ARTHROPLASTY Right 09/18/2016   Procedure: RIGHT TOTAL KNEE ARTHROPLASTY;   Surgeon: Gaynelle Arabian, MD;  Location: [facility];  Service: Orthopedics;  Laterality: Right;  Adductor Block   URETEROSCOPY WITH HOLMIUM LASER LITHOTRIPSY Left 10/27/2015   Procedure: URETEROSCOPY WITH HOLMIUM LASER LITHOTRIPSY;  Surgeon: Nickie Retort, MD;  Location: [facility];  Service: Urology;  Laterality: Left;   Social History   Socioeconomic History   Marital status: Married    Spouse name: Not on file   Number of children: Not on file   Years of education: Not on file   Highest education level: Not on file  Occupational History   Not on file  Tobacco Use   Smoking status: Never Smoker   Smokeless tobacco: Never Used  Vaping Use   Vaping Use: Never used  Substance and Sexual Activity   Alcohol use: No   Drug use: No   Sexual activity: Not on file  Other Topics Concern   Not on file  Social History Narrative   Not on file   Social Determinants of Health   Financial Resource  Strain:    Difficulty of Paying Living Expenses: Not on file  Food Insecurity:    Worried About [place] in the Last Year: Not on file   Ran Out of Food in the Last Year: Not on file  Transportation Needs:    Lack of Transportation (Medical): Not on file   Lack of Transportation (Non-Medical): Not on file  Physical Activity:    Days of Exercise per Week: Not on file   Minutes of Exercise per Session: Not on file  Stress:    Feeling of Stress : Not on file  Social Connections:    Frequency of Communication with Friends and Family: Not on file   Frequency of Social Gatherings with Friends and Family: Not on file   Attends Religious Services: Not on file   Active Member of Clubs or Organizations: Not on file   Attends Archivist Meetings: Not on file   Marital Status: Not on file  Intimate Partner Violence:    Fear of Current or Ex-Partner: Not on file   Emotionally Abused: Not on file   Physically Abused: Not on file   Sexually Abused:  Not on file   Family History  Problem Relation Age of Onset   Hypertension Mother    Colon cancer Neg Hx    Kidney disease Neg Hx    Bladder Cancer Neg Hx    Esophageal cancer Neg Hx    Pancreatic cancer Neg Hx    Rectal cancer Neg Hx    Stomach cancer Neg Hx    I have reviewed her medical, social, and family history in detail and updated the electronic medical record as necessary.    PHYSICAL EXAMINATION  BP (!) 160/86 (BP Location: Left Arm, Patient Position: Sitting, Cuff Size: Normal)    Pulse 64    Ht '5\' 7"'  (1.702 m) Comment: height measured without shoes   Wt 215 lb 4 oz (97.6 kg)    BMI 33.71 kg/m  Wt Readings from Last 3 Encounters:  07/02/20 215 lb 4 oz (97.6 kg)  02/19/20 209 lb (94.8 kg)  05/28/19 208 lb (94.3 kg)  GEN: NAD, appears stated age, doesn't appear chronically ill PSYCH: Cooperative, without pressured speech EYE: Conjunctivae pink, sclerae anicteric ENT: MMM, without oral ulcers, no erythema or exudates noted NECK: Supple CV: RR without R/Gs  RESP: CTAB posteriorly, without wheezing GI: NABS, soft, NT/ND, without rebound or guarding, no HSM appreciated MSK/EXT: No lower extremity edema SKIN: No jaundice NEURO:  Alert & Oriented x 3, no focal deficits   REVIEW OF DATA  I reviewed the following data at the time of this encounter:  GI Procedures and Studies  2013 colonoscopy 3 polyps removed. Mild diverticulosis in the sigmoid colon. Otherwise normal exam.  Laboratory Studies  Reviewed those in epic  Outside laboratories March 2021 labs AST/ALT 29/39 Alk phos 112 (upper limit normal 150) Total bili 0.19 April 2020 labs AST/ALT 14/13 Alk phos 138 (upper limit normal 121) Total bili 0.5 GGT 93 (upper limit of normal 60)  October 2021 labs AST/ALT 12/28 Alk phos 128 (upper limit normal 150) Total bili 0.5 GGT 115 (upper limit of normal 60)  Imaging Studies  February 2017 CT abdomen pelvis with contrast IMPRESSION: Left  hydronephrosis with left perinephric stranding due to obstruction by 6 mm stone in the proximal left ureter.   ASSESSMENT  Ms. Slavick is a 69 y.o. female with a pmh significant for anxiety/MDD, fibromyalgia, nephrolithiasis, hypertension, hyperlipidemia,  prediabetes, osteoarthritis, GERD.  The patient is seen today for evaluation and management of:  1. Elevated LFTs   2. Elevated serum GGT level   3. Elevated alkaline phosphatase level   4. Gastroesophageal reflux disease, unspecified whether esophagitis present   5. Esophageal dysphagia    The patient is hemodynamically and clinically stable.  The patient was found to have abnormal alkaline phosphatase and GGT on normal laboratory evaluation.  She subsequently had follow-up GGT elevation 1 month later that showed a persistence in elevation.  Her alkaline phosphatase had normalized.  With that being said the laboratories were also of different calibers in regards to their normal limits.  Patient is not describing significant alcohol in her history that may lead to GGT elevations.  Certainly nonsteroidals in a long period in time can sometimes lead to alkaline phosphatase and GGT elevations although this is diagnosis of exclusion.  We will begin her work-up by first repeating her labs and planning an abdominal ultrasound to evaluate the biliary tree.  If her liver tests remain abnormal then a more significant/in-depth liver serological work-up will be considered.  MRI/MRCP imaging may be considered as well if alkaline phosphatase remains elevated and we find that there is no evidence of alkaline phosphatase isoenzymes coming from bone in the setting of fractionation in the future.  She certainly can be at risk of primary biliary cholangitis if her alkaline phosphatase and GGT remain elevated so we will consider the further work-up of that in the future.  We briefly discussed the potential need for liver biopsy in the future but hopefully will not need  that.  Further work-up to be dictated based on the findings of the repeat labs as well as the ultrasound.  She will be due for colon cancer screening in 2023.  She has had longstanding GERD and has never had an upper endoscopy at some point she would benefit from an evaluation to ensure she does not have evidence of Barrett's esophagus.  We will consider this in the future and consider dilation if necessary although her symptoms seem to be doing very well on current PPI dosing and so she is aware of the potential need for an endoscopy but we are deferring on that while we try and figure out her liver test abnormalities first.  All patient questions were answered to the best of my ability, and the patient agrees to the aforementioned plan of action with follow-up as indicated.   PLAN  Laboratories as outlined below Right upper quadrant ultrasound to evaluate biliary tree and liver echotexture Depending on laboratories if alkaline phosphatase and GGT remain elevated then further serological work-up will be required and considered to rule out PBC as well as other potential etiologies No alcohol consumption at this time (she does not do this already) Consider a diagnostic endoscopy for Barrett's screening based on her longstanding GERD symptoms -Unless patient develops new red flag symptoms. Continue current PPI dosing Colonoscopy for colon cancer screening in 2023   Orders Placed This Encounter  Procedures   US Abdomen Limited RUQ (LIVER/GB)   Hepatic function panel   Gamma GT    New Prescriptions   No medications on file   Modified Medications   No medications on file    Planned Follow Up No follow-ups on file.   Total Time in Face-to-Face and in Coordination of Care for patient including independent/personal interpretation/review of prior testing, medical history, examination, medication adjustment, communicating results with the patient directly, and documentation with  the EHR is  45 minutes.   Justice Britain, MD [facility] Office # [phone]

## 2020-07-14 ENCOUNTER — Other Ambulatory Visit: Payer: Self-pay

## 2020-07-14 ENCOUNTER — Ambulatory Visit (HOSPITAL_COMMUNITY)
Admission: RE | Admit: 2020-07-14 | Discharge: 2020-07-14 | Disposition: A | Discharge status: Home / Self Care | Payer: Federal, State, Local not specified - PPO | Source: Ambulatory Visit | Attending: Gastroenterology | Admitting: Gastroenterology

## 2020-07-14 DIAGNOSIS — R1319 Other dysphagia: Secondary | ICD-10-CM

## 2020-07-14 DIAGNOSIS — R7989 Other specified abnormal findings of blood chemistry: Secondary | ICD-10-CM | POA: Diagnosis not present

## 2020-07-14 DIAGNOSIS — K219 Gastro-esophageal reflux disease without esophagitis: Secondary | ICD-10-CM | POA: Diagnosis not present

## 2020-07-14 DIAGNOSIS — K760 Fatty (change of) liver, not elsewhere classified: Secondary | ICD-10-CM | POA: Diagnosis not present

## 2020-07-15 ENCOUNTER — Other Ambulatory Visit: Payer: Self-pay

## 2020-07-15 DIAGNOSIS — R935 Abnormal findings on diagnostic imaging of other abdominal regions, including retroperitoneum: Secondary | ICD-10-CM

## 2020-07-15 DIAGNOSIS — R932 Abnormal findings on diagnostic imaging of liver and biliary tract: Secondary | ICD-10-CM

## 2020-07-20 DIAGNOSIS — E559 Vitamin D deficiency, unspecified: Secondary | ICD-10-CM | POA: Diagnosis not present

## 2020-07-20 DIAGNOSIS — E210 Primary hyperparathyroidism: Secondary | ICD-10-CM | POA: Diagnosis not present

## 2020-07-27 DIAGNOSIS — E559 Vitamin D deficiency, unspecified: Secondary | ICD-10-CM | POA: Diagnosis not present

## 2020-07-27 DIAGNOSIS — I129 Hypertensive chronic kidney disease with stage 1 through stage 4 chronic kidney disease, or unspecified chronic kidney disease: Secondary | ICD-10-CM | POA: Diagnosis not present

## 2020-07-27 DIAGNOSIS — E210 Primary hyperparathyroidism: Secondary | ICD-10-CM | POA: Diagnosis not present

## 2020-07-27 DIAGNOSIS — E7800 Pure hypercholesterolemia, unspecified: Secondary | ICD-10-CM | POA: Diagnosis not present

## 2020-07-27 DIAGNOSIS — E785 Hyperlipidemia, unspecified: Secondary | ICD-10-CM | POA: Diagnosis not present

## 2020-07-27 DIAGNOSIS — R7309 Other abnormal glucose: Secondary | ICD-10-CM | POA: Diagnosis not present

## 2020-07-27 DIAGNOSIS — E0590 Thyrotoxicosis, unspecified without thyrotoxic crisis or storm: Secondary | ICD-10-CM | POA: Diagnosis not present

## 2020-07-27 DIAGNOSIS — Z0000 Encounter for general adult medical examination without abnormal findings: Secondary | ICD-10-CM | POA: Diagnosis not present

## 2020-07-28 ENCOUNTER — Ambulatory Visit (HOSPITAL_COMMUNITY)
Admission: RE | Admit: 2020-07-28 | Discharge: 2020-07-28 | Disposition: A | Discharge status: Home / Self Care | Payer: Federal, State, Local not specified - PPO | Source: Ambulatory Visit | Attending: Gastroenterology | Admitting: Gastroenterology

## 2020-07-28 ENCOUNTER — Encounter (HOSPITAL_COMMUNITY): Payer: Self-pay

## 2020-07-28 DIAGNOSIS — K449 Diaphragmatic hernia without obstruction or gangrene: Secondary | ICD-10-CM | POA: Diagnosis not present

## 2020-07-28 DIAGNOSIS — R935 Abnormal findings on diagnostic imaging of other abdominal regions, including retroperitoneum: Secondary | ICD-10-CM

## 2020-07-28 DIAGNOSIS — R932 Abnormal findings on diagnostic imaging of liver and biliary tract: Secondary | ICD-10-CM | POA: Diagnosis not present

## 2020-07-28 DIAGNOSIS — K8689 Other specified diseases of pancreas: Secondary | ICD-10-CM | POA: Diagnosis not present

## 2020-07-28 DIAGNOSIS — K7689 Other specified diseases of liver: Secondary | ICD-10-CM | POA: Diagnosis not present

## 2020-07-28 DIAGNOSIS — K760 Fatty (change of) liver, not elsewhere classified: Secondary | ICD-10-CM | POA: Diagnosis not present

## 2020-07-28 LAB — POCT I-STAT CREATININE: Creatinine, Ser: 0.8 mg/dL (ref 0.44–1.00)

## 2020-07-28 MED ORDER — SODIUM CHLORIDE (PF) 0.9 % IJ SOLN
INTRAMUSCULAR | Status: AC
  Filled 2020-07-28: qty 50

## 2020-07-28 MED ORDER — IOHEXOL 300 MG/ML  SOLN
100.0000 mL | Freq: Once | INTRAMUSCULAR | Status: AC | PRN
  Administered 2020-07-28: 100 mL via INTRAVENOUS

## 2020-08-16 ENCOUNTER — Other Ambulatory Visit (INDEPENDENT_AMBULATORY_CARE_PROVIDER_SITE_OTHER): Payer: Federal, State, Local not specified - PPO

## 2020-08-16 DIAGNOSIS — R7989 Other specified abnormal findings of blood chemistry: Secondary | ICD-10-CM

## 2020-08-16 LAB — HEPATIC FUNCTION PANEL
ALT: 14 U/L (ref 0–35)
AST: 13 U/L (ref 0–37)
Albumin: 4.9 g/dL (ref 3.5–5.2)
Alkaline Phosphatase: 103 U/L (ref 39–117)
Bilirubin, Direct: 0.1 mg/dL (ref 0.0–0.3)
Total Bilirubin: 0.6 mg/dL (ref 0.2–1.2)
Total Protein: 7.8 g/dL (ref 6.0–8.3)

## 2020-08-16 LAB — GAMMA GT: GGT: 93 U/L — ABNORMAL HIGH (ref 7–51)

## 2020-08-18 LAB — MITOCHONDRIAL ANTIBODIES: Mitochondrial M2 Ab, IgG: <=20 U

## 2020-08-18 LAB — ANA: Anti Nuclear Antibody (ANA): NEGATIVE

## 2020-08-18 LAB — ANTI-SMOOTH MUSCLE ANTIBODY, IGG: Actin (Smooth Muscle) Antibody (IGG): <20 U (ref <20)

## 2020-08-24 ENCOUNTER — Other Ambulatory Visit: Payer: Self-pay

## 2020-08-24 DIAGNOSIS — R7989 Other specified abnormal findings of blood chemistry: Secondary | ICD-10-CM

## 2020-10-26 DIAGNOSIS — H16221 Keratoconjunctivitis sicca, not specified as Sjogren's, right eye: Secondary | ICD-10-CM | POA: Diagnosis not present

## 2020-11-08 DIAGNOSIS — R7309 Other abnormal glucose: Secondary | ICD-10-CM | POA: Diagnosis not present

## 2020-11-08 DIAGNOSIS — E785 Hyperlipidemia, unspecified: Secondary | ICD-10-CM | POA: Diagnosis not present

## 2020-11-10 ENCOUNTER — Other Ambulatory Visit: Payer: Self-pay | Admitting: Internal Medicine

## 2020-11-10 DIAGNOSIS — K760 Fatty (change of) liver, not elsewhere classified: Secondary | ICD-10-CM | POA: Diagnosis not present

## 2020-11-10 DIAGNOSIS — I10 Essential (primary) hypertension: Secondary | ICD-10-CM | POA: Diagnosis not present

## 2020-11-10 DIAGNOSIS — E785 Hyperlipidemia, unspecified: Secondary | ICD-10-CM

## 2020-11-10 DIAGNOSIS — I2510 Atherosclerotic heart disease of native coronary artery without angina pectoris: Secondary | ICD-10-CM

## 2020-11-10 DIAGNOSIS — E559 Vitamin D deficiency, unspecified: Secondary | ICD-10-CM | POA: Diagnosis not present

## 2020-11-10 DIAGNOSIS — R748 Abnormal levels of other serum enzymes: Secondary | ICD-10-CM | POA: Diagnosis not present

## 2020-11-10 DIAGNOSIS — I129 Hypertensive chronic kidney disease with stage 1 through stage 4 chronic kidney disease, or unspecified chronic kidney disease: Secondary | ICD-10-CM

## 2020-11-11 DIAGNOSIS — E559 Vitamin D deficiency, unspecified: Secondary | ICD-10-CM | POA: Diagnosis not present

## 2020-11-11 DIAGNOSIS — E210 Primary hyperparathyroidism: Secondary | ICD-10-CM | POA: Diagnosis not present

## 2020-11-15 ENCOUNTER — Other Ambulatory Visit (HOSPITAL_COMMUNITY): Payer: Self-pay | Admitting: Endocrinology

## 2020-11-15 DIAGNOSIS — E210 Primary hyperparathyroidism: Secondary | ICD-10-CM | POA: Diagnosis not present

## 2020-11-15 DIAGNOSIS — E559 Vitamin D deficiency, unspecified: Secondary | ICD-10-CM | POA: Diagnosis not present

## 2020-11-15 DIAGNOSIS — I129 Hypertensive chronic kidney disease with stage 1 through stage 4 chronic kidney disease, or unspecified chronic kidney disease: Secondary | ICD-10-CM | POA: Diagnosis not present

## 2020-11-25 DIAGNOSIS — G4733 Obstructive sleep apnea (adult) (pediatric): Secondary | ICD-10-CM | POA: Diagnosis not present

## 2020-11-30 ENCOUNTER — Other Ambulatory Visit: Payer: Self-pay

## 2020-11-30 ENCOUNTER — Encounter (HOSPITAL_COMMUNITY)
Admission: RE | Admit: 2020-11-30 | Discharge: 2020-11-30 | Disposition: A | Discharge status: Home / Self Care | Payer: Federal, State, Local not specified - PPO | Source: Ambulatory Visit | Attending: Endocrinology | Admitting: Endocrinology

## 2020-11-30 DIAGNOSIS — E210 Primary hyperparathyroidism: Secondary | ICD-10-CM | POA: Diagnosis not present

## 2020-11-30 MED ORDER — TECHNETIUM TC 99M SESTAMIBI - CARDIOLITE
26.2000 | Freq: Once | INTRAVENOUS | Status: AC | PRN
  Administered 2020-11-30: 11:00:00 26.2 via INTRAVENOUS

## 2020-12-15 ENCOUNTER — Ambulatory Visit: Payer: Federal, State, Local not specified - PPO | Admitting: Cardiology

## 2020-12-15 ENCOUNTER — Encounter: Payer: Self-pay | Admitting: Cardiology

## 2020-12-15 ENCOUNTER — Other Ambulatory Visit: Payer: Self-pay

## 2020-12-15 VITALS — BP 160/84 | HR 70 | Temp 98.1°F | Resp 16 | Ht 67.0 in | Wt 211.4 lb

## 2020-12-15 DIAGNOSIS — I700 Atherosclerosis of aorta: Secondary | ICD-10-CM | POA: Diagnosis not present

## 2020-12-15 DIAGNOSIS — R739 Hyperglycemia, unspecified: Secondary | ICD-10-CM | POA: Diagnosis not present

## 2020-12-15 DIAGNOSIS — I2510 Atherosclerotic heart disease of native coronary artery without angina pectoris: Secondary | ICD-10-CM | POA: Diagnosis not present

## 2020-12-15 DIAGNOSIS — I10 Essential (primary) hypertension: Secondary | ICD-10-CM | POA: Diagnosis not present

## 2020-12-15 MED ORDER — AMLODIPINE BESYLATE 10 MG PO TABS: 10.0000 mg | ORAL_TABLET | Freq: Every day | ORAL | 0 refills | Status: DC

## 2020-12-15 MED ORDER — SPIRONOLACTONE 25 MG PO TABS: 25.0000 mg | ORAL_TABLET | ORAL | 2 refills | Status: AC

## 2020-12-15 NOTE — Progress Notes (Signed)
Primary Physician/Referring:  Deland Pretty, MD  Patient ID: Alexandra Gibson, female    DOB: 1951/05/12, 70 y.o.   MRN: [id number]  Chief Complaint  Patient presents with  . Coronary Artery Disease  . New Patient (Initial Visit)    Referred by Deland Pretty, MD   HPI:    Alexandra Gibson  is a 70 y.o. African-American female patient with hypertension, hyperlipidemia, hyperglycemia, primary hyperparathyroidism, aortic atherosclerosis and coronary calcification noted on the CT scan of the abdomen performed on 11/10/2020 and is now referred to me for cardiac restratification.  Prior to COVID-19 pandemic, she was walking at least 3 to 4 months on a regular basis but has reduced her physical activity.  She has no specific complaints of dyspnea or chest pain.  She has been taking all her medications as prescribed.  Past Medical History:  Diagnosis Date  . Anxiety   . Anxiety and depression   . Arthritis   . Depression   . Fibromyalgia   . GERD (gastroesophageal reflux disease)   . History of kidney stones   . History of urinary tract infection   . Hyperlipidemia   . Hypertension   . Kidney stone    10-2016; " i had a kidney stone that wouldnt move so they had to go in to take it out "  . Numbness    right hand/comes and goes   . Osteoarthritis of knee    bil-gets injections  . Parathyroid tumor   . Pre-diabetes   . Urinary frequency   . Wears glasses    Past Surgical History:  Procedure Laterality Date  . APPENDECTOMY    . CYSTOSCOPY WITH STENT PLACEMENT Left 10/27/2015   Procedure: CYSTOSCOPY WITH STENT PLACEMENT;  Surgeon: Nickie Retort, MD;  Location: [facility];  Service: Urology;  Laterality: Left;  . JOINT REPLACEMENT    . KNEE ARTHROSCOPY     left  . LAPAROSCOPIC ENDOMETRIOSIS FULGURATION  early 1970s  . TOTAL HIP ARTHROPLASTY Left 08/04/2015   Procedure: TOTAL LEFT  HIP ARTHROPLASTY ANTERIOR APPROACH;  Surgeon: Gaynelle Arabian, MD;  Location: [facility];  Service:  Orthopedics;  Laterality: Left;  . TOTAL HIP ARTHROPLASTY Right 11/07/2017   Procedure: RIGHT TOTAL HIP ARTHROPLASTY ANTERIOR APPROACH;  Surgeon: Gaynelle Arabian, MD;  Location: [facility];  Service: Orthopedics;  Laterality: Right;  . TOTAL KNEE ARTHROPLASTY Left 06/29/2014   Procedure: TOTAL KNEE ARTHROPLASTY LEFT ;  Surgeon: Gearlean Alf, MD;  Location: [facility];  Service: Orthopedics;  Laterality: Left;  . TOTAL KNEE ARTHROPLASTY Right 09/18/2016   Procedure: RIGHT TOTAL KNEE ARTHROPLASTY;  Surgeon: Gaynelle Arabian, MD;  Location: [facility];  Service: Orthopedics;  Laterality: Right;  Adductor Block  . URETEROSCOPY WITH HOLMIUM LASER LITHOTRIPSY Left 10/27/2015   Procedure: URETEROSCOPY WITH HOLMIUM LASER LITHOTRIPSY;  Surgeon: Nickie Retort, MD;  Location: [facility];  Service: Urology;  Laterality: Left;   Family History  Problem Relation Age of Onset  . Hypertension Mother   . Colon cancer Neg Hx   . Kidney disease Neg Hx   . Bladder Cancer Neg Hx   . Esophageal cancer Neg Hx   . Pancreatic cancer Neg Hx   . Rectal cancer Neg Hx   . Stomach cancer Neg Hx     Social History   Tobacco Use  . Smoking status: Never Smoker  . Smokeless tobacco: Never Used  Substance Use Topics  . Alcohol use: No   Marital Status: Married  ROS  Review of Systems  Cardiovascular: Negative for chest pain, dyspnea on exertion and leg swelling.  Respiratory: Positive for snoring (sleep apnea on CPAP).   Musculoskeletal: Positive for arthritis.  Gastrointestinal: Negative for melena.   Objective  Blood pressure (!) 160/84, pulse 70, temperature 98.1 F (36.7 C), temperature source Temporal, resp. rate 16, height _0  (1.702 m), weight 211 lb 6.4 oz (95.9 kg), SpO2 96 %. Body mass index is 33.11 kg/m.  Vitals with BMI 12/15/2020 07/02/2020 02/19/2020  Height _1  _2  _3   Weight 211 lbs 6 oz 215 lbs 4 oz 209 lbs  BMI 33.1 17.79 39.03  Systolic 009 233 007  Diastolic 84 86 80  Pulse 70 64 58      Physical Exam  Constitutional: No distress.  Eyes: Conjunctivae are normal.  Neck: No JVD present.  Cardiovascular: Regular rhythm, normal heart sounds, intact distal pulses and normal pulses. Exam reveals no gallop.  No murmur heard. Pulmonary/Chest: Effort normal and breath sounds normal. She exhibits no tenderness.  Abdominal: Soft. Bowel sounds are normal.  Musculoskeletal:        General: No edema. Normal range of motion.     Cervical back: Neck supple.  Neurological: She is alert and oriented to person, place, and time.  Skin: Skin is warm.     Laboratory examination:   Recent Labs    07/28/20 0924  CREATININE 0.80   CrCl cannot be calculated (Patient's most recent lab result is older than the maximum 21 days allowed.).  CMP Latest Ref Rng & Units 08/16/2020 07/28/2020 07/02/2020  Glucose 65 - 99 mg/dL - - -  BUN 6 - 20 mg/dL - - -  Creatinine 0.44 - 1.00 mg/dL - 0.80 -  Sodium 135 - 145 mmol/L - - -  Potassium 3.5 - 5.1 mmol/L - - -  Chloride 101 - 111 mmol/L - - -  CO2 22 - 32 mmol/L - - -  Calcium 8.9 - 10.3 mg/dL - - -  Total Protein 6.0 - 8.3 g/dL 7.8 - 7.8  Total Bilirubin 0.2 - 1.2 mg/dL 0.6 - 0.8  Alkaline Phos 39 - 117 U/L 103 - 124(H)  AST 0 - 37 U/L 13 - 13  ALT 0 - 35 U/L 14 - 17   CBC Latest Ref Rng & Units 08/25/2018 11/08/2017 10/30/2017  WBC 4.0 - 10.5 K/uL 9.2 17.8(H) 8.0  Hemoglobin 12.0 - 15.0 g/dL 14.7 12.4 14.5  Hematocrit 36.0 - 46.0 % 47.7(H) 39.9 46.4(H)  Platelets 150 - 400 K/uL 247 226 298   External labs:   Labs 11/08/2020:  Serum glucose: 4 mg, BUN 13, creatinine 0.7, EGFR 106 mL, sodium 145, potassium 4.4, normal LFTs.  Calcium 10.0.  Vitamin D 55.  A1c 6.4%.  PTH 47, normal.  Hb 13.5/HCT 42.1, platelets 250.  Total cholesterol 181, triglycerides 67, HDL 54, LDL 115.  Medications and allergies   Allergies  Allergen Reactions  . Lipitor [Atorvastatin] Other (See Comments)    REACTION: leg cramp     Current Outpatient  Medications on File Prior to Visit  Medication Sig Dispense Refill  . celecoxib (CELEBREX) 200 MG capsule Take 200 mg by mouth daily.    . cholecalciferol (VITAMIN D3) 25 MCG (1000 UT) tablet Take 1,000 Units by mouth daily.    . fluticasone (FLONASE) 50 MCG/ACT nasal spray Place 1 spray into both nostrils daily as needed for allergies or rhinitis.    Marland Kitchen irbesartan (AVAPRO) 300 MG tablet Take  300 mg by mouth every morning.    . pantoprazole (PROTONIX) 40 MG tablet Take 40 mg by mouth daily.    . pravastatin (PRAVACHOL) 40 MG tablet Take 40 mg by mouth every evening.     No current facility-administered medications on file prior to visit.     Radiology:   CT of the abdomen with contrast 07/28/2020: 1. Hepatic steatosis with area of fatty sparing about the anterior medial segment of the LEFT hepatic lobe. 2. Lobulated pulmonary nodule in the LEFT lower lobe measures 11 x 7 mm and is unchanged dating back to 2017. Compatible with benign finding. 3. Coronary artery disease with calcification of LEFT coronary circulation. 4. Small hiatal hernia. 5. Scoliotic curvature of the spine with degenerative changes. Curvature may be slightly increased particularly in the thoracic spine. This could be due to patient positioning as well. 6. Aortic atherosclerosis.  Cardiac Studies:   None  EKG:     EKG 12/15/2020: Normal sinus rhythm at rate of 61 bpm, left atrial enlargement, left axis deviation, left intrafascicular block.  Incomplete right bundle branch block.      Assessment     ICD-10-CM   1. Primary hypertension  I10 EKG 12-Lead    amLODipine (NORVASC) 10 MG tablet    spironolactone (ALDACTONE) 25 MG tablet    Basic metabolic panel    PCV ECHOCARDIOGRAM COMPLETE    PCV CARDIAC STRESS TEST    Basic metabolic panel  2. Aortic atherosclerosis (HCC)  I70.0   3. Coronary artery calcification seen on CAT scan  I25.10 PCV ECHOCARDIOGRAM COMPLETE    PCV CARDIAC STRESS TEST     Meds  ordered this encounter  Medications  . amLODipine (NORVASC) 10 MG tablet    Sig: Take 1 tablet (10 mg total) by mouth daily.    Dispense:  90 tablet    Refill:  0  . spironolactone (ALDACTONE) 25 MG tablet    Sig: Take 1 tablet (25 mg total) by mouth every morning.    Dispense:  30 tablet    Refill:  2   Orders Placed This Encounter  Procedures  . Basic metabolic panel    Standing Status:   Future    Number of Occurrences:   1    Standing Expiration Date:   12/15/2021  . PCV CARDIAC STRESS TEST    Standing Status:   Future    Standing Expiration Date:   02/14/2021  . EKG 12-Lead  . PCV ECHOCARDIOGRAM COMPLETE    Standing Status:   Future    Standing Expiration Date:   12/15/2021   Recommendations:   Alexandra Gibson is a 70 y.o. African-American female patient with hypertension, hyperlipidemia, hyperglycemia, primary hyperparathyroidism, aortic atherosclerosis and coronary calcification noted on the CT scan of the abdomen performed on 11/10/2020 and is now referred to me for cardiac restratification.  Patient fairly active previously until COVID-19 and is physically reduced her activity to being extremely sedentary.  She remains asymptomatic without chest pain or dyspnea.  However she does have significant cardiovascular risk factors including hyperglycemia, uncontrolled hypertension, hyperlipidemia and age as risk factor.  I have increased amlodipine from 5 mg to 10 mg daily and added spironolactone 25 mg in the morning and will obtain BMP in 2 weeks.  With regard to cardiovascular stratification, I have recommended a routine treadmill exercise stress test and also would like to obtain an echocardiogram specifically to look for hypertensive heart disease and diastolic function.  I would like  to see her back in 6 weeks for follow-up.    Adrian Prows, MD, [facility] 12/15/2020, 11:10 AM Office: [phone]

## 2020-12-21 DIAGNOSIS — E210 Primary hyperparathyroidism: Secondary | ICD-10-CM | POA: Diagnosis not present

## 2020-12-22 ENCOUNTER — Ambulatory Visit: Payer: Self-pay | Admitting: Surgery

## 2020-12-23 ENCOUNTER — Other Ambulatory Visit: Payer: Self-pay | Admitting: Surgery

## 2020-12-23 DIAGNOSIS — D351 Benign neoplasm of parathyroid gland: Secondary | ICD-10-CM

## 2020-12-31 ENCOUNTER — Other Ambulatory Visit: Payer: Medicare Other

## 2021-01-03 ENCOUNTER — Other Ambulatory Visit: Payer: Federal, State, Local not specified - PPO

## 2021-01-06 ENCOUNTER — Ambulatory Visit
Admission: RE | Admit: 2021-01-06 | Discharge: 2021-01-06 | Disposition: A | Discharge status: Home / Self Care | Payer: No Typology Code available for payment source | Source: Ambulatory Visit | Attending: Internal Medicine | Admitting: Internal Medicine

## 2021-01-06 ENCOUNTER — Ambulatory Visit
Admission: RE | Admit: 2021-01-06 | Discharge: 2021-01-06 | Disposition: A | Discharge status: Home / Self Care | Payer: No Typology Code available for payment source | Source: Ambulatory Visit | Attending: Surgery | Admitting: Surgery

## 2021-01-06 DIAGNOSIS — I700 Atherosclerosis of aorta: Secondary | ICD-10-CM | POA: Diagnosis not present

## 2021-01-06 DIAGNOSIS — I2510 Atherosclerotic heart disease of native coronary artery without angina pectoris: Secondary | ICD-10-CM

## 2021-01-06 DIAGNOSIS — D351 Benign neoplasm of parathyroid gland: Secondary | ICD-10-CM

## 2021-01-06 DIAGNOSIS — E785 Hyperlipidemia, unspecified: Secondary | ICD-10-CM | POA: Diagnosis not present

## 2021-01-06 DIAGNOSIS — E041 Nontoxic single thyroid nodule: Secondary | ICD-10-CM | POA: Diagnosis not present

## 2021-01-12 NOTE — Patient Instructions (Signed)
DUE TO COVID-19 ONLY ONE VISITOR IS ALLOWED TO COME WITH YOU AND STAY IN THE WAITING ROOM ONLY DURING PRE OP AND PROCEDURE DAY OF SURGERY. THE 1 VISITOR  MAY VISIT WITH YOU AFTER SURGERY IN YOUR PRIVATE ROOM DURING VISITING HOURS ONLY!               Alexandra Gibson   Your procedure is scheduled on: 01/21/21   Report to [facility] Main  Entrance   Report to admitting at: 11:45 AM     Call this number if you have problems the morning of surgery [phone]    Remember: NO SOLID FOOD AFTER MIDNIGHT THE NIGHT PRIOR TO SURGERY. NOTHING BY MOUTH EXCEPT CLEAR LIQUIDS UNTIL: 10:45 AM .   CLEAR LIQUID DIET  Foods Allowed                                                                     Foods Excluded  Coffee and tea, regular and decaf                             liquids that you cannot  Plain Jell-O any favor except red or purple                                           see through such as: Fruit ices (not with fruit pulp)                                     milk, soups, orange juice  Iced Popsicles                                    All solid food Carbonated beverages, regular and diet                                    Cranberry, grape and apple juices Sports drinks like Gatorade Lightly seasoned clear broth or consume(fat free) Sugar, honey syrup  Sample Menu Breakfast                                Lunch                                     Supper Cranberry juice                    Beef broth                            Chicken broth Jell-O  Grape juice                           Apple juice Coffee or tea                        Jell-O                                      Popsicle                                                Coffee or tea                        Coffee or tea  _____________________________________________________________________  BRUSH YOUR TEETH MORNING OF SURGERY AND RINSE YOUR MOUTH OUT, NO CHEWING GUM CANDY OR  MINTS.    Take these medicines the morning of surgery with A SIP OF WATER: amlodipine,pantoprazole.Use eye drops as usual.                               You may not have any metal on your body including hair pins and              piercings  Do not wear jewelry, make-up, lotions, powders or perfumes, deodorant             Do not wear nail polish on your fingernails.  Do not shave  48 hours prior to surgery.    Do not bring valuables to the hospital. [place].  Contacts, dentures or bridgework may not be worn into surgery.  Leave suitcase in the car. After surgery it may be brought to your room.     Patients discharged the day of surgery will not be allowed to drive home. IF YOU ARE HAVING SURGERY AND GOING HOME THE SAME DAY, YOU MUST HAVE AN ADULT TO DRIVE YOU HOME AND BE WITH YOU FOR 24 HOURS. YOU MAY GO HOME BY TAXI OR UBER OR ORTHERWISE, BUT AN ADULT MUST ACCOMPANY YOU HOME AND STAY WITH YOU FOR 24 HOURS.  Name and phone number of your driver:  Special Instructions: N/A              Please read over the following fact sheets you were given: _____________________________________________________________________         [facility] - Preparing for Surgery Before surgery, you can play an important role.  Because skin is not sterile, your skin needs to be as free of germs as possible.  You can reduce the number of germs on your skin by washing with CHG (chlorahexidine gluconate) soap before surgery.  CHG is an antiseptic cleaner which kills germs and bonds with the skin to continue killing germs even after washing. Please DO NOT use if you have an allergy to CHG or antibacterial soaps.  If your skin becomes reddened/irritated stop using the CHG and inform your nurse when you arrive at Short Stay. Do not shave (including legs and underarms) for at least 48 hours prior to the  first CHG shower.  You may shave your face/neck. Please follow these  instructions carefully:  1.  Shower with CHG Soap the night before surgery and the  morning of Surgery.  2.  If you choose to wash your hair, wash your hair first as usual with your  normal  shampoo.  3.  After you shampoo, rinse your hair and body thoroughly to remove the  shampoo.                           4.  Use CHG as you would any other liquid soap.  You can apply chg directly  to the skin and wash                       Gently with a scrungie or clean washcloth.  5.  Apply the CHG Soap to your body ONLY FROM THE NECK DOWN.   Do not use on face/ open                           Wound or open sores. Avoid contact with eyes, ears mouth and genitals (private parts).                       Wash face,  Genitals (private parts) with your normal soap.             6.  Wash thoroughly, paying special attention to the area where your surgery  will be performed.  7.  Thoroughly rinse your body with warm water from the neck down.  8.  DO NOT shower/wash with your normal soap after using and rinsing off  the CHG Soap.                9.  Pat yourself dry with a clean towel.            10.  Wear clean pajamas.            11.  Place clean sheets on your bed the night of your first shower and do not  sleep with pets. Day of Surgery : Do not apply any lotions/deodorants the morning of surgery.  Please wear clean clothes to the hospital/surgery center.  FAILURE TO FOLLOW THESE INSTRUCTIONS MAY RESULT IN THE CANCELLATION OF YOUR SURGERY PATIENT SIGNATURE_________________________________  NURSE SIGNATURE__________________________________  ________________________________________________________________________

## 2021-01-13 ENCOUNTER — Encounter (HOSPITAL_COMMUNITY): Payer: Self-pay

## 2021-01-13 ENCOUNTER — Other Ambulatory Visit: Payer: Self-pay

## 2021-01-13 ENCOUNTER — Encounter (HOSPITAL_COMMUNITY)
Admission: RE | Admit: 2021-01-13 | Discharge: 2021-01-13 | Disposition: A | Discharge status: Home / Self Care | Payer: Federal, State, Local not specified - PPO | Source: Ambulatory Visit | Attending: Surgery | Admitting: Surgery

## 2021-01-13 DIAGNOSIS — I129 Hypertensive chronic kidney disease with stage 1 through stage 4 chronic kidney disease, or unspecified chronic kidney disease: Secondary | ICD-10-CM | POA: Diagnosis not present

## 2021-01-13 DIAGNOSIS — Z01812 Encounter for preprocedural laboratory examination: Secondary | ICD-10-CM | POA: Insufficient documentation

## 2021-01-13 DIAGNOSIS — E785 Hyperlipidemia, unspecified: Secondary | ICD-10-CM | POA: Diagnosis not present

## 2021-01-13 DIAGNOSIS — I700 Atherosclerosis of aorta: Secondary | ICD-10-CM | POA: Diagnosis not present

## 2021-01-13 DIAGNOSIS — K219 Gastro-esophageal reflux disease without esophagitis: Secondary | ICD-10-CM | POA: Diagnosis not present

## 2021-01-13 DIAGNOSIS — I2510 Atherosclerotic heart disease of native coronary artery without angina pectoris: Secondary | ICD-10-CM | POA: Diagnosis not present

## 2021-01-13 LAB — BASIC METABOLIC PANEL
Anion gap: 6 (ref 5–15)
BUN: 23 mg/dL (ref 8–23)
CO2: 26 mmol/L (ref 22–32)
Calcium: 10.3 mg/dL (ref 8.9–10.3)
Chloride: 108 mmol/L (ref 98–111)
Creatinine, Ser: 0.79 mg/dL (ref 0.44–1.00)
GFR, Estimated: >60 mL/min (ref >60)
Glucose, Bld: 106 mg/dL — ABNORMAL HIGH (ref 70–99)
Potassium: 4.5 mmol/L (ref 3.5–5.1)
Sodium: 140 mmol/L (ref 135–145)

## 2021-01-13 LAB — CBC
HCT: 46.5 % — ABNORMAL HIGH (ref 36.0–46.0)
Hemoglobin: 14.3 g/dL (ref 12.0–15.0)
MCH: 29.2 pg (ref 26.0–34.0)
MCHC: 30.8 g/dL (ref 30.0–36.0)
MCV: 94.9 fL (ref 80.0–100.0)
Platelets: 272 10*3/uL (ref 150–400)
RBC: 4.9 MIL/uL (ref 3.87–5.11)
RDW: 13.6 % (ref 11.5–15.5)
WBC: 8.8 10*3/uL (ref 4.0–10.5)
nRBC: 0 % (ref 0.0–0.2)

## 2021-01-13 NOTE — Progress Notes (Signed)
COVID Vaccine Completed: Yes Date COVID Vaccine completed: 05/11/21 [place] COVID vaccine manufacturer: Pfizer      PCP - Dr. Deland Pretty Cardiologist - Dr. Adrian Prows: LOV: 12/15/20  Chest x-ray -  EKG - 12/15/20 Stress Test -  ECHO -  Cardiac Cath -  Pacemaker/ICD device last checked:  Sleep Study - Yes CPAP -  Yes  Fasting Blood Sugar -  Checks Blood Sugar _____ times a day  Blood Thinner Instructions: Aspirin Instructions: Last Dose:  Anesthesia review: Hx: OSA(CPAP),HTN,pre-DIA.  Patient denies shortness of breath, fever, cough and chest pain at PAT appointment   Patient verbalized understanding of instructions that were given to them at the PAT appointment. Patient was also instructed that they will need to review over the PAT instructions again at home before surgery.

## 2021-01-14 LAB — HEMOGLOBIN A1C
Hgb A1c MFr Bld: 6.7 % — ABNORMAL HIGH (ref 4.8–5.6)
Mean Plasma Glucose: 146 mg/dL

## 2021-01-16 ENCOUNTER — Encounter (HOSPITAL_COMMUNITY): Payer: Self-pay | Admitting: Surgery

## 2021-01-16 DIAGNOSIS — E210 Primary hyperparathyroidism: Secondary | ICD-10-CM | POA: Diagnosis present

## 2021-01-16 NOTE — H&P (Signed)
General Surgery [facility]  Simranjit Thayer DOB: 08/04/1951 Married / Language: English / Race: American [place] or [place] Native Female   History of Present Illness  The patient is a 70 year old female who presents with primary hyperparathyroidism.  CHIEF COMPLAINT: primary hyperparathyroidism  Patient is referred by Dr. Jacelyn Pi for surgical evaluation and management of primary hyperparathyroidism. Patient was originally diagnosed several years ago by Dr. Thomos Lemons. She had undergone a nuclear medicine parathyroid scan in 2018. This had indicated a left inferior parathyroid adenoma. Recent laboratory studies showed an elevated calcium level of 11.6 and an elevated intact PTH level of 64. Vitamin D was normal at 33. Patient underwent a repeat parathyroid scan on November 30, 2020. This again confirmed a left inferior parathyroid adenoma. Patient presents at this time to discuss surgical intervention. Patient has had complications including chronic fatigue and nephrolithiasis. Bone density scan was normal. Patient has had no prior head or neck surgery. She has not had an ultrasound examination of the neck.   Past Surgical History  Appendectomy  Cataract Surgery  Bilateral. Colon Polyp Removal - Colonoscopy  Hip Surgery  Bilateral. Knee Surgery  Bilateral.  Diagnostic Studies History  Colonoscopy  >10 years ago Pap Smear  >5 years ago  Allergies  No Known Drug Allergies  Lipitor *ANTIHYPERLIPIDEMICS*  Allergies Reconciled   Medication History amLODIPine-Atorvastatin (10-10MG  Tablet, Oral) Active. Spironolactone (25MG /5ML Suspension, Oral) Active. Celecoxib (50MG  Capsule, Oral) Active. Clobetasol 17 Propionate (0.5% Powder,) Active. Irbesartan (75MG  Tablet, Oral) Active. Nystatin (100000 UNIT/GM Cream, External) Active. Pantoprazole Sodium (40MG  Packet, Oral) Active. Pravastatin Sodium (10MG  Tablet, Oral) Active. Medications  Reconciled  Social History Caffeine use  Coffee. No alcohol use  No drug use  Tobacco use  Never smoker.  Family History  Alcohol Abuse  Brother. Arthritis  Father, Mother. Hypertension  Father, Mother.  Pregnancy / Birth History  Age at menarche  1 years. Age of menopause  48-55 Gravida  2 Length (months) of breastfeeding  3-6 Para  2  Other Problems  Anxiety Disorder  Back Pain  Gastroesophageal Reflux Disease  High blood pressure  Hypercholesterolemia   Review of Systems  General Present- Fatigue and Weight Gain. Not Present- Appetite Loss, Chills, Fever, Night Sweats and Weight Loss. HEENT Present- Seasonal Allergies. Not Present- Earache, Hearing Loss, Hoarseness, Nose Bleed, Oral Ulcers, Ringing in the Ears, Sinus Pain, Sore Throat, Visual Disturbances, Wears glasses/contact lenses and Yellow Eyes. Breast Not Present- Breast Mass, Breast Pain, Nipple Discharge and Skin Changes. Cardiovascular Not Present- Chest Pain, Difficulty Breathing Lying Down, Leg Cramps, Palpitations, Rapid Heart Rate, Shortness of Breath and Swelling of Extremities. Gastrointestinal Not Present- Abdominal Pain, Bloating, Bloody Stool, Change in Bowel Habits, Chronic diarrhea, Constipation, Difficulty Swallowing, Excessive gas, Gets full quickly at meals, Hemorrhoids, Indigestion, Nausea, Rectal Pain and Vomiting. Musculoskeletal Present- Joint Stiffness. Not Present- Back Pain, Joint Pain, Muscle Pain, Muscle Weakness and Swelling of Extremities. Neurological Present- Numbness. Not Present- Decreased Memory, Fainting, Headaches, Seizures, Tingling, Tremor, Trouble walking and Weakness. Endocrine Not Present- Cold Intolerance, Excessive Hunger, Hair Changes, Heat Intolerance, Hot flashes and New Diabetes. Hematology Not Present- Blood Thinners, Easy Bruising, Excessive bleeding, Gland problems, HIV and Persistent Infections.  Vitals Weight: 208.13 lb Height: 67in Body  Surface Area: 2.06 m Body Mass Index: 32.6 kg/m  Temp.: 98.79F  Pulse: 84 (Regular)  P.OX: 95% (Room air) BP: 124/84(Sitting, Left Arm, Standard)  Physical Exam  GENERAL APPEARANCE Development: normal Nutritional status: normal Gross deformities: none  SKIN Rash, lesions, ulcers: none Induration, erythema: none Nodules: none palpable  EYES Conjunctiva and lids: normal Pupils: equal and reactive Iris: normal bilaterally  EARS, NOSE, MOUTH, THROAT External ears: no lesion or deformity External nose: no lesion or deformity Hearing: grossly normal Due to Covid-19 pandemic, patient is wearing a mask.  NECK Symmetric: yes Trachea: midline Thyroid: no palpable nodules in the thyroid bed  CHEST Respiratory effort: normal Retraction or accessory muscle use: no Breath sounds: normal bilaterally Rales, rhonchi, wheeze: none  CARDIOVASCULAR Auscultation: regular rhythm, normal rate Murmurs: none Pulses: radial pulse 2+ palpable Lower extremity edema: none  MUSCULOSKELETAL Station and gait: normal Digits and nails: no clubbing or cyanosis Muscle strength: grossly normal all extremities Range of motion: grossly normal all extremities Deformity: none  LYMPHATIC Cervical: none palpable Supraclavicular: none palpable  PSYCHIATRIC Oriented to person, place, and time: yes Mood and affect: normal for situation Judgment and insight: appropriate for situation    Assessment & Plan   PRIMARY HYPERPARATHYROIDISM (E21.0)  Patient is referred by her endocrinologist for surgical evaluation and management of primary hyperparathyroidism.  Patient provided with a copy of "Parathyroid Surgery: Treatment for Your Parathyroid Gland Problem", published by [employer], 12 pages. Book reviewed and explained to patient during visit today.  Patient has biochemical evidence of primary hyperparathyroidism. Nuclear medicine parathyroid scan has localized a left inferior  parathyroid adenoma. I would like to obtain an ultrasound examination of the neck to rule out any concurrent thyroid disease. Hopefully this study may also visualized the left inferior adenoma. We will obtain that study in the near future.  We discussed minimally invasive parathyroidectomy. I explained that there is a 95% chance that she has single gland disease. We discussed the minimally invasive approach and the size and location of her surgical incision. We discussed risk and benefits of surgery including the potential for recurrent laryngeal nerve injury. We discussed the hospital stay to be anticipated and her postoperative recovery and return to activities. She understands and wishes to proceed with surgery in the near future.  The risks and benefits of the procedure have been discussed at length with the patient. The patient understands the proposed procedure, potential alternative treatments, and the course of recovery to be expected. All of the patient's questions have been answered at this time. The patient wishes to proceed with surgery.  Armandina Gemma, MD [facility] Office: [phone]

## 2021-01-20 ENCOUNTER — Encounter (HOSPITAL_COMMUNITY): Payer: Self-pay | Admitting: Surgery

## 2021-01-21 ENCOUNTER — Encounter (HOSPITAL_COMMUNITY): Payer: Self-pay | Admitting: Surgery

## 2021-01-21 ENCOUNTER — Encounter (HOSPITAL_COMMUNITY)
Admission: RE | Disposition: A | Discharge status: Home / Self Care | Payer: Self-pay | Source: Home / Self Care | Attending: Surgery

## 2021-01-21 ENCOUNTER — Ambulatory Visit (HOSPITAL_COMMUNITY): Payer: Federal, State, Local not specified - PPO | Admitting: Anesthesiology

## 2021-01-21 ENCOUNTER — Other Ambulatory Visit: Payer: Medicare Other

## 2021-01-21 ENCOUNTER — Ambulatory Visit (HOSPITAL_COMMUNITY)
Admission: RE | Admit: 2021-01-21 | Discharge: 2021-01-21 | Disposition: A | Discharge status: Home / Self Care | Payer: Federal, State, Local not specified - PPO | Attending: Surgery | Admitting: Surgery

## 2021-01-21 DIAGNOSIS — Z791 Long term (current) use of non-steroidal anti-inflammatories (NSAID): Secondary | ICD-10-CM | POA: Diagnosis not present

## 2021-01-21 DIAGNOSIS — E210 Primary hyperparathyroidism: Secondary | ICD-10-CM | POA: Insufficient documentation

## 2021-01-21 DIAGNOSIS — D351 Benign neoplasm of parathyroid gland: Secondary | ICD-10-CM | POA: Diagnosis not present

## 2021-01-21 DIAGNOSIS — I10 Essential (primary) hypertension: Secondary | ICD-10-CM | POA: Diagnosis not present

## 2021-01-21 DIAGNOSIS — Z79899 Other long term (current) drug therapy: Secondary | ICD-10-CM | POA: Insufficient documentation

## 2021-01-21 DIAGNOSIS — Z87442 Personal history of urinary calculi: Secondary | ICD-10-CM | POA: Insufficient documentation

## 2021-01-21 DIAGNOSIS — Z888 Allergy status to other drugs, medicaments and biological substances status: Secondary | ICD-10-CM | POA: Diagnosis not present

## 2021-01-21 DIAGNOSIS — E7800 Pure hypercholesterolemia, unspecified: Secondary | ICD-10-CM | POA: Diagnosis not present

## 2021-01-21 DIAGNOSIS — G4733 Obstructive sleep apnea (adult) (pediatric): Secondary | ICD-10-CM | POA: Diagnosis not present

## 2021-01-21 HISTORY — PX: PARATHYROIDECTOMY: SHX19

## 2021-01-21 SURGERY — PARATHYROIDECTOMY
Anesthesia: General | Site: Neck | Laterality: Left

## 2021-01-21 MED ORDER — PROPOFOL 10 MG/ML IV BOLUS
INTRAVENOUS | Status: DC | PRN
  Administered 2021-01-21: 140 mg via INTRAVENOUS

## 2021-01-21 MED ORDER — CHLORHEXIDINE GLUCONATE 0.12 % MT SOLN
15.0000 mL | Freq: Once | OROMUCOSAL | Status: AC
  Administered 2021-01-21: 15 mL via OROMUCOSAL

## 2021-01-21 MED ORDER — SUGAMMADEX SODIUM 200 MG/2ML IV SOLN
INTRAVENOUS | Status: DC | PRN
  Administered 2021-01-21: 400 mg via INTRAVENOUS

## 2021-01-21 MED ORDER — ESMOLOL HCL 100 MG/10ML IV SOLN
INTRAVENOUS | Status: DC | PRN
  Administered 2021-01-21 (×2): 10 mg via INTRAVENOUS

## 2021-01-21 MED ORDER — FENTANYL CITRATE (PF) 100 MCG/2ML IJ SOLN
INTRAMUSCULAR | Status: AC
  Filled 2021-01-21: qty 2

## 2021-01-21 MED ORDER — FENTANYL CITRATE (PF) 100 MCG/2ML IJ SOLN
25.0000 ug | INTRAMUSCULAR | Status: DC | PRN
  Administered 2021-01-21 (×2): 25 ug via INTRAVENOUS

## 2021-01-21 MED ORDER — PROMETHAZINE HCL 25 MG/ML IJ SOLN: 6.2500 mg | INTRAMUSCULAR | Status: DC | PRN

## 2021-01-21 MED ORDER — ORAL CARE MOUTH RINSE: 15.0000 mL | Freq: Once | OROMUCOSAL | Status: AC

## 2021-01-21 MED ORDER — 0.9 % SODIUM CHLORIDE (POUR BTL) OPTIME
TOPICAL | Status: DC | PRN
  Administered 2021-01-21: 1000 mL

## 2021-01-21 MED ORDER — DEXAMETHASONE SODIUM PHOSPHATE 10 MG/ML IJ SOLN
INTRAMUSCULAR | Status: DC | PRN
  Administered 2021-01-21: 8 mg via INTRAVENOUS

## 2021-01-21 MED ORDER — CHLORHEXIDINE GLUCONATE CLOTH 2 % EX PADS: 6.0000 | MEDICATED_PAD | Freq: Once | CUTANEOUS | Status: DC

## 2021-01-21 MED ORDER — BUPIVACAINE HCL 0.25 % IJ SOLN
INTRAMUSCULAR | Status: AC
  Filled 2021-01-21: qty 1

## 2021-01-21 MED ORDER — ONDANSETRON HCL 4 MG/2ML IJ SOLN
INTRAMUSCULAR | Status: DC | PRN
  Administered 2021-01-21: 4 mg via INTRAVENOUS

## 2021-01-21 MED ORDER — AMISULPRIDE (ANTIEMETIC) 5 MG/2ML IV SOLN: 10.0000 mg | Freq: Once | INTRAVENOUS | Status: DC | PRN

## 2021-01-21 MED ORDER — BUPIVACAINE HCL 0.25 % IJ SOLN
INTRAMUSCULAR | Status: DC | PRN
  Administered 2021-01-21: 10 mL

## 2021-01-21 MED ORDER — FENTANYL CITRATE (PF) 100 MCG/2ML IJ SOLN
INTRAMUSCULAR | Status: AC
  Administered 2021-01-21: 25 ug via INTRAVENOUS
  Filled 2021-01-21: qty 2

## 2021-01-21 MED ORDER — PHENYLEPHRINE HCL-NACL 10-0.9 MG/250ML-% IV SOLN
INTRAVENOUS | Status: DC | PRN
  Administered 2021-01-21: 25 ug/min via INTRAVENOUS

## 2021-01-21 MED ORDER — ROCURONIUM BROMIDE 10 MG/ML (PF) SYRINGE
PREFILLED_SYRINGE | INTRAVENOUS | Status: DC | PRN
  Administered 2021-01-21: 80 mg via INTRAVENOUS

## 2021-01-21 MED ORDER — LACTATED RINGERS IV SOLN: INTRAVENOUS | Status: DC

## 2021-01-21 MED ORDER — TRAMADOL HCL 50 MG PO TABS: 50.0000 mg | ORAL_TABLET | Freq: Four times a day (QID) | ORAL | 0 refills | Status: DC | PRN

## 2021-01-21 MED ORDER — LIDOCAINE 2% (20 MG/ML) 5 ML SYRINGE
INTRAMUSCULAR | Status: DC | PRN
  Administered 2021-01-21: 100 mg via INTRAVENOUS

## 2021-01-21 MED ORDER — CEFAZOLIN SODIUM-DEXTROSE 2-4 GM/100ML-% IV SOLN
2.0000 g | INTRAVENOUS | Status: AC
  Administered 2021-01-21: 2 g via INTRAVENOUS
  Filled 2021-01-21: qty 100

## 2021-01-21 MED ORDER — FENTANYL CITRATE (PF) 100 MCG/2ML IJ SOLN
INTRAMUSCULAR | Status: DC | PRN
  Administered 2021-01-21: 100 ug via INTRAVENOUS

## 2021-01-21 SURGICAL SUPPLY — 33 items
ADH SKN CLS APL DERMABOND .7 (GAUZE/BANDAGES/DRESSINGS) ×1
APL PRP STRL LF DISP 70% ISPRP (MISCELLANEOUS) ×1
ATTRACTOMAT 16X20 MAGNETIC DRP (DRAPES) ×2 IMPLANT
BLADE SURG 15 STRL LF DISP TIS (BLADE) ×1 IMPLANT
BLADE SURG 15 STRL SS (BLADE) ×2
CHLORAPREP W/TINT 26 (MISCELLANEOUS) ×2 IMPLANT
CLIP VESOCCLUDE MED 6/CT (CLIP) ×4 IMPLANT
CLIP VESOCCLUDE SM WIDE 6/CT (CLIP) ×4 IMPLANT
COVER SURGICAL LIGHT HANDLE (MISCELLANEOUS) ×2 IMPLANT
COVER WAND RF STERILE (DRAPES) ×2 IMPLANT
DERMABOND ADVANCED (GAUZE/BANDAGES/DRESSINGS) ×1
DERMABOND ADVANCED .7 DNX12 (GAUZE/BANDAGES/DRESSINGS) ×1 IMPLANT
DRAPE LAPAROTOMY T 98X78 PEDS (DRAPES) ×2 IMPLANT
DRAPE UTILITY XL STRL (DRAPES) ×2 IMPLANT
ELECT REM PT RETURN 15FT ADLT (MISCELLANEOUS) ×2 IMPLANT
GAUZE 4X4 16PLY RFD (DISPOSABLE) ×2 IMPLANT
GLOVE SURG ORTHO LTX SZ8 (GLOVE) ×2 IMPLANT
GOWN STRL REUS W/TWL XL LVL3 (GOWN DISPOSABLE) ×6 IMPLANT
HEMOSTAT SURGICEL 2X4 FIBR (HEMOSTASIS) ×2 IMPLANT
ILLUMINATOR WAVEGUIDE N/F (MISCELLANEOUS) IMPLANT
KIT BASIN OR (CUSTOM PROCEDURE TRAY) ×2 IMPLANT
KIT TURNOVER KIT A (KITS) ×2 IMPLANT
NDL HYPO 25X1 1.5 SAFETY (NEEDLE) ×1 IMPLANT
NEEDLE HYPO 25X1 1.5 SAFETY (NEEDLE) ×2 IMPLANT
PACK BASIC VI WITH GOWN DISP (CUSTOM PROCEDURE TRAY) ×2 IMPLANT
PENCIL SMOKE EVACUATOR (MISCELLANEOUS) ×2 IMPLANT
SUT MNCRL AB 4-0 PS2 18 (SUTURE) ×2 IMPLANT
SUT VIC AB 3-0 SH 18 (SUTURE) ×2 IMPLANT
SYR BULB IRRIG 60ML STRL (SYRINGE) ×2 IMPLANT
SYR CONTROL 10ML LL (SYRINGE) ×2 IMPLANT
TOWEL OR 17X26 10 PK STRL BLUE (TOWEL DISPOSABLE) ×2 IMPLANT
TOWEL OR NON WOVEN STRL DISP B (DISPOSABLE) ×2 IMPLANT
TUBING CONNECTING 10 (TUBING) ×2 IMPLANT

## 2021-01-21 NOTE — Anesthesia Procedure Notes (Signed)
Procedure Name: Intubation Date/Time: 01/21/2021 2:16 PM Performed by: Lavina Hamman, CRNA Pre-anesthesia Checklist: Patient identified, Emergency Drugs available, Suction available, Patient being monitored and Timeout performed Patient Re-evaluated:Patient Re-evaluated prior to induction Oxygen Delivery Method: Circle system utilized Preoxygenation: Pre-oxygenation with 100% oxygen Induction Type: IV induction Ventilation: Mask ventilation without difficulty Laryngoscope Size: Mac and 3 Grade View: Grade II Tube type: Oral Tube size: 7.0 mm Number of attempts: 1 Airway Equipment and Method: Stylet Placement Confirmation: ETT inserted through vocal cords under direct vision, positive ETCO2, CO2 detector and breath sounds checked- equal and bilateral Secured at: 21 cm Tube secured with: Tape Dental Injury: Teeth and Oropharynx as per pre-operative assessment  Comments: ATOI

## 2021-01-21 NOTE — Interval H&P Note (Signed)
History and Physical Interval Note:  01/21/2021 1:42 PM  Alexandra Gibson  has presented today for surgery, with the diagnosis of PRIMARY HYPERPARATHYROIDISM.  The various methods of treatment have been discussed with the patient and family. After consideration of risks, benefits and other options for treatment, the patient has consented to    Procedure(s): LEFT INFERIOR PARATHYROIDECTOMY (Left) as a surgical intervention.    The patient's history has been reviewed, patient examined, no change in status, stable for surgery.  I have reviewed the patient's chart and labs.  Questions were answered to the patient's satisfaction.    Armandina Gemma, MD [facility] Office: [place]

## 2021-01-21 NOTE — Discharge Instructions (Addendum)
[facility]  THYROID & PARATHYROID SURGERY:  POST-OP INSTRUCTIONS  Always review your discharge instruction sheet from the facility where your surgery was performed.  A prescription for pain medication may be given to you upon discharge.  Take your pain medication as prescribed.  If narcotic pain medicine is not needed, then you may take acetaminophen (Tylenol) or ibuprofen (Advil) as needed.  Take your usually prescribed medications unless otherwise directed.  If you need a refill on your pain medication, please contact our office during regular business hours.  Prescriptions cannot be processed by our office after 5 pm or on weekends.  Start with a light diet upon arrival home, such as soup and crackers or toast.  Be sure to drink plenty of fluids daily.  Resume your normal diet the day after surgery.  Most patients will experience some swelling and bruising on the chest and neck area.  Ice packs will help.  Swelling and bruising can take several days to resolve.   It is common to experience some constipation after surgery.  Increasing fluid intake and taking a stool softener (Colace) will usually help or prevent this problem.  A mild laxative (Milk of Magnesia or Miralax) should be taken according to package directions if there has been no bowel movement after 48 hours.  You have steri-strips and a gauze dressing over your incision.  You may remove the gauze bandage on the second day after surgery, and you may shower at that time.  Leave your steri-strips (small skin tapes) in place directly over the incision.  These strips should remain on the skin for 5-7 days and then be removed.  You may get them wet in the shower and pat them dry.  You may resume regular (light) daily activities beginning the next day (such as daily self-care, walking, climbing stairs) gradually increasing activities as tolerated.  You may have sexual intercourse when it is comfortable.  Refrain from  any heavy lifting or straining until approved by your doctor.  You may drive when you no longer are taking prescription pain medication, you can comfortably wear a seatbelt, and you can safely maneuver your car and apply brakes.  You should see your doctor in the office for a follow-up appointment approximately three weeks after your surgery.  Make sure that you call for this appointment within a day or two after you arrive home to insure a convenient appointment time.  WHEN TO CALL YOUR DOCTOR: -- Fever greater than 101.5 -- Inability to urinate -- Nausea and/or vomiting - persistent -- Extreme swelling or bruising -- Continued bleeding from incision -- Increased pain, redness, or drainage from the incision -- Difficulty swallowing or breathing -- Muscle cramping or spasms -- Numbness or tingling in hands or around lips  The clinic staff is available to answer your questions during regular business hours.  Please don't hesitate to call and ask to speak to one of the nurses if you have concerns.  Armandina Gemma, MD [facility] Office: [place]  THYROID & PARATHYROID SURGERY:  POST-OP INSTRUCTIONS  Always review your discharge instruction sheet from the facility where your surgery was performed.  A prescription for pain medication may be given to you upon discharge.  Take your pain medication as prescribed.  If narcotic pain medicine is not needed, then you may take acetaminophen (Tylenol) or ibuprofen (Advil) as needed.  Take your usually prescribed medications unless otherwise directed.  If you need a refill  on your pain medication, please contact our office during regular business hours.  Prescriptions cannot be processed by our office after 5 pm or on weekends.  Start with a light diet upon arrival home, such as soup and crackers or toast.  Be sure to drink plenty of fluids daily.  Resume your normal diet the day after  surgery.  Most patients will experience some swelling and bruising on the chest and neck area.  Ice packs will help.  Swelling and bruising can take several days to resolve.   It is common to experience some constipation after surgery.  Increasing fluid intake and taking a stool softener (Colace) will usually help or prevent this problem.  A mild laxative (Milk of Magnesia or Miralax) should be taken according to package directions if there has been no bowel movement after 48 hours.  You have steri-strips and a gauze dressing over your incision.  You may remove the gauze bandage on the second day after surgery, and you may shower at that time.  Leave your steri-strips (small skin tapes) in place directly over the incision.  These strips should remain on the skin for 5-7 days and then be removed.  You may get them wet in the shower and pat them dry.  You may resume regular (light) daily activities beginning the next day (such as daily self-care, walking, climbing stairs) gradually increasing activities as tolerated.  You may have sexual intercourse when it is comfortable.  Refrain from any heavy lifting or straining until approved by your doctor.  You may drive when you no longer are taking prescription pain medication, you can comfortably wear a seatbelt, and you can safely maneuver your car and apply brakes.  You should see your doctor in the office for a follow-up appointment approximately three weeks after your surgery.  Make sure that you call for this appointment within a day or two after you arrive home to insure a convenient appointment time.  WHEN TO CALL YOUR DOCTOR: -- Fever greater than 101.5 -- Inability to urinate -- Nausea and/or vomiting - persistent -- Extreme swelling or bruising -- Continued bleeding from incision -- Increased pain, redness, or drainage from the incision -- Difficulty swallowing or breathing -- Muscle cramping or spasms -- Numbness or tingling in hands or  around lips  The clinic staff is available to answer your questions during regular business hours.  Please don't hesitate to call and ask to speak to one of the nurses if you have concerns.  Armandina Gemma, MD [facility] Office: [phone]

## 2021-01-21 NOTE — Op Note (Signed)
OPERATIVE REPORT - PARATHYROIDECTOMY  Preoperative diagnosis: Primary hyperparathyroidism  Postop diagnosis: Same  Procedure: Left inferior minimally invasive parathyroidectomy  Surgeon:  Armandina Gemma, MD  Anesthesia: General endotracheal  Estimated blood loss: Minimal  Preparation: ChloraPrep  Indications: Patient is referred by Dr. Jacelyn Pi for surgical evaluation and management of primary hyperparathyroidism.  Patient was originally diagnosed several years ago by Dr. Thomos Lemons.  She had undergone a nuclear medicine parathyroid scan in 2018.  This had indicated a left inferior parathyroid adenoma.  Recent laboratory studies showed an elevated calcium level of 11.6 and an elevated intact PTH level of 64.  Vitamin D was normal at 33.  Patient underwent a repeat parathyroid scan on November 30, 2020.  This again confirmed a left inferior parathyroid adenoma.  Patient presents at this time to discuss surgical intervention.    Procedure: The patient was prepared in the pre-operative holding area. The patient was brought to the operating room and placed in a supine position on the operating room table. Following administration of general anesthesia, the patient was positioned and then prepped and draped in the usual strict aseptic fashion. After ascertaining that an adequate level of anesthesia been achieved, a neck incision was made with a #15 blade. Dissection was carried through subcutaneous tissues and platysma. Hemostasis was obtained with the electrocautery. Skin flaps were developed circumferentially and a Weitlander retractor was placed for exposure.  Strap muscles were incised in the midline. Strap muscles were reflected laterally exposing the thyroid lobe. With gentle blunt dissection the thyroid lobe was mobilized.  Dissection was carried through adipose tissue and an enlarged parathyroid gland was identified. It was gently mobilized. Vascular structures were divided between small  ligaclips. Care was taken to avoid the recurrent laryngeal nerve and the esophagus. The parathyroid gland was completely excised. It was submitted to pathology where frozen section confirmed parathyroid tissue consistent with adenoma.  Neck was irrigated with warm saline and good hemostasis was noted. Fibrillar was placed in the operative field. Strap muscles were approximated in the midline with interrupted 3-0 Vicryl sutures. Platysma was closed with interrupted 3-0 Vicryl sutures. Marcaine was infiltrated circumferentially. Skin was closed with a running 4-0 Monocryl subcuticular suture. Wound was washed and dried and Dermabond was applied. Patient was awakened from anesthesia and brought to the recovery room. The patient tolerated the procedure well.   Armandina Gemma, MD [facility] Office: [phone]

## 2021-01-21 NOTE — Anesthesia Preprocedure Evaluation (Addendum)
Anesthesia Evaluation  Patient identified by MRN, date of birth, ID band Patient awake    Reviewed: Allergy & Precautions, NPO status , Patient's Chart, lab work & pertinent test results  History of Anesthesia Complications Negative for: history of anesthetic complications  Airway Mallampati: II  TM Distance: >3 FB Neck ROM: Full    Dental no notable dental hx. (+) Dental Advisory Given   Pulmonary sleep apnea and Continuous Positive Airway Pressure Ventilation ,    Pulmonary exam normal        Cardiovascular hypertension, Pt. on medications Normal cardiovascular exam  IMPRESSION: 1. Coronary calcium score is 981 and this is at percentile 97 for patients of the same age, gender and ethnicity. 2. Fusiform aneurysm of the ascending thoracic aorta measuring up to 4.2 cm. Recommend annual imaging followup by CTA or MRA. This recommendation follows 2010 ACCF/AHA/AATS/ACR/ASA/SCA/SCAI/SIR/STS/SVM Guidelines for the Diagnosis and Management of Patients with Thoracic Aortic Disease. Circulation. 2010; 121: W295-A213. Aortic aneurysm NOS (ICD10-I71.9) 3. Aortic Atherosclerosis (ICD10-I70.0). 4. Small pulmonary nodules at the lung bases. Majority of these pulmonary nodules are stable and compatible with benign nodules. There is a 3 mm nodule in the left lower lobe which has been stable since 2021 but not clear if it was present in 2017. No follow-up needed if patient is low-risk. Non-contrast chest CT can be considered in 12 months if patient is high-risk. This recommendation follows the consensus statement: Guidelines for Management of Incidental Pulmonary Nodules Detected on CT Images: From the Fleischner Society 2017; Radiology 2017; 284:228-243.    Neuro/Psych PSYCHIATRIC DISORDERS Anxiety Depression negative neurological ROS     GI/Hepatic Neg liver ROS, GERD  ,  Endo/Other  negative endocrine ROS  Renal/GU Renal disease      Musculoskeletal  (+) Arthritis , Fibromyalgia -  Abdominal   Peds  Hematology negative hematology ROS (+)   Anesthesia Other Findings   Reproductive/Obstetrics                            Anesthesia Physical Anesthesia Plan  ASA: 3  Anesthesia Plan: General   Post-op Pain Management:    Induction: Intravenous  PONV Risk Score and Plan: 4 or greater and Ondansetron, Dexamethasone, Midazolam and Scopolamine patch - Pre-op  Airway Management Planned: Oral ETT  Additional Equipment:   Intra-op Plan:   Post-operative Plan: Extubation in OR  Informed Consent: I have reviewed the patients History and Physical, chart, labs and discussed the procedure including the risks, benefits and alternatives for the proposed anesthesia with the patient or authorized representative who has indicated his/her understanding and acceptance.     Dental advisory given  Plan Discussed with: Anesthesiologist and CRNA  Anesthesia Plan Comments:        Anesthesia Quick Evaluation

## 2021-01-21 NOTE — Transfer of Care (Signed)
Immediate Anesthesia Transfer of Care Note  Patient: [name]  Procedure(s) Performed: LEFT INFERIOR PARATHYROIDECTOMY (Left: Neck)  Patient Location: PACU  Anesthesia Type:General  Level of Consciousness: drowsy  Airway & Oxygen Therapy: Patient Spontanous Breathing and Patient connected to face mask oxygen  Post-op Assessment: Report given to RN and Post -op Vital signs reviewed and stable  Post vital signs: Reviewed and stable  Last Vitals:  Vitals Value Taken Time  BP    Temp    Pulse 80 01/21/21 1504  Resp 8 01/21/21 1504  SpO2 97 % 01/21/21 1504  Vitals shown include unvalidated device data.  Last Pain:  Vitals:   01/21/21 1210  TempSrc:   PainSc: 0-No pain      Patients Stated Pain Goal: 4 (54/36/06 7703)  Complications: No notable events documented.

## 2021-01-21 NOTE — Anesthesia Postprocedure Evaluation (Signed)
Anesthesia Post Note  Patient: Skylinn L Cassin  Procedure(s) Performed: LEFT INFERIOR PARATHYROIDECTOMY (Left: Neck)     Patient location during evaluation: PACU Anesthesia Type: General Level of consciousness: sedated Pain management: pain level controlled Vital Signs Assessment: post-procedure vital signs reviewed and stable Respiratory status: spontaneous breathing and respiratory function stable Cardiovascular status: stable Postop Assessment: no apparent nausea or vomiting Anesthetic complications: no   No notable events documented.  Last Vitals:  Vitals:   01/21/21 1505 01/21/21 1530  BP: (!) 157/92 (!) 156/96  Pulse: 79 80  Resp: 16 (!) 21  Temp: 36.5 C   SpO2: 100% 94%    Last Pain:  Vitals:   01/21/21 1540  TempSrc:   PainSc: 4                  Toshia Larkin DANIEL

## 2021-01-22 ENCOUNTER — Encounter (HOSPITAL_COMMUNITY): Payer: Self-pay | Admitting: Surgery

## 2021-01-24 LAB — SURGICAL PATHOLOGY

## 2021-01-25 ENCOUNTER — Encounter (HOSPITAL_COMMUNITY): Payer: Self-pay | Admitting: Surgery

## 2021-01-25 MED ORDER — MICROFIBRILLAR COLL HEMOSTAT EX PADS
MEDICATED_PAD | CUTANEOUS | Status: AC | PRN
  Administered 2021-01-21: 1 via TOPICAL

## 2021-01-25 NOTE — OR Nursing (Signed)
Addendum created

## 2021-02-02 ENCOUNTER — Encounter (HOSPITAL_COMMUNITY): Payer: Self-pay | Admitting: Surgery

## 2021-02-02 NOTE — Addendum Note (Signed)
Addendum  created 02/02/21 1412 by Duane Boston, MD   Intraprocedure Event edited, Intraprocedure Staff edited

## 2021-02-03 ENCOUNTER — Ambulatory Visit: Payer: Medicare Other | Admitting: Cardiology

## 2021-02-04 ENCOUNTER — Other Ambulatory Visit: Payer: Self-pay

## 2021-02-04 ENCOUNTER — Ambulatory Visit: Payer: Medicare Other

## 2021-02-04 DIAGNOSIS — I2510 Atherosclerotic heart disease of native coronary artery without angina pectoris: Secondary | ICD-10-CM

## 2021-02-04 DIAGNOSIS — I10 Essential (primary) hypertension: Secondary | ICD-10-CM | POA: Diagnosis not present

## 2021-02-07 DIAGNOSIS — I10 Essential (primary) hypertension: Secondary | ICD-10-CM | POA: Diagnosis not present

## 2021-02-07 DIAGNOSIS — E210 Primary hyperparathyroidism: Secondary | ICD-10-CM | POA: Diagnosis not present

## 2021-02-08 ENCOUNTER — Other Ambulatory Visit: Payer: Self-pay

## 2021-02-08 ENCOUNTER — Encounter: Payer: Self-pay | Admitting: Cardiology

## 2021-02-08 ENCOUNTER — Ambulatory Visit: Payer: Medicare Other | Admitting: Cardiology

## 2021-02-08 VITALS — BP 123/74 | HR 80 | Temp 98.4°F | Resp 16 | Ht 67.0 in | Wt 204.6 lb

## 2021-02-08 DIAGNOSIS — I700 Atherosclerosis of aorta: Secondary | ICD-10-CM | POA: Diagnosis not present

## 2021-02-08 DIAGNOSIS — I10 Essential (primary) hypertension: Secondary | ICD-10-CM

## 2021-02-08 DIAGNOSIS — E7800 Pure hypercholesterolemia, unspecified: Secondary | ICD-10-CM

## 2021-02-08 DIAGNOSIS — I2510 Atherosclerotic heart disease of native coronary artery without angina pectoris: Secondary | ICD-10-CM | POA: Diagnosis not present

## 2021-02-08 LAB — BASIC METABOLIC PANEL
BUN/Creatinine Ratio: 17 (ref 12–28)
BUN: 14 mg/dL (ref 8–27)
CO2: 23 mmol/L (ref 20–29)
Calcium: 9.6 mg/dL (ref 8.7–10.3)
Chloride: 100 mmol/L (ref 96–106)
Creatinine, Ser: 0.84 mg/dL (ref 0.57–1.00)
Glucose: 97 mg/dL (ref 65–99)
Potassium: 4.7 mmol/L (ref 3.5–5.2)
Sodium: 139 mmol/L (ref 134–144)
eGFR: 74 mL/min/{1.73_m2} (ref >59)

## 2021-02-08 MED ORDER — EZETIMIBE 10 MG PO TABS: 10.0000 mg | ORAL_TABLET | Freq: Every day | ORAL | 0 refills | Status: DC

## 2021-02-08 NOTE — Progress Notes (Signed)
Primary Physician/Referring:  Deland Pretty, MD  Patient ID: Alexandra Gibson, female    DOB: 1950-10-15, 70 y.o.   MRN: [id number]  Chief Complaint  Patient presents with   Hypertension   CORONARY ATHEROSCLEROSIS    6 WEEKS   HPI:    Alexandra Gibson  is a 70 y.o. African-American female patient with hypertension, hyperlipidemia, hyperglycemia, primary hyperparathyroidism, aortic atherosclerosis and coronary calcification noted on the CT scan of the abdomen performed on 11/10/2020 and is now referred to me for cardiac restratification.  Prior to COVID-19 pandemic, she was walking at least 3 to 4 months on a regular basis but has reduced her physical activity.  She has no specific complaints of dyspnea or chest pain.  She has been taking all her medications as prescribed.  She underwent echocardiogram and a treadmill stress test and presents for follow-up.  No new symptomatology.  Past Medical History:  Diagnosis Date   Anxiety    Anxiety and depression    Arthritis    Depression    Fibromyalgia    GERD (gastroesophageal reflux disease)    History of kidney stones    History of urinary tract infection    Hyperlipidemia    Hypertension    Kidney stone    10-2016; " i had a kidney stone that wouldnt move so they had to go in to take it out "   Numbness    right hand/comes and goes    Osteoarthritis of knee    bil-gets injections   Parathyroid tumor    Pre-diabetes    Urinary frequency    Wears glasses    Past Surgical History:  Procedure Laterality Date   APPENDECTOMY     CYSTOSCOPY WITH STENT PLACEMENT Left 10/27/2015   Procedure: CYSTOSCOPY WITH STENT PLACEMENT;  Surgeon: Nickie Retort, MD;  Location: [facility];  Service: Urology;  Laterality: Left;   JOINT REPLACEMENT     KNEE ARTHROSCOPY     left   LAPAROSCOPIC ENDOMETRIOSIS FULGURATION  early 1970s   PARATHYROIDECTOMY Left 01/21/2021   Procedure: LEFT INFERIOR PARATHYROIDECTOMY;  Surgeon: Armandina Gemma, MD;  Location:  [facility];  Service: General;  Laterality: Left;   TOTAL HIP ARTHROPLASTY Left 08/04/2015   Procedure: TOTAL LEFT  HIP ARTHROPLASTY ANTERIOR APPROACH;  Surgeon: Gaynelle Arabian, MD;  Location: [facility];  Service: Orthopedics;  Laterality: Left;   TOTAL HIP ARTHROPLASTY Right 11/07/2017   Procedure: RIGHT TOTAL HIP ARTHROPLASTY ANTERIOR APPROACH;  Surgeon: Gaynelle Arabian, MD;  Location: [facility];  Service: Orthopedics;  Laterality: Right;   TOTAL KNEE ARTHROPLASTY Left 06/29/2014   Procedure: TOTAL KNEE ARTHROPLASTY LEFT ;  Surgeon: Gearlean Alf, MD;  Location: [facility];  Service: Orthopedics;  Laterality: Left;   TOTAL KNEE ARTHROPLASTY Right 09/18/2016   Procedure: RIGHT TOTAL KNEE ARTHROPLASTY;  Surgeon: Gaynelle Arabian, MD;  Location: [facility];  Service: Orthopedics;  Laterality: Right;  Adductor Block   URETEROSCOPY WITH HOLMIUM LASER LITHOTRIPSY Left 10/27/2015   Procedure: URETEROSCOPY WITH HOLMIUM LASER LITHOTRIPSY;  Surgeon: Nickie Retort, MD;  Location: [facility];  Service: Urology;  Laterality: Left;   Family History  Problem Relation Age of Onset   Hypertension Mother    Colon cancer Neg Hx    Kidney disease Neg Hx    Bladder Cancer Neg Hx    Esophageal cancer Neg Hx    Pancreatic cancer Neg Hx    Rectal cancer Neg Hx    Stomach cancer Neg Hx  Social History   Tobacco Use   Smoking status: Never   Smokeless tobacco: Never  Substance Use Topics   Alcohol use: No   Marital Status: Married  ROS  Review of Systems  Cardiovascular:  Negative for chest pain, dyspnea on exertion and leg swelling.  Respiratory:  Positive for snoring (sleep apnea on CPAP).   Musculoskeletal:  Positive for arthritis.  Gastrointestinal:  Negative for melena.  Objective  Blood pressure 123/74, pulse 80, temperature 98.4 F (36.9 C), temperature source Temporal, resp. rate 16, height '5\' 7"'  (1.702 m), weight 204 lb 9.6 oz (92.8 kg), SpO2 95 %. Body mass index is 32.04 kg/m.  Vitals with BMI 02/08/2021  01/21/2021 01/21/2021  Height '5\' 7"'  - -  Weight 204 lbs 10 oz - -  BMI 97.58 - -  Systolic 832 549 826  Diastolic 74 98 96  Pulse 80 97 95     Physical Exam  Constitutional: No distress.  Eyes: Conjunctivae are normal.  Neck: No JVD present.  Cardiovascular: Regular rhythm, normal heart sounds, intact distal pulses and normal pulses. Exam reveals no gallop.  No murmur heard. Pulmonary/Chest: Effort normal and breath sounds normal. She exhibits no tenderness.  Abdominal: Soft. Bowel sounds are normal.  Musculoskeletal:        General: No edema. Normal range of motion.     Cervical back: Neck supple.  Neurological: She is alert and oriented to person, place, and time.  Skin: Skin is warm.    Laboratory examination:   Recent Labs    07/28/20 0924 01/13/21 0946 02/07/21 1202  NA  --  140 139  K  --  4.5 4.7  CL  --  108 100  CO2  --  26 23  GLUCOSE  --  106* 97  BUN  --  23 14  CREATININE 0.80 0.79 0.84  CALCIUM  --  10.3 9.6  GFRNONAA  --  >60  --    estimated creatinine clearance is 71.9 mL/min (by C-G formula based on SCr of 0.84 mg/dL).  CMP Latest Ref Rng & Units 02/07/2021 01/13/2021 08/16/2020  Glucose 65 - 99 mg/dL 97 106(H) -  BUN 8 - 27 mg/dL 14 23 -  Creatinine 0.57 - 1.00 mg/dL 0.84 0.79 -  Sodium 134 - 144 mmol/L 139 140 -  Potassium 3.5 - 5.2 mmol/L 4.7 4.5 -  Chloride 96 - 106 mmol/L 100 108 -  CO2 20 - 29 mmol/L 23 26 -  Calcium 8.7 - 10.3 mg/dL 9.6 10.3 -  Total Protein 6.0 - 8.3 g/dL - - 7.8  Total Bilirubin 0.2 - 1.2 mg/dL - - 0.6  Alkaline Phos 39 - 117 U/L - - 103  AST 0 - 37 U/L - - 13  ALT 0 - 35 U/L - - 14   CBC Latest Ref Rng & Units 01/13/2021 08/25/2018 11/08/2017  WBC 4.0 - 10.5 K/uL 8.8 9.2 17.8(H)  Hemoglobin 12.0 - 15.0 g/dL 14.3 14.7 12.4  Hematocrit 36.0 - 46.0 % 46.5(H) 47.7(H) 39.9  Platelets 150 - 400 K/uL 272 247 226    External labs:   Labs 11/08/2020:  Serum glucose: 4 mg, BUN 13, creatinine 0.7, EGFR 106 mL, sodium 145,  potassium 4.4, normal LFTs.  Calcium 10.0.  Vitamin D 55.  A1c 6.4%.  PTH 47, normal.  Hb 13.5/HCT 42.1, platelets 250.  Total cholesterol 181, triglycerides 67, HDL 54, LDL 115.  Medications and allergies   Allergies  Allergen Reactions   Lipitor [  Atorvastatin] Other (See Comments)    REACTION: leg cramp     Current Outpatient Medications on File Prior to Visit  Medication Sig Dispense Refill   amLODipine (NORVASC) 10 MG tablet Take 1 tablet (10 mg total) by mouth daily. 90 tablet 0   celecoxib (CELEBREX) 200 MG capsule Take 200 mg by mouth every Monday, Wednesday, and Friday.     cetirizine (ZYRTEC) 10 MG tablet Take 10 mg by mouth at bedtime.     famotidine (PEPCID) 40 MG tablet Take 40 mg by mouth at bedtime.     irbesartan (AVAPRO) 300 MG tablet Take 300 mg by mouth every morning.     pravastatin (PRAVACHOL) 40 MG tablet Take 40 mg by mouth every evening.     spironolactone (ALDACTONE) 25 MG tablet Take 1 tablet (25 mg total) by mouth every morning. 30 tablet 2   tetrahydrozoline-zinc (EYE DROPS ALLERGY RELIEF) 0.05-0.25 % ophthalmic solution Place 2 drops into both eyes daily as needed (allergies).     Current Facility-Administered Medications on File Prior to Visit  Medication Dose Route Frequency Provider Last Rate Last Admin   microfibrllar collagen (AVITENE) pad    PRN Armandina Gemma, MD   1 each at 01/21/21 1445    Radiology:   CT of the abdomen with contrast 07/28/2020: 1. Hepatic steatosis with area of fatty sparing about the anterior medial segment of the LEFT hepatic lobe. 2. Lobulated pulmonary nodule in the LEFT lower lobe measures 11 x 7 mm and is unchanged dating back to 2017. Compatible with benign finding. 3. Coronary artery disease with calcification of LEFT coronary circulation. 4. Small hiatal hernia. 5. Scoliotic curvature of the spine with degenerative changes. Curvature may be slightly increased particularly in the thoracic spine. This could be due  to patient positioning as well. 6. Aortic atherosclerosis.  Cardiac Studies:   Echocardiogram 02/04/2021: Left ventricle cavity is normal in size and wall thickness. Normal global wall motion. Normal LV systolic function with EF 60%. Doppler evidence of grade I (impaired) diastolic dysfunction, normal LAP. Calculated EF 60%. Left atrial cavity is mildly dilated. Trileaflet aortic valve.  Mild (Grade I) aortic regurgitation. Mild (Grade I) mitral regurgitation. Mild tricuspid regurgitation. Estimated pulmonary artery systolic pressure 24 mmHg.  Exercise treadmill stress test 02/04/2021: Exercise treadmill stress test performed using Bruce protocol.  Patient reached 4.7 METS, and 77% of age predicted maximum heart rate.  Exercise capacity was low.  No chest pain reported.  Normal heart rate and hemodynamic response. Stress EKG revealed no ischemic changes. Inconclusive stress test due to submaximal effort.  EKG:   EKG 12/15/2020: Normal sinus rhythm at rate of 61 bpm, left atrial enlargement, left axis deviation, left intrafascicular block.  Incomplete right bundle branch block.      Assessment     ICD-10-CM   1. Aortic atherosclerosis (HCC)  I70.0     2. Primary hypertension  I10     3. HYPERCHOLESTEROLEMIA  E78.00 ezetimibe (ZETIA) 10 MG tablet     Meds ordered this encounter  Medications   ezetimibe (ZETIA) 10 MG tablet    Sig: Take 1 tablet (10 mg total) by mouth daily after supper.    Dispense:  90 tablet    Refill:  0   No orders of the defined types were placed in this encounter.  Recommendations:   NATASH BERMAN is a 70 y.o. African-American female patient with hypertension, hyperlipidemia, hyperglycemia, primary hyperparathyroidism, aortic atherosclerosis and coronary calcification noted on the CT scan  of the abdomen performed on 11/10/2020 and is now referred to me for cardiac risk stratification.  I reviewed the results of the echocardiogram which is essentially  normal except for mild diastolic dysfunction mild aortic regurgitation which is of no clinical significance with regard to valvular heart disease.  Her dyspnea may be related to diastolic dysfunction.  I also reviewed her stress test, markedly reduced exercise tolerance.  Patient fairly active previously until COVID-19 and is physically reduced her activity to being extremely sedentary.  She remains asymptomatic without chest pain or dyspnea.  Since increasing the dose of amlodipine from 5 mg to 10 mg and adding spironolactone, blood pressure is well controlled.  Although treadmill stress test was inconclusive, in the absence of any symptoms of chest pain or dyspnea I recommended primary prevention with risk factor modification.  Patient felt reassured.  She will call us if she has any dyspnea, chest pain or palpitations.  Weight loss also discussed.  With regard to hyperlipidemia, LDL is >100, added Zetia to the present regimen with pravastatin as she has been intolerant to Lipitor and Crestor in the past.  Goal LDL <100.  She will follow-up with her PCP for further management of hypertension and hyperlipidemia and prescription refills.   Adrian Prows, MD, [facility] 02/10/2021, 10:25 PM Office: [phone]

## 2021-02-08 NOTE — Patient Instructions (Signed)
I am starting you on a new medication called ezetimibe 10 mg to be taken after dinner every day.  You will continue taking pravastatin daily.  When you see Ms. Alexandra Gibson, please ask her to check your cholesterol in 2 months and if cholesterol is looking well and if your bad cholesterol that is LDL is <70- 100, she can continue the same medication.  She can do the refills.  I will see her back as needed.

## 2021-02-10 DIAGNOSIS — R7309 Other abnormal glucose: Secondary | ICD-10-CM | POA: Diagnosis not present

## 2021-02-10 DIAGNOSIS — M2550 Pain in unspecified joint: Secondary | ICD-10-CM | POA: Diagnosis not present

## 2021-02-10 DIAGNOSIS — K219 Gastro-esophageal reflux disease without esophagitis: Secondary | ICD-10-CM | POA: Diagnosis not present

## 2021-02-10 DIAGNOSIS — I10 Essential (primary) hypertension: Secondary | ICD-10-CM | POA: Diagnosis not present

## 2021-02-10 DIAGNOSIS — I2510 Atherosclerotic heart disease of native coronary artery without angina pectoris: Secondary | ICD-10-CM | POA: Diagnosis not present

## 2021-02-10 DIAGNOSIS — K760 Fatty (change of) liver, not elsewhere classified: Secondary | ICD-10-CM | POA: Diagnosis not present

## 2021-02-10 DIAGNOSIS — E892 Postprocedural hypoparathyroidism: Secondary | ICD-10-CM | POA: Diagnosis not present

## 2021-02-10 DIAGNOSIS — E785 Hyperlipidemia, unspecified: Secondary | ICD-10-CM | POA: Diagnosis not present

## 2021-02-17 NOTE — Progress Notes (Signed)
HPI F never smoker followed for OSA, complicated by  HBP, GERD, Osteoarthritis, Restless Legs, Hypercholesterolemia, Hypercalcemia, Kidney stones, Depression, Hydronephrosis L, Allergic Rhinitis HST 01/05/19 AHI 37.4/ hr, desaturation to 67%, body weight 204 lbs  -----------------------------------------------------------------------------------------   02/19/20- 69 yoF never smoker followed for OSA, complicated by  HBP, GERD, Osteoarthritis, Restless Legs, Hypercholesterolemia, Hypercalcemia, Kidney stones, Depression, Hydronephrosis L, Allergic Rhinitis HST 01/05/19 AHI 37.4/ hr, desaturation to 67%, body weight 204 lbs CPAP auto 5-15/ [employer] Download compliance 97%, AHI 4.8/ hr Body weight today 209 lbs ------wakes up at times to readjust Cpap mask due to hearing air blowing out Discussed CPAP mask, comfort. Can send for mask fitting.  Covid vax- 2 Phizer  02/18/21- 31 yoF never smoker followed for OSA, complicated by  HBP, GERD, Osteoarthritis, Restless Legs, Hypercholesterolemia, Aortic atherosclerosis, Hypercalcemia/ Hyperparathyroidism, Kidney stones, Depression, Hydronephrosis L, Allergic Rhinitis CPAP auto 5-15/ [employer] Download-compliance 97%, AHI 3.8/ hr Body weight today-204 lbs [place] for parathyroidectomy in June Download reviewed.   ROS-see HPI   + = positive Constitutional:    weight loss, night sweats, fevers, chills, fatigue, lassitude. HEENT:    headaches, difficulty swallowing, tooth/dental problems, sore throat,       sneezing, itching, ear ache, nasal congestion, post nasal drip, snoring CV:    chest pain, orthopnea, PND, swelling in lower extremities, anasarca,                                   dizziness, palpitations Resp:   shortness of breath with exertion or at rest.                productive cough,   non-productive cough, coughing up of blood.              change in color of mucus.  wheezing.   Skin:    rash or lesions. GI:  + heartburn, indigestion,  abdominal pain, nausea, vomiting, diarrhea,                 change in bowel habits, loss of appetite GU: dysuria, change in color of urine, no urgency or frequency.   flank pain. MS:  + joint pain, stiffness, decreased range of motion, back pain. Neuro-     nothing unusual Psych:  change in mood or affect.  depression or anxiety.   memory loss.  OBJ- Physical Exam General- Alert, Oriented, Affect-appropriate, Distress- none acute, + obese Skin- rash-none, lesions- none, excoriation- none Lymphadenopathy- none Head- atraumatic            Eyes- Gross vision intact, PERRLA, conjunctivae and secretions clear            Ears- Hearing, canals-normal            Nose- Clear, no-Septal dev, mucus, polyps, erosion, perforation             Throat- Mallampati II-III , mucosa clear , drainage- none, tonsils- atrophic Neck- + surgical scar , trachea midline, no stridor , thyroid nl, carotid no bruit Chest - symmetrical excursion , unlabored           Heart/CV- RRR , no murmur , no gallop  , no rub, nl s1 s2                           - JVD- none , edema- none, stasis changes- none, varices- none  Lung- clear to P&A, wheeze- none, cough- none , dullness-none, rub- none           Chest wall-  Abd-  Br/ Gen/ Rectal- Not done, not indicated Extrem- cyanosis- none, clubbing, none, atrophy- none, strength- nl, + knee surgery scar Neuro- grossly intact to observation

## 2021-02-18 ENCOUNTER — Other Ambulatory Visit: Payer: Self-pay

## 2021-02-18 ENCOUNTER — Ambulatory Visit (INDEPENDENT_AMBULATORY_CARE_PROVIDER_SITE_OTHER): Payer: Federal, State, Local not specified - PPO | Admitting: Internal Medicine

## 2021-02-18 ENCOUNTER — Encounter: Payer: Self-pay | Admitting: Internal Medicine

## 2021-02-18 DIAGNOSIS — G4733 Obstructive sleep apnea (adult) (pediatric): Secondary | ICD-10-CM | POA: Diagnosis not present

## 2021-02-18 DIAGNOSIS — E210 Primary hyperparathyroidism: Secondary | ICD-10-CM | POA: Diagnosis not present

## 2021-02-18 DIAGNOSIS — I2510 Atherosclerotic heart disease of native coronary artery without angina pectoris: Secondary | ICD-10-CM

## 2021-02-18 NOTE — Patient Instructions (Signed)
We can continue CPAP auto 5-15 ° °Please call if we can help °

## 2021-03-27 ENCOUNTER — Encounter: Payer: Self-pay | Admitting: Gastroenterology

## 2021-04-15 DIAGNOSIS — I2510 Atherosclerotic heart disease of native coronary artery without angina pectoris: Secondary | ICD-10-CM | POA: Diagnosis not present

## 2021-04-15 DIAGNOSIS — K219 Gastro-esophageal reflux disease without esophagitis: Secondary | ICD-10-CM | POA: Diagnosis not present

## 2021-04-15 DIAGNOSIS — E785 Hyperlipidemia, unspecified: Secondary | ICD-10-CM | POA: Diagnosis not present

## 2021-04-15 DIAGNOSIS — I10 Essential (primary) hypertension: Secondary | ICD-10-CM | POA: Diagnosis not present

## 2021-04-15 DIAGNOSIS — R7303 Prediabetes: Secondary | ICD-10-CM | POA: Diagnosis not present

## 2021-04-26 DIAGNOSIS — K080 Exfoliation of teeth due to systemic causes: Secondary | ICD-10-CM | POA: Diagnosis not present

## 2021-05-12 ENCOUNTER — Other Ambulatory Visit: Payer: Self-pay | Admitting: Cardiology

## 2021-05-12 DIAGNOSIS — E7800 Pure hypercholesterolemia, unspecified: Secondary | ICD-10-CM

## 2021-05-13 DIAGNOSIS — Z23 Encounter for immunization: Secondary | ICD-10-CM | POA: Diagnosis not present

## 2021-06-14 DIAGNOSIS — Z96652 Presence of left artificial knee joint: Secondary | ICD-10-CM | POA: Diagnosis not present

## 2021-06-14 DIAGNOSIS — M545 Low back pain, unspecified: Secondary | ICD-10-CM | POA: Diagnosis not present

## 2021-06-14 DIAGNOSIS — M25561 Pain in right knee: Secondary | ICD-10-CM | POA: Diagnosis not present

## 2021-06-14 DIAGNOSIS — Z96643 Presence of artificial hip joint, bilateral: Secondary | ICD-10-CM | POA: Diagnosis not present

## 2021-06-19 NOTE — Assessment & Plan Note (Signed)
No apparent respiratory concerns with recent surgery

## 2021-06-19 NOTE — Assessment & Plan Note (Addendum)
Benefits from CPAP Plan- continue auto 5-15, encouraged to keep weight down

## 2021-07-21 DIAGNOSIS — R200 Anesthesia of skin: Secondary | ICD-10-CM | POA: Diagnosis not present

## 2021-07-21 DIAGNOSIS — M5416 Radiculopathy, lumbar region: Secondary | ICD-10-CM | POA: Diagnosis not present

## 2021-07-27 DIAGNOSIS — R7309 Other abnormal glucose: Secondary | ICD-10-CM | POA: Diagnosis not present

## 2021-07-27 DIAGNOSIS — E210 Primary hyperparathyroidism: Secondary | ICD-10-CM | POA: Diagnosis not present

## 2021-07-27 DIAGNOSIS — I129 Hypertensive chronic kidney disease with stage 1 through stage 4 chronic kidney disease, or unspecified chronic kidney disease: Secondary | ICD-10-CM | POA: Diagnosis not present

## 2021-07-27 DIAGNOSIS — E559 Vitamin D deficiency, unspecified: Secondary | ICD-10-CM | POA: Diagnosis not present

## 2021-07-27 DIAGNOSIS — E892 Postprocedural hypoparathyroidism: Secondary | ICD-10-CM | POA: Diagnosis not present

## 2021-07-28 DIAGNOSIS — E785 Hyperlipidemia, unspecified: Secondary | ICD-10-CM | POA: Diagnosis not present

## 2021-07-28 DIAGNOSIS — K219 Gastro-esophageal reflux disease without esophagitis: Secondary | ICD-10-CM | POA: Diagnosis not present

## 2021-07-28 DIAGNOSIS — I129 Hypertensive chronic kidney disease with stage 1 through stage 4 chronic kidney disease, or unspecified chronic kidney disease: Secondary | ICD-10-CM | POA: Diagnosis not present

## 2021-07-28 DIAGNOSIS — E1165 Type 2 diabetes mellitus with hyperglycemia: Secondary | ICD-10-CM | POA: Diagnosis not present

## 2021-08-03 DIAGNOSIS — G5601 Carpal tunnel syndrome, right upper limb: Secondary | ICD-10-CM | POA: Diagnosis not present

## 2021-08-05 DIAGNOSIS — G4733 Obstructive sleep apnea (adult) (pediatric): Secondary | ICD-10-CM | POA: Diagnosis not present

## 2021-08-10 DIAGNOSIS — Z6833 Body mass index (BMI) 33.0-33.9, adult: Secondary | ICD-10-CM | POA: Diagnosis not present

## 2021-08-10 DIAGNOSIS — Z01419 Encounter for gynecological examination (general) (routine) without abnormal findings: Secondary | ICD-10-CM | POA: Diagnosis not present

## 2021-08-10 DIAGNOSIS — Z1389 Encounter for screening for other disorder: Secondary | ICD-10-CM | POA: Diagnosis not present

## 2021-08-10 DIAGNOSIS — Z1231 Encounter for screening mammogram for malignant neoplasm of breast: Secondary | ICD-10-CM | POA: Diagnosis not present

## 2021-08-10 DIAGNOSIS — Z130 Encounter for screening for diseases of the blood and blood-forming organs and certain disorders involving the immune mechanism: Secondary | ICD-10-CM | POA: Diagnosis not present

## 2021-08-13 ENCOUNTER — Other Ambulatory Visit: Payer: Self-pay | Admitting: Cardiology

## 2021-08-13 DIAGNOSIS — E7800 Pure hypercholesterolemia, unspecified: Secondary | ICD-10-CM

## 2021-08-18 DIAGNOSIS — M5451 Vertebrogenic low back pain: Secondary | ICD-10-CM | POA: Diagnosis not present

## 2021-08-22 DIAGNOSIS — M5451 Vertebrogenic low back pain: Secondary | ICD-10-CM | POA: Diagnosis not present

## 2021-08-24 DIAGNOSIS — M5451 Vertebrogenic low back pain: Secondary | ICD-10-CM | POA: Diagnosis not present

## 2021-08-29 DIAGNOSIS — M5451 Vertebrogenic low back pain: Secondary | ICD-10-CM | POA: Diagnosis not present

## 2021-08-31 DIAGNOSIS — M5451 Vertebrogenic low back pain: Secondary | ICD-10-CM | POA: Diagnosis not present

## 2021-09-05 DIAGNOSIS — M5451 Vertebrogenic low back pain: Secondary | ICD-10-CM | POA: Diagnosis not present

## 2021-09-07 DIAGNOSIS — M5451 Vertebrogenic low back pain: Secondary | ICD-10-CM | POA: Diagnosis not present

## 2021-09-12 DIAGNOSIS — M5451 Vertebrogenic low back pain: Secondary | ICD-10-CM | POA: Diagnosis not present

## 2021-09-13 DIAGNOSIS — M5416 Radiculopathy, lumbar region: Secondary | ICD-10-CM | POA: Diagnosis not present

## 2021-09-14 DIAGNOSIS — G5601 Carpal tunnel syndrome, right upper limb: Secondary | ICD-10-CM | POA: Diagnosis not present

## 2021-09-14 DIAGNOSIS — M67442 Ganglion, left hand: Secondary | ICD-10-CM | POA: Diagnosis not present

## 2021-09-14 DIAGNOSIS — M13831 Other specified arthritis, right wrist: Secondary | ICD-10-CM | POA: Diagnosis not present

## 2021-09-14 DIAGNOSIS — M79645 Pain in left finger(s): Secondary | ICD-10-CM | POA: Diagnosis not present

## 2021-09-14 DIAGNOSIS — M13841 Other specified arthritis, right hand: Secondary | ICD-10-CM | POA: Diagnosis not present

## 2021-09-14 DIAGNOSIS — M79641 Pain in right hand: Secondary | ICD-10-CM | POA: Diagnosis not present

## 2021-09-16 ENCOUNTER — Encounter: Payer: Self-pay | Admitting: Gastroenterology

## 2021-09-16 ENCOUNTER — Ambulatory Visit (INDEPENDENT_AMBULATORY_CARE_PROVIDER_SITE_OTHER): Payer: Federal, State, Local not specified - PPO | Admitting: Gastroenterology

## 2021-09-16 VITALS — BP 124/70 | HR 72 | Ht 67.0 in | Wt 210.6 lb

## 2021-09-16 DIAGNOSIS — K760 Fatty (change of) liver, not elsewhere classified: Secondary | ICD-10-CM

## 2021-09-16 DIAGNOSIS — K219 Gastro-esophageal reflux disease without esophagitis: Secondary | ICD-10-CM

## 2021-09-16 DIAGNOSIS — Z1211 Encounter for screening for malignant neoplasm of colon: Secondary | ICD-10-CM

## 2021-09-16 DIAGNOSIS — R7989 Other specified abnormal findings of blood chemistry: Secondary | ICD-10-CM

## 2021-09-16 NOTE — Progress Notes (Signed)
[place] VISIT   Primary Care Provider Deland Pretty, [address] [place] [address] [phone]   Patient Profile: Alexandra Gibson is a 71 y.o. female with a pmh significant for anxiety/MDD, fibromyalgia, nephrolithiasis, hypertension, hyperlipidemia, prediabetes, osteoarthritis, GERD, fatty liver (imaging based).  The patient presents to the [facility] for an evaluation and management of problem(s) noted below:  Problem List 1. Fatty liver   2. Elevated LFTs   3. Colon cancer screening   4. Gastroesophageal reflux disease, unspecified whether esophagitis present     History of Present Illness Please see prior notes for full details of HPI.  Interval History The patient returns for follow-up.  She has been doing well overall.  She had LFTs followed up by her PCP office and they have remained normal.  She is planning to see her PCP next month for her yearly and is likely going to have labs done.  No other major issues have occurred.  Weight has overall been stable.  She is due for colon cancer screening based on her last colonoscopy being in 2013 with hyperplastic polyps found.  Her bowel movements are regular.  She denies any blood in her stools.  GI Review of Systems Positive as above Negative for dysphagia, odynophagia, nausea, vomiting, pain, alteration of bowel habits, melena, hematochezia   Review of Systems General: Denies fevers/chills/weight loss unintentionally Cardiovascular: Denies chest pain Pulmonary: Denies shortness of breath Gastroenterological: See HPI Genitourinary: Denies darkened urine Hematological: Denies easy bruising/bleeding Dermatological: Denies jaundice Psychological: Mood is stable   Medications Current Outpatient Medications  Medication Sig Dispense Refill   amLODipine (NORVASC) 5 MG tablet Take 5 mg by mouth daily.     celecoxib (CELEBREX) 100 MG capsule Take 100 mg  by mouth daily.     cetirizine (ZYRTEC) 10 MG tablet Take 10 mg by mouth at bedtime.     ezetimibe (ZETIA) 10 MG tablet TAKE 1 TABLET BY MOUTH EVERY DAY AFTER SUPPER 90 tablet 0   irbesartan (AVAPRO) 300 MG tablet Take 300 mg by mouth every morning.     omeprazole (PRILOSEC) 20 MG capsule Take 20 mg by mouth every morning.     pravastatin (PRAVACHOL) 40 MG tablet Take 40 mg by mouth every evening.     spironolactone (ALDACTONE) 25 MG tablet Take 1 tablet (25 mg total) by mouth every morning. 30 tablet 2   tetrahydrozoline-zinc (EYE DROPS ALLERGY RELIEF) 0.05-0.25 % ophthalmic solution Place 2 drops into both eyes daily as needed (allergies).     No current facility-administered medications for this visit.   Facility-Administered Medications Ordered in Other Visits  Medication Dose Route Frequency Provider Last Rate Last Admin   microfibrllar collagen (AVITENE) pad    PRN Armandina Gemma, MD   1 each at 01/21/21 1445    Allergies Allergies  Allergen Reactions   Lipitor [Atorvastatin] Other (See Comments)    REACTION: leg cramp    Histories Past Medical History:  Diagnosis Date   Anxiety    Anxiety and depression    Arthritis    Depression    Fibromyalgia    GERD (gastroesophageal reflux disease)    History of kidney stones    History of urinary tract infection    Hyperlipidemia    Hypertension    Kidney stone    10-2016; " i had a kidney stone that wouldnt move so they had to go in to take it out "   Numbness  right hand/comes and goes    Osteoarthritis of knee    bil-gets injections   Parathyroid tumor    Pre-diabetes    Urinary frequency    Wears glasses    Past Surgical History:  Procedure Laterality Date   APPENDECTOMY     CYSTOSCOPY WITH STENT PLACEMENT Left 10/27/2015   Procedure: CYSTOSCOPY WITH STENT PLACEMENT;  Surgeon: Nickie Retort, MD;  Location: [facility];  Service: Urology;  Laterality: Left;   LAPAROSCOPIC ENDOMETRIOSIS FULGURATION  early 1970s    PARATHYROIDECTOMY Left 01/21/2021   Procedure: LEFT INFERIOR PARATHYROIDECTOMY;  Surgeon: Armandina Gemma, MD;  Location: [facility];  Service: General;  Laterality: Left;   TOTAL HIP ARTHROPLASTY Left 08/04/2015   Procedure: TOTAL LEFT  HIP ARTHROPLASTY ANTERIOR APPROACH;  Surgeon: Gaynelle Arabian, MD;  Location: [facility];  Service: Orthopedics;  Laterality: Left;   TOTAL HIP ARTHROPLASTY Right 11/07/2017   Procedure: RIGHT TOTAL HIP ARTHROPLASTY ANTERIOR APPROACH;  Surgeon: Gaynelle Arabian, MD;  Location: [facility];  Service: Orthopedics;  Laterality: Right;   TOTAL KNEE ARTHROPLASTY Left 06/29/2014   Procedure: TOTAL KNEE ARTHROPLASTY LEFT ;  Surgeon: Gearlean Alf, MD;  Location: [facility];  Service: Orthopedics;  Laterality: Left;   TOTAL KNEE ARTHROPLASTY Right 09/18/2016   Procedure: RIGHT TOTAL KNEE ARTHROPLASTY;  Surgeon: Gaynelle Arabian, MD;  Location: [facility];  Service: Orthopedics;  Laterality: Right;  Adductor Block   URETEROSCOPY WITH HOLMIUM LASER LITHOTRIPSY Left 10/27/2015   Procedure: URETEROSCOPY WITH HOLMIUM LASER LITHOTRIPSY;  Surgeon: Nickie Retort, MD;  Location: [facility];  Service: Urology;  Laterality: Left;   Social History   Socioeconomic History   Marital status: Married    Spouse name: Not on file   Number of children: 2   Years of education: Not on file   Highest education level: Not on file  Occupational History   Not on file  Tobacco Use   Smoking status: Never   Smokeless tobacco: Never  Vaping Use   Vaping Use: Never used  Substance and Sexual Activity   Alcohol use: No   Drug use: No   Sexual activity: Not on file  Other Topics Concern   Not on file  Social History Narrative   Not on file   Social Determinants of Health   Financial Resource Strain: Not on file  Food Insecurity: Not on file  Transportation Needs: Not on file  Physical Activity: Not on file  Stress: Not on file  Social Connections: Not on file  Intimate Partner Violence: Not on file    Family History  Problem Relation Age of Onset   Hypertension Mother    Colon cancer Neg Hx    Kidney disease Neg Hx    Bladder Cancer Neg Hx    Esophageal cancer Neg Hx    Pancreatic cancer Neg Hx    Rectal cancer Neg Hx    Stomach cancer Neg Hx    Inflammatory bowel disease Neg Hx    Liver disease Neg Hx    I have reviewed her medical, social, and family history in detail and updated the electronic medical record as necessary.    PHYSICAL EXAMINATION  BP 124/70    Pulse 72    Ht _0  (1.702 m)    Wt 210 lb 9.6 oz (95.5 kg)    BMI 32.98 kg/m  Wt Readings from Last 3 Encounters:  09/16/21 210 lb 9.6 oz (95.5 kg)  02/18/21 204 lb 9.6 oz (92.8 kg)  02/08/21 204  lb 9.6 oz (92.8 kg)  GEN: NAD, appears stated age, doesn't appear chronically ill PSYCH: Cooperative, without pressured speech EYE: Conjunctivae pink, sclerae anicteric ENT: MMM CV: Nontachycardic RESP: No audible wheezing GI: NABS, soft, NT/ND, without rebound or guarding MSK/EXT: No lower extremity edema SKIN: No jaundice NEURO:  Alert & Oriented x 3, no focal deficits   REVIEW OF DATA  I reviewed the following data at the time of this encounter:  GI Procedures and Studies  Today we re-reviewed her 2013 colonoscopy 3 polyps removed. Mild diverticulosis in the sigmoid colon. Otherwise normal exam.  Laboratory Studies  Outside laboratories June 2022 labs AST/ALT 9/14 Alkaline phosphatase 108 T. bili 0.19 March 2021 labs AST/ALT 11/21 Alk phos 84 T. bili 0.6  Imaging Studies  December 2021 CT abdomen with contrast IMPRESSION: 1. Hepatic steatosis with area of fatty sparing about the anterior medial segment of the LEFT hepatic lobe. 2. Lobulated pulmonary nodule in the LEFT lower lobe measures 11 x 7 mm and is unchanged dating back to 2017. Compatible with benign finding. 3. Coronary artery disease with calcification of LEFT coronary circulation. 4. Small hiatal hernia. 5. Scoliotic curvature  of the spine with degenerative changes. Curvature may be slightly increased particularly in the thoracic spine. This could be due to patient positioning as well. 6. Aortic atherosclerosis.   ASSESSMENT  Ms. Barsanti is a 71 y.o. female with a pmh significant for anxiety/MDD, fibromyalgia, nephrolithiasis, hypertension, hyperlipidemia, prediabetes, osteoarthritis, GERD, fatty liver (imaging based).  The patient is seen today for evaluation and management of:  1. Fatty liver   2. Elevated LFTs   3. Colon cancer screening   4. Gastroesophageal reflux disease, unspecified whether esophagitis present    The patient is clinically and hemodynamically stable at this time.  She had previously had abnormal LFTs and imaging that ended up showing evidence of fatty liver.  Thankfully her LFTs have been normal.  I recommend that we repeat these at least 2-3 times per year and that can be done through our office or through her PCP office.  She is planning to see her PCP next month so a hepatic function panel and INR should be obtained at that time just to ensure there is no evidence of any progressive disease.  If LFTs change we will consider the role of repeat imaging.  Hopefully that will not be the case.  The patient has longstanding GERD symptoms and has never had an upper endoscopy and she would benefit from an evaluation to ensure she does not have any evidence of Barrett's esophagus.  We will consider this in the near future as a diagnostic endoscopy.  She is due for colon cancer screening this year and we will move forward with scheduling that at her convenience.  If we can do her upper endoscopy at the same time that would be ideal.  She will continue on her current PPI dosing since it controls her GERD symptoms well.    The risks and benefits of endoscopic evaluation were discussed with the patient; these include but are not limited to the risk of perforation, infection, bleeding, missed lesions, lack of  diagnosis, severe illness requiring hospitalization, as well as anesthesia and sedation related illnesses.  The patient and/or family is agreeable to proceed.  All patient questions were answered to the best of my ability, and the patient agrees to the aforementioned plan of action with follow-up as indicated.   PLAN  Laboratories as outlined below to be drawn  at PCP office Continue PPI dosing 20 mg daily Consider diagnostic endoscopy to rule out Barrett's esophagus due to age and longstanding heartburn symptoms (even though well controlled) she has never had an endoscopy Colonoscopy for screening purposes to be scheduled this year    Orders Placed This Encounter  Procedures   Hepatic function panel   INR/PT    New Prescriptions   No medications on file   Modified Medications   No medications on file    Planned Follow Up No follow-ups on file.   Total Time in Face-to-Face and in Coordination of Care for patient including independent/personal interpretation/review of prior testing, medical history, examination, medication adjustment, communicating results with the patient directly, and documentation with the EHR is 25 minutes.   Justice Britain, MD [facility] Advanced [facility]oscopy Office # [phone]

## 2021-09-16 NOTE — Patient Instructions (Signed)
You have requested to have labs done at your Primary Care Physician office. Please make certain that they send results to our office.    If you are age 72 or older, your body mass index should be between 23-30. Your Body mass index is 32.98 kg/m. If this is out of the aforementioned range listed, please consider follow up with your Primary Care Provider.  If you are age 60 or younger, your body mass index should be between 19-25. Your Body mass index is 32.98 kg/m. If this is out of the aformentioned range listed, please consider follow up with your Primary Care Provider.   ________________________________________________________  The [facility] providers would like to encourage you to use [facility] to communicate with providers for non-urgent requests or questions.  Due to long hold times on the telephone, sending your provider a message by [facility] may be a faster and more efficient way to get a response.  Please allow 48 business hours for a response.  Please remember that this is for non-urgent requests.   It has been recommended to you by your physician that you have a(n) Colonoscopy completed. Per your request, we did not schedule the procedure(s) today. Please contact our office at [phone] should you decide to have the procedure completed.   Thank you for choosing me and [facility].  Dr. Rush Landmark

## 2021-09-19 ENCOUNTER — Encounter: Payer: Self-pay | Admitting: Gastroenterology

## 2021-09-19 DIAGNOSIS — K760 Fatty (change of) liver, not elsewhere classified: Secondary | ICD-10-CM | POA: Insufficient documentation

## 2021-09-19 DIAGNOSIS — Z1211 Encounter for screening for malignant neoplasm of colon: Secondary | ICD-10-CM | POA: Insufficient documentation

## 2021-10-12 DIAGNOSIS — Z20828 Contact with and (suspected) exposure to other viral communicable diseases: Secondary | ICD-10-CM | POA: Diagnosis not present

## 2021-10-25 DIAGNOSIS — K760 Fatty (change of) liver, not elsewhere classified: Secondary | ICD-10-CM | POA: Diagnosis not present

## 2021-10-25 DIAGNOSIS — E1165 Type 2 diabetes mellitus with hyperglycemia: Secondary | ICD-10-CM | POA: Diagnosis not present

## 2021-10-25 DIAGNOSIS — E785 Hyperlipidemia, unspecified: Secondary | ICD-10-CM | POA: Diagnosis not present

## 2021-10-25 DIAGNOSIS — Z0000 Encounter for general adult medical examination without abnormal findings: Secondary | ICD-10-CM | POA: Diagnosis not present

## 2021-10-25 DIAGNOSIS — E892 Postprocedural hypoparathyroidism: Secondary | ICD-10-CM | POA: Diagnosis not present

## 2021-10-25 DIAGNOSIS — E559 Vitamin D deficiency, unspecified: Secondary | ICD-10-CM | POA: Diagnosis not present

## 2021-10-25 DIAGNOSIS — I2510 Atherosclerotic heart disease of native coronary artery without angina pectoris: Secondary | ICD-10-CM | POA: Diagnosis not present

## 2021-10-25 DIAGNOSIS — I10 Essential (primary) hypertension: Secondary | ICD-10-CM | POA: Diagnosis not present

## 2021-11-02 ENCOUNTER — Encounter: Payer: Self-pay | Admitting: Gastroenterology

## 2021-11-17 ENCOUNTER — Other Ambulatory Visit: Payer: Self-pay | Admitting: Cardiology

## 2021-11-17 DIAGNOSIS — E7800 Pure hypercholesterolemia, unspecified: Secondary | ICD-10-CM

## 2021-11-25 DIAGNOSIS — Z20822 Contact with and (suspected) exposure to covid-19: Secondary | ICD-10-CM | POA: Diagnosis not present

## 2021-12-05 ENCOUNTER — Ambulatory Visit (AMBULATORY_SURGERY_CENTER): Payer: Federal, State, Local not specified - PPO | Admitting: *Deleted

## 2021-12-05 VITALS — Ht 68.0 in | Wt 210.0 lb

## 2021-12-05 DIAGNOSIS — Z8719 Personal history of other diseases of the digestive system: Secondary | ICD-10-CM

## 2021-12-05 DIAGNOSIS — Z1211 Encounter for screening for malignant neoplasm of colon: Secondary | ICD-10-CM

## 2021-12-05 NOTE — Progress Notes (Signed)
No egg or soy allergy known to patient  ?No issues known to pt with past sedation with any surgeries or procedures ?Patient denies ever being told they had issues or difficulty with intubation  ?No FH of Malignant Hyperthermia ?Pt is not on diet pills ?Pt is not on  home 02  ?Pt is not on blood thinners  ?Pt denies issues with constipation  ?No A fib or A flutter ? ? Coupon to pt in PV today , Code to Pharmacy and  NO PA's for preps discussed with pt In PV today  ?Discussed with pt there will be an out-of-pocket cost for prep and that varies from $0 to 70 +  dollars - pt verbalized understanding  ?Pt instructed to use Singlecare.com or GoodRx for a price reduction on prep  ? ?PV completed over the phone. Pt verified name, DOB, address and insurance during PV today.  ?Pt mailed instruction packet with copy of consent form to read and not return, and instructions.  ?Pt encouraged to call with questions or issues.  ?If pt has My chart, procedure instructions sent via My Chart  ?Insurance confirmed with pt at [facility] today . ? ?Sample sheet of over the counter items to purchase for prep mailed with packet. ?

## 2021-12-12 ENCOUNTER — Telehealth: Payer: Self-pay | Admitting: Gastroenterology

## 2021-12-12 NOTE — Telephone Encounter (Signed)
Recall placed for 3 to 4 months.  ?

## 2021-12-12 NOTE — Telephone Encounter (Signed)
Patient called to cancel procedure scheduled 12/16/21. Per patient, has dental problems to deal with first. Will call back to reschedule.  ?

## 2021-12-12 NOTE — Telephone Encounter (Signed)
No late cancellation fee for this first cancellation. ?Please place recall for 3 to 4 months from now in case she has not called to reschedule. ?If she cancels again in the future she will need to be assessed the late cancellation fee. ?GM ?

## 2021-12-16 ENCOUNTER — Encounter: Payer: Federal, State, Local not specified - PPO | Admitting: Gastroenterology

## 2021-12-19 DIAGNOSIS — Z20822 Contact with and (suspected) exposure to covid-19: Secondary | ICD-10-CM | POA: Diagnosis not present

## 2022-02-24 DIAGNOSIS — M170 Bilateral primary osteoarthritis of knee: Secondary | ICD-10-CM | POA: Diagnosis not present

## 2022-02-24 DIAGNOSIS — E785 Hyperlipidemia, unspecified: Secondary | ICD-10-CM | POA: Diagnosis not present

## 2022-02-24 DIAGNOSIS — K219 Gastro-esophageal reflux disease without esophagitis: Secondary | ICD-10-CM | POA: Diagnosis not present

## 2022-02-24 DIAGNOSIS — I700 Atherosclerosis of aorta: Secondary | ICD-10-CM | POA: Diagnosis not present

## 2022-02-24 DIAGNOSIS — E559 Vitamin D deficiency, unspecified: Secondary | ICD-10-CM | POA: Diagnosis not present

## 2022-02-24 DIAGNOSIS — I10 Essential (primary) hypertension: Secondary | ICD-10-CM | POA: Diagnosis not present

## 2022-02-24 DIAGNOSIS — E1165 Type 2 diabetes mellitus with hyperglycemia: Secondary | ICD-10-CM | POA: Diagnosis not present

## 2022-02-24 DIAGNOSIS — I2510 Atherosclerotic heart disease of native coronary artery without angina pectoris: Secondary | ICD-10-CM | POA: Diagnosis not present

## 2022-04-04 ENCOUNTER — Encounter: Payer: Self-pay | Admitting: Gastroenterology

## 2022-05-19 DIAGNOSIS — Z23 Encounter for immunization: Secondary | ICD-10-CM | POA: Diagnosis not present

## 2022-05-31 DIAGNOSIS — G4733 Obstructive sleep apnea (adult) (pediatric): Secondary | ICD-10-CM | POA: Diagnosis not present

## 2022-06-06 ENCOUNTER — Encounter: Payer: Self-pay | Admitting: Gastroenterology

## 2022-07-01 DIAGNOSIS — G4733 Obstructive sleep apnea (adult) (pediatric): Secondary | ICD-10-CM | POA: Diagnosis not present

## 2022-07-10 ENCOUNTER — Ambulatory Visit (AMBULATORY_SURGERY_CENTER): Payer: Self-pay | Admitting: *Deleted

## 2022-07-10 ENCOUNTER — Other Ambulatory Visit: Payer: Self-pay

## 2022-07-10 VITALS — Ht 68.5 in | Wt 200.0 lb

## 2022-07-10 DIAGNOSIS — Z1211 Encounter for screening for malignant neoplasm of colon: Secondary | ICD-10-CM

## 2022-07-10 DIAGNOSIS — K219 Gastro-esophageal reflux disease without esophagitis: Secondary | ICD-10-CM

## 2022-07-10 MED ORDER — NA SULFATE-K SULFATE-MG SULF 17.5-3.13-1.6 GM/177ML PO SOLN: 1.0000 | Freq: Once | ORAL | 0 refills | Status: AC

## 2022-07-10 NOTE — Progress Notes (Signed)
Pre visit completed in person.   No egg or soy allergy known to patient  No issues known to pt with past sedation with any surgeries or procedures Patient denies ever being told they had issues or difficulty with intubation  No FH of Malignant Hyperthermia Pt is not on diet pills Pt is not on  home 02  Pt is not on blood thinners  Pt denies issues with constipation  No A fib or A flutter  Pt instructed to use Singlecare.com or GoodRx for a price reduction on prep  / coupon for [facility] provided

## 2022-07-27 DIAGNOSIS — I10 Essential (primary) hypertension: Secondary | ICD-10-CM | POA: Diagnosis not present

## 2022-07-27 DIAGNOSIS — E785 Hyperlipidemia, unspecified: Secondary | ICD-10-CM | POA: Diagnosis not present

## 2022-07-31 DIAGNOSIS — G4733 Obstructive sleep apnea (adult) (pediatric): Secondary | ICD-10-CM | POA: Diagnosis not present

## 2022-08-03 ENCOUNTER — Other Ambulatory Visit: Payer: Self-pay | Admitting: Endocrinology

## 2022-08-03 DIAGNOSIS — E210 Primary hyperparathyroidism: Secondary | ICD-10-CM | POA: Diagnosis not present

## 2022-08-03 DIAGNOSIS — E892 Postprocedural hypoparathyroidism: Secondary | ICD-10-CM | POA: Diagnosis not present

## 2022-08-03 DIAGNOSIS — I10 Essential (primary) hypertension: Secondary | ICD-10-CM | POA: Diagnosis not present

## 2022-08-03 DIAGNOSIS — E785 Hyperlipidemia, unspecified: Secondary | ICD-10-CM | POA: Diagnosis not present

## 2022-08-03 DIAGNOSIS — E559 Vitamin D deficiency, unspecified: Secondary | ICD-10-CM | POA: Diagnosis not present

## 2022-08-09 ENCOUNTER — Encounter: Payer: Self-pay | Admitting: Gastroenterology

## 2022-08-09 ENCOUNTER — Ambulatory Visit (AMBULATORY_SURGERY_CENTER): Payer: Federal, State, Local not specified - PPO | Admitting: Gastroenterology

## 2022-08-09 VITALS — BP 126/63 | HR 67 | Temp 97.5°F | Resp 12 | Ht 67.0 in | Wt 200.0 lb

## 2022-08-09 DIAGNOSIS — Z1211 Encounter for screening for malignant neoplasm of colon: Secondary | ICD-10-CM | POA: Diagnosis not present

## 2022-08-09 DIAGNOSIS — K2970 Gastritis, unspecified, without bleeding: Secondary | ICD-10-CM

## 2022-08-09 DIAGNOSIS — D124 Benign neoplasm of descending colon: Secondary | ICD-10-CM

## 2022-08-09 DIAGNOSIS — K259 Gastric ulcer, unspecified as acute or chronic, without hemorrhage or perforation: Secondary | ICD-10-CM

## 2022-08-09 DIAGNOSIS — K317 Polyp of stomach and duodenum: Secondary | ICD-10-CM

## 2022-08-09 DIAGNOSIS — D123 Benign neoplasm of transverse colon: Secondary | ICD-10-CM

## 2022-08-09 DIAGNOSIS — K219 Gastro-esophageal reflux disease without esophagitis: Secondary | ICD-10-CM

## 2022-08-09 DIAGNOSIS — D125 Benign neoplasm of sigmoid colon: Secondary | ICD-10-CM | POA: Diagnosis not present

## 2022-08-09 MED ORDER — OMEPRAZOLE 20 MG PO CPDR: 20.0000 mg | DELAYED_RELEASE_CAPSULE | Freq: Every day | ORAL | 1 refills | Status: AC

## 2022-08-09 MED ORDER — SUCRALFATE 1 GM/10ML PO SUSP: 1.0000 g | Freq: Two times a day (BID) | ORAL | 1 refills | Status: AC

## 2022-08-09 MED ORDER — SODIUM CHLORIDE 0.9 % IV SOLN: 500.0000 mL | Freq: Once | INTRAVENOUS | Status: AC

## 2022-08-09 NOTE — Progress Notes (Signed)
GASTROENTEROLOGY PROCEDURE H&P NOTE   Primary Care Physician: Deland Pretty, MD  HPI: Alexandra Gibson is a 71 y.o. female who presents for EGD/Colonoscopy for evaluation of possible Barrett's Esophagus (screening as she is at risk) and Colon Cancer screening.  Past Medical History:  Diagnosis Date   Allergy    SEASONAL   Anxiety    Anxiety and depression    Arthritis    Cataract    BILATERAL,REMOVED   Depression    Fibromyalgia    GERD (gastroesophageal reflux disease)    History of kidney stones    History of urinary tract infection    Hyperlipidemia    Hypertension    Kidney stone    10-2016; " i had a kidney stone that wouldnt move so they had to go in to take it out "   Numbness    right hand/comes and goes    Osteoarthritis of knee    bil-gets injections   Parathyroid tumor    Pre-diabetes    Sleep apnea    CPAP   Urinary frequency    Wears glasses    Past Surgical History:  Procedure Laterality Date   APPENDECTOMY     CYSTOSCOPY WITH STENT PLACEMENT Left 10/27/2015   Procedure: CYSTOSCOPY WITH STENT PLACEMENT;  Surgeon: Nickie Retort, MD;  Location: [facility];  Service: Urology;  Laterality: Left;   LAPAROSCOPIC ENDOMETRIOSIS FULGURATION  early 1970s   PARATHYROIDECTOMY Left 01/21/2021   Procedure: LEFT INFERIOR PARATHYROIDECTOMY;  Surgeon: Armandina Gemma, MD;  Location: [facility];  Service: General;  Laterality: Left;   TOTAL HIP ARTHROPLASTY Left 08/04/2015   Procedure: TOTAL LEFT  HIP ARTHROPLASTY ANTERIOR APPROACH;  Surgeon: Gaynelle Arabian, MD;  Location: [facility];  Service: Orthopedics;  Laterality: Left;   TOTAL HIP ARTHROPLASTY Right 11/07/2017   Procedure: RIGHT TOTAL HIP ARTHROPLASTY ANTERIOR APPROACH;  Surgeon: Gaynelle Arabian, MD;  Location: [facility];  Service: Orthopedics;  Laterality: Right;   TOTAL KNEE ARTHROPLASTY Left 06/29/2014   Procedure: TOTAL KNEE ARTHROPLASTY LEFT ;  Surgeon: Gearlean Alf, MD;  Location: [facility];  Service: Orthopedics;   Laterality: Left;   TOTAL KNEE ARTHROPLASTY Right 09/18/2016   Procedure: RIGHT TOTAL KNEE ARTHROPLASTY;  Surgeon: Gaynelle Arabian, MD;  Location: [facility];  Service: Orthopedics;  Laterality: Right;  Adductor Block   URETEROSCOPY WITH HOLMIUM LASER LITHOTRIPSY Left 10/27/2015   Procedure: URETEROSCOPY WITH HOLMIUM LASER LITHOTRIPSY;  Surgeon: Nickie Retort, MD;  Location: [facility];  Service: Urology;  Laterality: Left;   Current Outpatient Medications  Medication Sig Dispense Refill   Alpha Lipoic Acid 200 MG CAPS 1 capsule     amLODipine (NORVASC) 5 MG tablet Take 5 mg by mouth daily.     celecoxib (CELEBREX) 100 MG capsule Take 200 mg by mouth daily. (Patient not taking: Reported on 07/10/2022)     cetirizine (ZYRTEC) 10 MG tablet Take 10 mg by mouth at bedtime.     ezetimibe (ZETIA) 10 MG tablet TAKE 1 TABLET BY MOUTH EVERY DAY AFTER SUPPER 90 tablet 0   Fluticasone Propionate (FLONASE NA) Place into the nose. SQUIRT EACH NOSTRILS ONCE A DAY     irbesartan (AVAPRO) 300 MG tablet Take 300 mg by mouth every morning.     meloxicam (MOBIC) 15 MG tablet Take 15 mg by mouth daily.     omeprazole (PRILOSEC) 20 MG capsule Take 20 mg by mouth every morning.     pravastatin (PRAVACHOL) 40 MG tablet Take 40 mg by  mouth every evening.     spironolactone (ALDACTONE) 25 MG tablet Take 1 tablet (25 mg total) by mouth every morning. 30 tablet 2   tetrahydrozoline-zinc (EYE DROPS ALLERGY RELIEF) 0.05-0.25 % ophthalmic solution Place 2 drops into both eyes daily as needed (allergies).     No current facility-administered medications for this visit.   Facility-Administered Medications Ordered in Other Visits  Medication Dose Route Frequency Provider Last Rate Last Admin   microfibrllar collagen (AVITENE) pad    PRN Armandina Gemma, MD   1 each at 01/21/21 1445    Current Outpatient Medications:    Alpha Lipoic Acid 200 MG CAPS, 1 capsule, Disp: , Rfl:    amLODipine (NORVASC) 5 MG tablet, Take 5 mg by  mouth daily., Disp: , Rfl:    celecoxib (CELEBREX) 100 MG capsule, Take 200 mg by mouth daily. (Patient not taking: Reported on 07/10/2022), Disp: , Rfl:    cetirizine (ZYRTEC) 10 MG tablet, Take 10 mg by mouth at bedtime., Disp: , Rfl:    ezetimibe (ZETIA) 10 MG tablet, TAKE 1 TABLET BY MOUTH EVERY DAY AFTER SUPPER, Disp: 90 tablet, Rfl: 0   Fluticasone Propionate (FLONASE NA), Place into the nose. SQUIRT EACH NOSTRILS ONCE A DAY, Disp: , Rfl:    irbesartan (AVAPRO) 300 MG tablet, Take 300 mg by mouth every morning., Disp: , Rfl:    meloxicam (MOBIC) 15 MG tablet, Take 15 mg by mouth daily., Disp: , Rfl:    omeprazole (PRILOSEC) 20 MG capsule, Take 20 mg by mouth every morning., Disp: , Rfl:    pravastatin (PRAVACHOL) 40 MG tablet, Take 40 mg by mouth every evening., Disp: , Rfl:    spironolactone (ALDACTONE) 25 MG tablet, Take 1 tablet (25 mg total) by mouth every morning., Disp: 30 tablet, Rfl: 2   tetrahydrozoline-zinc (EYE DROPS ALLERGY RELIEF) 0.05-0.25 % ophthalmic solution, Place 2 drops into both eyes daily as needed (allergies)., Disp: , Rfl:  No current facility-administered medications for this visit.  Facility-Administered Medications Ordered in Other Visits:    microfibrllar collagen (AVITENE) pad, , , PRN, Armandina Gemma, MD, 1 each at 01/21/21 1445 Allergies  Allergen Reactions   Lipitor [Atorvastatin] Other (See Comments)    REACTION: leg cramp   Family History  Problem Relation Age of Onset   Hypertension Mother    Colon cancer Neg Hx    Kidney disease Neg Hx    Bladder Cancer Neg Hx    Esophageal cancer Neg Hx    Pancreatic cancer Neg Hx    Rectal cancer Neg Hx    Stomach cancer Neg Hx    Inflammatory bowel disease Neg Hx    Liver disease Neg Hx    Colon polyps Neg Hx    Crohn's disease Neg Hx    Social History   Socioeconomic History   Marital status: Married    Spouse name: Not on file   Number of children: 2   Years of education: Not on file   Highest  education level: Not on file  Occupational History   Not on file  Tobacco Use   Smoking status: Never   Smokeless tobacco: Never  Vaping Use   Vaping Use: Never used  Substance and Sexual Activity   Alcohol use: No   Drug use: No   Sexual activity: Not on file  Other Topics Concern   Not on file  Social History Narrative   Not on file   Social Determinants of Health   Financial Resource  Strain: Not on file  Food Insecurity: Not on file  Transportation Needs: Not on file  Physical Activity: Not on file  Stress: Not on file  Social Connections: Not on file  Intimate Partner Violence: Not on file    Physical Exam: There were no vitals filed for this visit. There is no height or weight on file to calculate BMI. GEN: NAD EYE: Sclerae anicteric ENT: MMM CV: Non-tachycardic GI: Soft, NT/ND NEURO:  Alert & Oriented x 3  Lab Results: No results for input(s): "WBC", "HGB", "HCT", "PLT" in the last 72 hours. BMET No results for input(s): "NA", "K", "CL", "CO2", "GLUCOSE", "BUN", "CREATININE", "CALCIUM" in the last 72 hours. LFT No results for input(s): "PROT", "ALBUMIN", "AST", "ALT", "ALKPHOS", "BILITOT", "BILIDIR", "IBILI" in the last 72 hours. PT/INR No results for input(s): "LABPROT", "INR" in the last 72 hours.   Impression / Plan: This is a 71 y.o.female who presents for EGD/Colonoscopy for evaluation of possible Barrett's Esophagus (screening as she is at risk) and Colon Cancer screening.  The risks and benefits of endoscopic evaluation/treatment were discussed with the patient and/or family; these include but are not limited to the risk of perforation, infection, bleeding, missed lesions, lack of diagnosis, severe illness requiring hospitalization, as well as anesthesia and sedation related illnesses.  The patient's history has been reviewed, patient examined, no change in status, and deemed stable for procedure.  The patient and/or family is agreeable to proceed.     Justice Britain, MD [facility] Advanced Endoscopy Office # [phone]

## 2022-08-09 NOTE — Progress Notes (Signed)
Pt's states no medical or surgical changes since previsit or office visit. 

## 2022-08-09 NOTE — Op Note (Signed)
[facility] Patient Name: Alexandra Gibson Procedure Date: 08/09/2022 8:53 AM MRN: [id number] Endoscopist: Justice Britain , MD, [id number] Age: 71 Referring MD:  Date of Birth: [birth year] Gender: Female Account #: [id number] Procedure:                Upper GI endoscopy Indications:              Heartburn, Screening for Barrett's esophagus in                            patient at risk for this condition Medicines:                Monitored Anesthesia Care Procedure:                Pre-Anesthesia Assessment:                           - Prior to the procedure, a History and Physical                            was performed, and patient medications and                            allergies were reviewed. The patient's tolerance of                            previous anesthesia was also reviewed. The risks                            and benefits of the procedure and the sedation                            options and risks were discussed with the patient.                            All questions were answered, and informed consent                            was obtained. Prior Anticoagulants: The patient has                            taken no anticoagulant or antiplatelet agents. ASA                            Grade Assessment: II - A patient with mild systemic                            disease. After reviewing the risks and benefits,                            the patient was deemed in satisfactory condition to                            undergo the procedure.  After obtaining informed consent, the endoscope was                            passed under direct vision. Throughout the                            procedure, the patient's blood pressure, pulse, and                            oxygen saturations were monitored continuously. The                            Endoscope was introduced through the mouth, and                            advanced to the  second part of duodenum. The upper                            GI endoscopy was accomplished without difficulty.                            The patient tolerated the procedure. Scope In: Scope Out: Findings:                 The examined esophagus was moderately tortuous.                           No gross mucosal lesions were noted in the entire                            esophagus.                           The Z-line was irregular and was found 39 cm from                            the incisors.                           A 3 cm hiatal hernia was present.                           The gastroesophageal flap valve was visualized                            endoscopically and classified as Hill Grade IV (no                            fold, wide open lumen, hiatal hernia present).                           A single 9 mm sessile polyp with no bleeding and no                            stigmata of recent bleeding  was found in the                            gastric antrum. The polyp was removed with a cold                            snare. Resection and retrieval were complete.                           Patchy mild inflammation characterized by erosions,                            erythema and granularity was found in the entire                            examined stomach. Biopsies were taken with a cold                            forceps for histology and Helicobacter pylori                            testing.                           No gross lesions were noted in the duodenal bulb,                            in the first portion of the duodenum and in the                            second portion of the duodenum. Complications:            No immediate complications. Estimated Blood Loss:     Estimated blood loss was minimal. Impression:               - Tortuous esophagus. No gross mucosal lesions in                            the entire esophagus. Z-line irregular, 39 cm from                             the incisors.                           - 3 cm hiatal hernia. Gastroesophageal flap valve                            classified as Hill Grade IV (no fold, wide open                            lumen, hiatal hernia present).                           - A single gastric antral polyp. Resected and  retrieved.                           - Gastritis. Biopsied.                           - No gross lesions in the duodenal bulb, in the                            first portion of the duodenum and in the second                            portion of the duodenum. Recommendation:           - Proceed to scheduled colonoscopy.                           - Increase Omeprazole to 20 mg twice daily for at                            least next 31-month                           - Carafate 1 g twice daily for 2-weeks.                           - Continue present medications.                           - Observe patient's clinical course.                           - Await pathology results.                           - Repeat upper endoscopy for surveillance based on                            pathology results.                           - The findings and recommendations were discussed                            with the patient.                           - The findings and recommendations were discussed                            with the patient's family. GJustice Britain MD 08/09/2022 9:27:19 AM

## 2022-08-09 NOTE — Progress Notes (Signed)
Report to PACU, RN, vss, BBS= Clear.  

## 2022-08-09 NOTE — Progress Notes (Signed)
Called to room to assist during endoscopic procedure.  Patient ID and intended procedure confirmed with present staff. Received instructions for my participation in the procedure from the performing physician.  

## 2022-08-09 NOTE — Patient Instructions (Addendum)
Take your omeprazole 1/2 hour before breakfast. Do not take it on a full stomach. Take your carafate as ordered on the label.  Resume your medications as ordered. Read all of your mediations today as ordered.  YOU HAD AN ENDOSCOPIC PROCEDURE TODAY AT [facility]:   Refer to the procedure report that was given to you for any specific questions about what was found during the examination.  If the procedure report does not answer your questions, please call your gastroenterologist to clarify.  If you requested that your care partner not be given the details of your procedure findings, then the procedure report has been included in a sealed envelope for you to review at your convenience later.  YOU SHOULD EXPECT: Some feelings of bloating in the abdomen. Passage of more gas than usual.  Walking can help get rid of the air that was put into your GI tract during the procedure and reduce the bloating. If you had a lower endoscopy (such as a colonoscopy or flexible sigmoidoscopy) you may notice spotting of blood in your stool or on the toilet paper. If you underwent a bowel prep for your procedure, you may not have a normal bowel movement for a few days.  Please Note:  You might notice some irritation and congestion in your nose or some drainage.  This is from the oxygen used during your procedure.  There is no need for concern and it should clear up in a day or so.  SYMPTOMS TO REPORT IMMEDIATELY:  Following lower endoscopy (colonoscopy or flexible sigmoidoscopy):  Excessive amounts of blood in the stool  Significant tenderness or worsening of abdominal pains  Swelling of the abdomen that is new, acute  Fever of 100F or higher  Following upper endoscopy (EGD)  Vomiting of blood or coffee ground material  New chest pain or pain under the shoulder blades  Painful or persistently difficult swallowing  New shortness of breath  Fever of 100F or higher  Black, tarry-looking stools  For  urgent or emergent issues, a gastroenterologist can be reached at any hour by calling [phone]. Do not use MyChart messaging for urgent concerns.    DIET:  We do recommend a small meal at first, but then you may proceed to your regular diet.  Drink plenty of fluids but you should avoid alcoholic beverages for 24 hours. Try to increase the fiber in your diet. Take fiber-con 1-2 tablets per day as ordered.  ACTIVITY:  You should plan to take it easy for the rest of today and you should NOT DRIVE or use heavy machinery until tomorrow (because of the sedation medicines used during the test).    FOLLOW UP: Our staff will call the number listed on your records the next business day following your procedure.  We will call around 7:15- 8:00 am to check on you and address any questions or concerns that you may have regarding the information given to you following your procedure. If we do not reach you, we will leave a message.     If any biopsies were taken you will be contacted by phone or by letter within the next 1-3 weeks.  Please call us at [phone] if you have not heard about the biopsies in 3 weeks.    SIGNATURES/CONFIDENTIALITY: You and/or your care partner have signed paperwork which will be entered into your electronic medical record.  These signatures attest to the fact that that the information above on your After Visit  Summary has been reviewed and is understood.  Full responsibility of the confidentiality of this discharge information lies with you and/or your care-partner. 

## 2022-08-09 NOTE — Op Note (Signed)
[facility] Patient Name: Alexandra Gibson Procedure Date: 08/09/2022 8:46 AM MRN: [id number] Endoscopist: Justice Britain , MD, [id number] Age: 71 Referring MD:  Date of Birth: [birth year] Gender: Female Account #: [id number] Procedure:                Colonoscopy Indications:              Screening for colorectal malignant neoplasm Medicines:                Monitored Anesthesia Care Procedure:                Pre-Anesthesia Assessment:                           - Prior to the procedure, a History and Physical                            was performed, and patient medications and                            allergies were reviewed. The patient's tolerance of                            previous anesthesia was also reviewed. The risks                            and benefits of the procedure and the sedation                            options and risks were discussed with the patient.                            All questions were answered, and informed consent                            was obtained. Prior Anticoagulants: The patient has                            taken no anticoagulant or antiplatelet agents. ASA                            Grade Assessment: II - A patient with mild systemic                            disease. After reviewing the risks and benefits,                            the patient was deemed in satisfactory condition to                            undergo the procedure.                           After obtaining informed consent, the colonoscope  was passed under direct vision. Throughout the                            procedure, the patient's blood pressure, pulse, and                            oxygen saturations were monitored continuously. The                            CF [id number] #[id number] was introduced through the anus                            and advanced to the the cecum, identified by                            appendiceal  orifice and ileocecal valve. The                            colonoscopy was performed without difficulty. The                            patient tolerated the procedure. The quality of the                            bowel preparation was adequate. The ileocecal                            valve, appendiceal orifice, and rectum were                            photographed. Scope In: 9:08:34 AM Scope Out: 9:22:04 AM Scope Withdrawal Time: 0 hours 10 minutes 4 seconds  Total Procedure Duration: 0 hours 13 minutes 30 seconds  Findings:                 Skin tags were found on perianal exam.                           The digital rectal exam findings include                            hemorrhoids. Pertinent negatives include no                            palpable rectal lesions.                           The left colon was moderately tortuous.                           Three sessile polyps were found in the sigmoid                            colon, descending colon and transverse colon. The  polyps were 3 to 8 mm in size. These polyps were                            removed with a cold snare. Resection and retrieval                            were complete.                           Multiple small-mouthed diverticula were found in                            the recto-sigmoid colon and sigmoid colon.                           Normal mucosa was found in the entire colon                            otherwise.                           Non-bleeding non-thrombosed external and internal                            hemorrhoids were found during retroflexion, during                            perianal exam and during digital exam. The                            hemorrhoids were Grade II (internal hemorrhoids                            that prolapse but reduce spontaneously). Complications:            No immediate complications. Estimated Blood Loss:     Estimated blood loss was  minimal. Impression:               - Perianal skin tags found on perianal exam.                           - Hemorrhoids found on digital rectal exam.                           - Tortuous colon.                           - Three 3 to 8 mm polyps in the sigmoid colon, in                            the descending colon and in the transverse colon,                            removed with a cold snare. Resected and retrieved.                           -  Diverticulosis in the recto-sigmoid colon and in                            the sigmoid colon.                           - Normal mucosa in the entire examined colon                            otherwise.                           - Non-bleeding non-thrombosed external and internal                            hemorrhoids. Recommendation:           - The patient will be observed post-procedure,                            until all discharge criteria are met.                           - Discharge patient to home.                           - Patient has a contact number available for                            emergencies. The signs and symptoms of potential                            delayed complications were discussed with the                            patient. Return to normal activities tomorrow.                            Written discharge instructions were provided to the                            patient.                           - High fiber diet.                           - Use FiberCon 1-2 tablets PO daily.                           - Continue present medications.                           - Await pathology results.                           - Repeat colonoscopy in 3/5/7 years for  surveillance based on pathology results.                           - The findings and recommendations were discussed                            with the patient.                           - The findings and recommendations were  discussed                            with the patient's family. Justice Britain, MD 08/09/2022 9:30:58 AM

## 2022-08-10 ENCOUNTER — Telehealth: Payer: Self-pay | Admitting: *Deleted

## 2022-08-10 NOTE — Telephone Encounter (Signed)
  Follow up Call-     08/09/2022    7:51 AM  Call back number  Post procedure Call Back phone  # [phone]  Permission to leave phone message Yes     Patient questions:  Do you have a fever, pain , or abdominal swelling? No. Pain Score  0 *  Have you tolerated food without any problems? Yes.    Have you been able to return to your normal activities? Yes.    Do you have any questions about your discharge instructions: Diet   No. Medications  No. Follow up visit  No.  Do you have questions or concerns about your Care? No.  Actions: * If pain score is 4 or above: Physician/ provider Notified :  none .

## 2022-08-12 ENCOUNTER — Encounter: Payer: Self-pay | Admitting: Gastroenterology

## 2022-09-06 ENCOUNTER — Telehealth: Payer: Self-pay | Admitting: Gastroenterology

## 2022-09-06 NOTE — Telephone Encounter (Signed)
Inbound call from patient regarding a medication"Sulcrafate"  states she have 1 refill left and want to know is she have to continue taking it because its a little expensive. Please advise

## 2022-09-07 NOTE — Telephone Encounter (Signed)
Patient returned your call, please advise. 

## 2022-09-07 NOTE — Telephone Encounter (Signed)
Per the procedure report the pt was to take carafarte twice daily for 2 weeks.  She has been advised that she can stop and call us if she has any problems or concerns.

## 2022-09-07 NOTE — Telephone Encounter (Signed)
Left message on machine to call back  

## 2022-09-11 IMAGING — CT CT CARDIAC CORONARY ARTERY CALCIUM SCORE
3 series · 14 of 20 positions shown, 16 images · non-contrast
Comparison: Abdominal CT 10/07/2015

CLINICAL DATA: 70-year-old black female with hyperlipidemia.

EXAM:
CT CARDIAC CORONARY ARTERY CALCIUM SCORE
TECHNIQUE: Non-contrast imaging through the heart was performed using
prospective ECG gating. Image post processing was performed on an
independent workstation, allowing for quantitative analysis of the
heart and coronary arteries. Note that this exam targets the heart
and the chest was not imaged in its entirety.

[Series 2: calcium scoring 2.00 qr36 bestdiast 65% hrt calciu · axial · 0.42mm/px · z∈[+1666,+1750]mm · 4 of 70 slices shown]
[im 14/70  vessel]
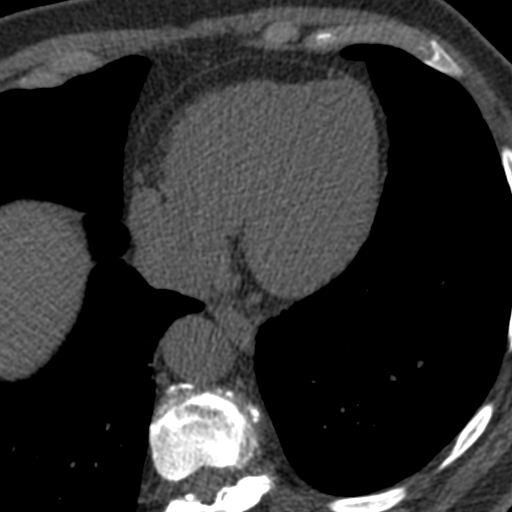
[im 28/70  vessel]
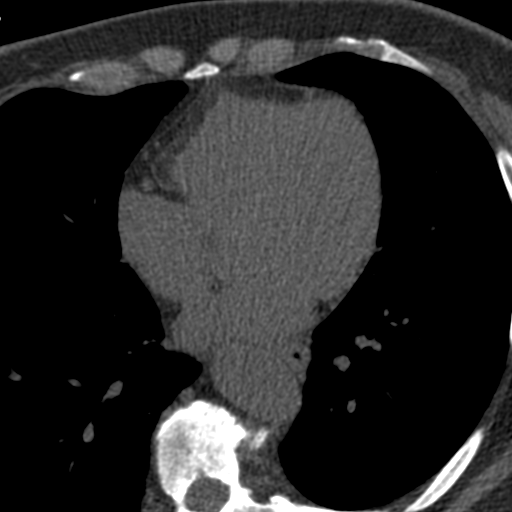
[im 42/70  vessel]
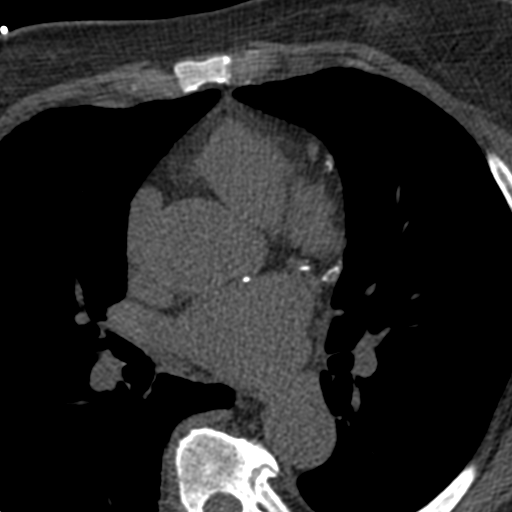
[im 56/70  vessel]
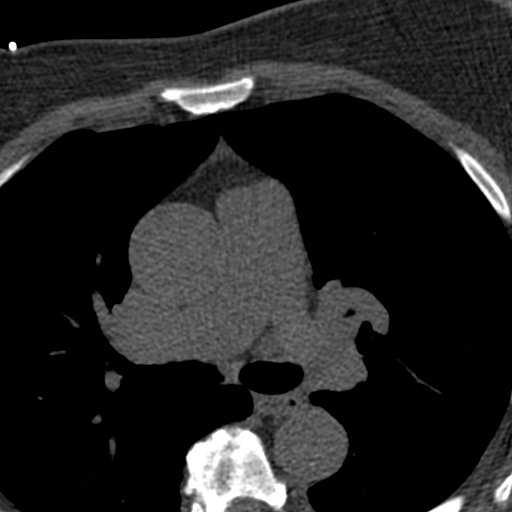

[Series 3: calcium scoring 2.00 br40 bestdiast 65% axial · axial · 0.61mm/px · z∈[+1662,+1754]mm · 5 of 70 slices shown, 7 images]
[im 12/70  vessel]
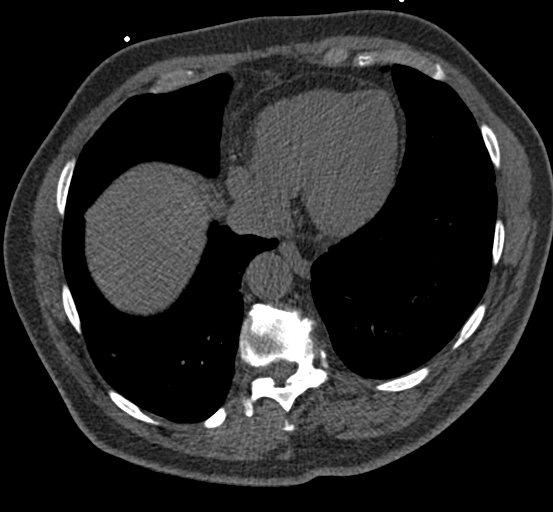
[im 12/70  lung]
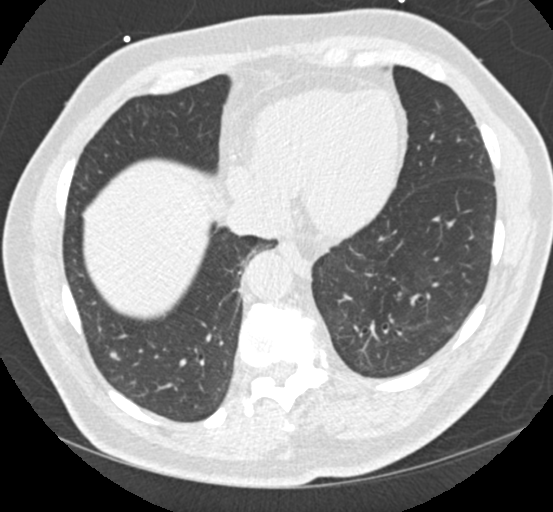
[im 24/70  vessel]
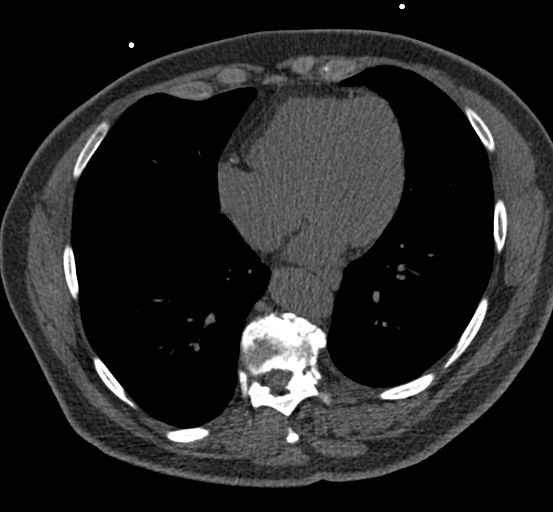
[im 35/70  vessel]
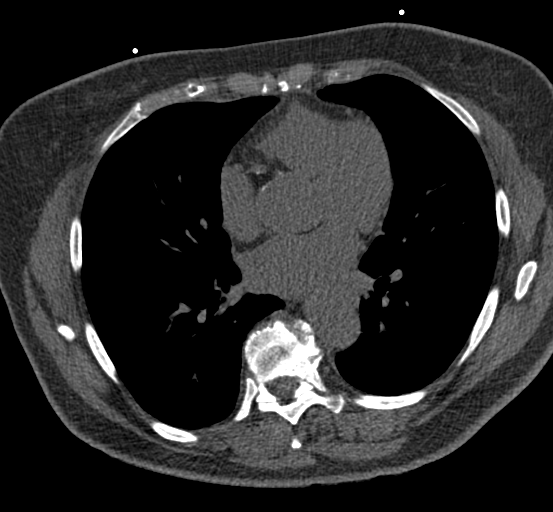
[im 47/70  vessel]
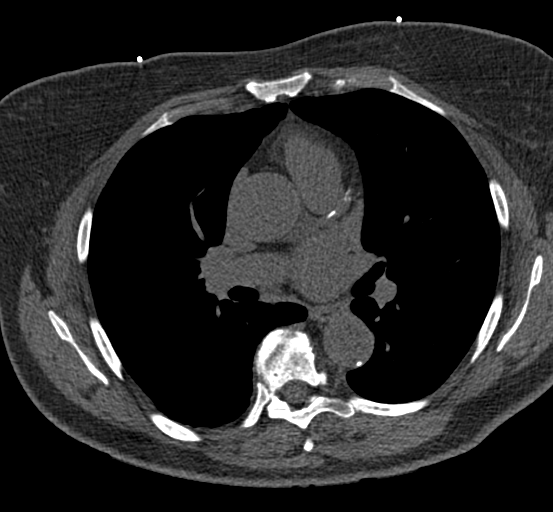
[im 58/70  vessel]
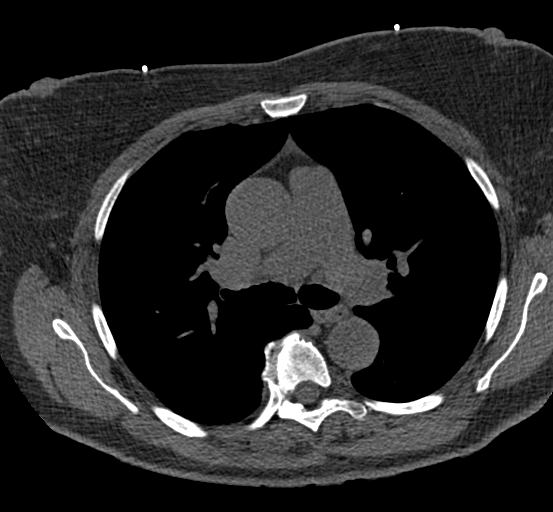
[im 58/70  lung]
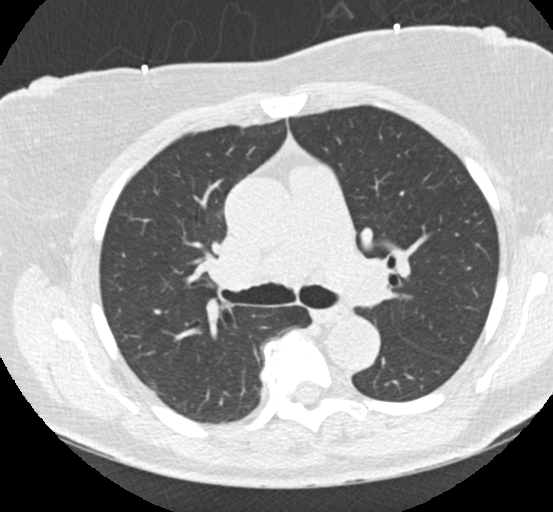

[Series 9: calcium scoring 2.00 br60 bestdiast 65% lungs · axial · 0.61mm/px · z∈[+1662,+1754]mm · 5 of 70 slices shown]
[im 12/70  vessel]
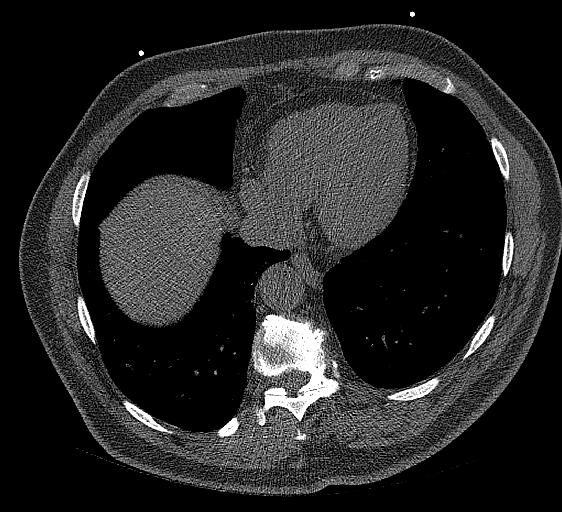
[im 24/70  vessel]
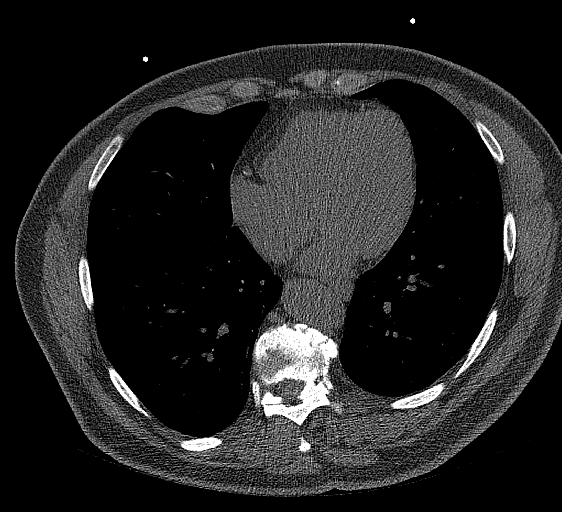
[im 35/70  vessel]
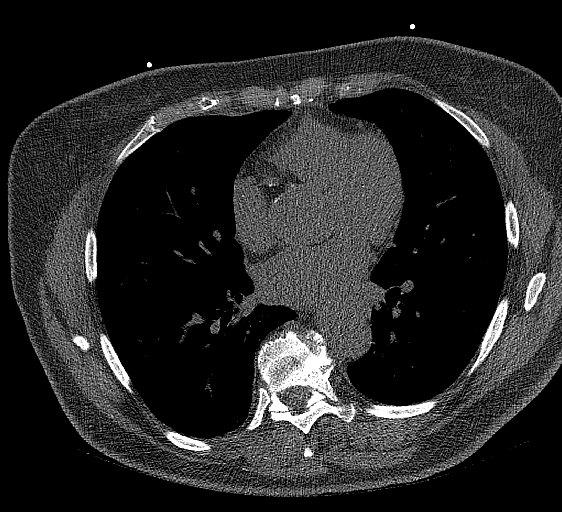
[im 47/70  vessel]
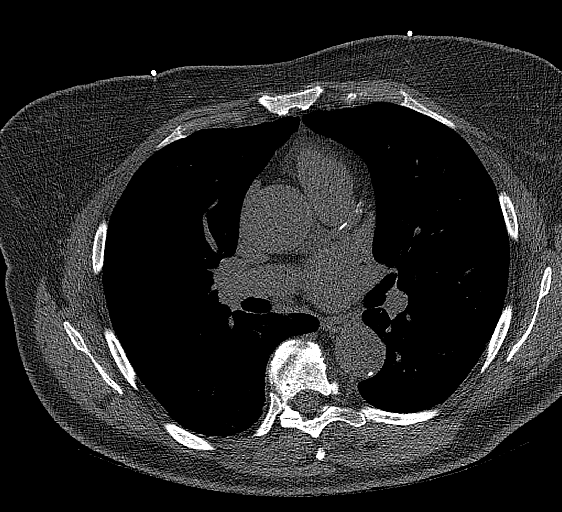
[im 58/70  vessel]
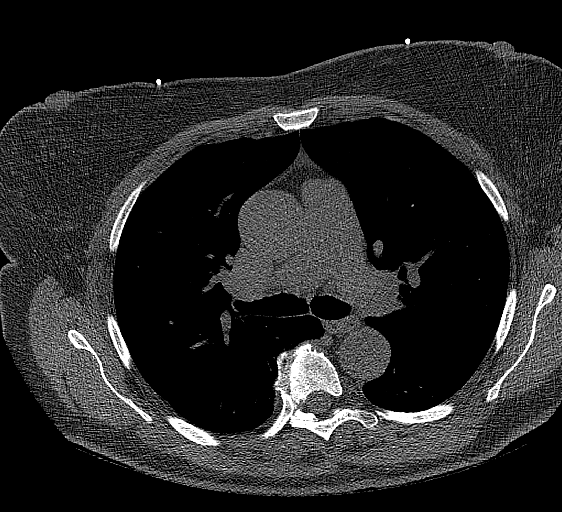

[14 of 20 positions shown; findings below may reference images not displayed]

FINDINGS: CORONARY CALCIUM SCORES:

Left Main: 152

LAD: 546

LCx: 155

RCA: 128

Total Agatston Score: 981

[HOSPITAL] percentile: 97

AORTA MEASUREMENTS:

Ascending Aorta: 42 mm

Descending Aorta: 36 mm

OTHER FINDINGS:

Atherosclerotic calcifications involving the thoracic aorta. Heart
size is normal without significant pericardial fluid. Images of the
upper abdomen are unremarkable. Small hiatal hernia. Visualized
mediastinal structures are normal.Tiny nodule in the right lower
lobe on sequence 9, image 59 is stable since [DATE] mm nodule in
the left lower lobe on sequence 9 image 70 is unchanged since
07/28/2020 but not clear if it was present on the exam from 0416.
There are 2 small adjacent nodules in the left lower lobe, largest
measuring 7 mm and these are stable since 0416 and most compatible
with benign nodules. Scattered peripheral ground-glass densities are
similar to prior examination and could represent areas of scarring.
No large pleural effusions. Multilevel degenerative changes in the
thoracic spine. Rightward curvature in the lower thoracic spine.
IMPRESSION: 1. Coronary calcium score is 981 and this is at percentile 97 for
patients of the same age, gender and ethnicity.
2. Fusiform aneurysm of the ascending thoracic aorta measuring up to
4.2 cm. Recommend annual imaging followup by CTA or MRA. This
recommendation follows 3838
ACCF/AHA/AATS/ACR/ASA/SCA/SOHIBUD DUFUF/MARA/JIM/JANDRES Guidelines for the
Diagnosis and Management of Patients with Thoracic Aortic Disease.
Circulation. 3838; 121: E266-e369. Aortic aneurysm NOS (TWQM1-9IH.K)
3.  Aortic Atherosclerosis (TWQM1-OHF.F).
4. Small pulmonary nodules at the lung bases. Majority of these
pulmonary nodules are stable and compatible with benign nodules.
There is a 3 mm nodule in the left lower lobe which has been stable
since 1814 but not clear if it was present in 0416. No follow-up
needed if patient is low-risk. Non-contrast chest CT can be
considered in 12 months if patient is high-risk. This recommendation
follows the consensus statement: Guidelines for Management of
Incidental Pulmonary Nodules Detected on CT Images: From the

## 2022-09-20 ENCOUNTER — Other Ambulatory Visit: Payer: Self-pay | Admitting: Registered Nurse

## 2022-09-20 DIAGNOSIS — I7121 Aneurysm of the ascending aorta, without rupture: Secondary | ICD-10-CM

## 2022-10-03 ENCOUNTER — Ambulatory Visit
Admission: RE | Admit: 2022-10-03 | Discharge: 2022-10-03 | Disposition: A | Discharge status: Home / Self Care | Payer: Federal, State, Local not specified - PPO | Source: Ambulatory Visit | Attending: Registered Nurse | Admitting: Registered Nurse

## 2022-10-03 DIAGNOSIS — I7121 Aneurysm of the ascending aorta, without rupture: Secondary | ICD-10-CM

## 2022-10-03 DIAGNOSIS — I700 Atherosclerosis of aorta: Secondary | ICD-10-CM | POA: Diagnosis not present

## 2022-10-03 DIAGNOSIS — I712 Thoracic aortic aneurysm, without rupture, unspecified: Secondary | ICD-10-CM | POA: Diagnosis not present

## 2022-10-03 DIAGNOSIS — I2510 Atherosclerotic heart disease of native coronary artery without angina pectoris: Secondary | ICD-10-CM | POA: Diagnosis not present

## 2022-10-03 DIAGNOSIS — K449 Diaphragmatic hernia without obstruction or gangrene: Secondary | ICD-10-CM | POA: Diagnosis not present

## 2022-10-03 MED ORDER — IOPAMIDOL (ISOVUE-370) INJECTION 76%
75.0000 mL | Freq: Once | INTRAVENOUS | Status: AC | PRN
  Administered 2022-10-03: 75 mL via INTRAVENOUS

## 2022-10-24 DIAGNOSIS — E559 Vitamin D deficiency, unspecified: Secondary | ICD-10-CM | POA: Diagnosis not present

## 2022-10-24 DIAGNOSIS — I10 Essential (primary) hypertension: Secondary | ICD-10-CM | POA: Diagnosis not present

## 2022-10-24 DIAGNOSIS — N182 Chronic kidney disease, stage 2 (mild): Secondary | ICD-10-CM | POA: Diagnosis not present

## 2022-10-24 DIAGNOSIS — E1165 Type 2 diabetes mellitus with hyperglycemia: Secondary | ICD-10-CM | POA: Diagnosis not present

## 2022-10-24 DIAGNOSIS — E785 Hyperlipidemia, unspecified: Secondary | ICD-10-CM | POA: Diagnosis not present

## 2022-10-31 DIAGNOSIS — K760 Fatty (change of) liver, not elsewhere classified: Secondary | ICD-10-CM | POA: Diagnosis not present

## 2022-10-31 DIAGNOSIS — I77810 Thoracic aortic ectasia: Secondary | ICD-10-CM | POA: Diagnosis not present

## 2022-10-31 DIAGNOSIS — E785 Hyperlipidemia, unspecified: Secondary | ICD-10-CM | POA: Diagnosis not present

## 2022-10-31 DIAGNOSIS — E1165 Type 2 diabetes mellitus with hyperglycemia: Secondary | ICD-10-CM | POA: Diagnosis not present

## 2022-10-31 DIAGNOSIS — E559 Vitamin D deficiency, unspecified: Secondary | ICD-10-CM | POA: Diagnosis not present

## 2022-10-31 DIAGNOSIS — J309 Allergic rhinitis, unspecified: Secondary | ICD-10-CM | POA: Diagnosis not present

## 2022-10-31 DIAGNOSIS — I2510 Atherosclerotic heart disease of native coronary artery without angina pectoris: Secondary | ICD-10-CM | POA: Diagnosis not present

## 2022-10-31 DIAGNOSIS — I700 Atherosclerosis of aorta: Secondary | ICD-10-CM | POA: Diagnosis not present

## 2022-10-31 DIAGNOSIS — I10 Essential (primary) hypertension: Secondary | ICD-10-CM | POA: Diagnosis not present

## 2022-10-31 DIAGNOSIS — Z0000 Encounter for general adult medical examination without abnormal findings: Secondary | ICD-10-CM | POA: Diagnosis not present

## 2023-04-24 ENCOUNTER — Other Ambulatory Visit: Payer: Self-pay | Admitting: Endocrinology

## 2023-04-24 ENCOUNTER — Other Ambulatory Visit (HOSPITAL_BASED_OUTPATIENT_CLINIC_OR_DEPARTMENT_OTHER): Payer: Self-pay | Admitting: Internal Medicine

## 2023-04-24 DIAGNOSIS — E210 Primary hyperparathyroidism: Secondary | ICD-10-CM

## 2023-04-24 DIAGNOSIS — Z1231 Encounter for screening mammogram for malignant neoplasm of breast: Secondary | ICD-10-CM

## 2023-04-25 ENCOUNTER — Ambulatory Visit (HOSPITAL_BASED_OUTPATIENT_CLINIC_OR_DEPARTMENT_OTHER)
Admission: RE | Admit: 2023-04-25 | Discharge: 2023-04-25 | Disposition: A | Discharge status: Home / Self Care | Payer: Federal, State, Local not specified - PPO | Source: Ambulatory Visit | Attending: Endocrinology | Admitting: Endocrinology

## 2023-04-25 ENCOUNTER — Ambulatory Visit (HOSPITAL_BASED_OUTPATIENT_CLINIC_OR_DEPARTMENT_OTHER): Payer: Federal, State, Local not specified - PPO

## 2023-04-25 ENCOUNTER — Ambulatory Visit (HOSPITAL_BASED_OUTPATIENT_CLINIC_OR_DEPARTMENT_OTHER)
Admission: RE | Admit: 2023-04-25 | Discharge: 2023-04-25 | Disposition: A | Discharge status: Home / Self Care | Payer: Federal, State, Local not specified - PPO | Source: Ambulatory Visit | Attending: Internal Medicine | Admitting: Internal Medicine

## 2023-04-25 DIAGNOSIS — Z1231 Encounter for screening mammogram for malignant neoplasm of breast: Secondary | ICD-10-CM | POA: Insufficient documentation

## 2023-04-25 DIAGNOSIS — Z780 Asymptomatic menopausal state: Secondary | ICD-10-CM | POA: Diagnosis not present

## 2023-04-25 DIAGNOSIS — E210 Primary hyperparathyroidism: Secondary | ICD-10-CM | POA: Insufficient documentation

## 2023-05-01 ENCOUNTER — Other Ambulatory Visit (HOSPITAL_BASED_OUTPATIENT_CLINIC_OR_DEPARTMENT_OTHER): Payer: Federal, State, Local not specified - PPO

## 2023-05-03 DIAGNOSIS — R7309 Other abnormal glucose: Secondary | ICD-10-CM | POA: Diagnosis not present

## 2023-05-03 DIAGNOSIS — G4733 Obstructive sleep apnea (adult) (pediatric): Secondary | ICD-10-CM | POA: Diagnosis not present

## 2023-05-14 DIAGNOSIS — R7309 Other abnormal glucose: Secondary | ICD-10-CM | POA: Diagnosis not present

## 2023-05-14 DIAGNOSIS — E785 Hyperlipidemia, unspecified: Secondary | ICD-10-CM | POA: Diagnosis not present

## 2023-05-14 DIAGNOSIS — Z23 Encounter for immunization: Secondary | ICD-10-CM | POA: Diagnosis not present

## 2023-05-14 DIAGNOSIS — E559 Vitamin D deficiency, unspecified: Secondary | ICD-10-CM | POA: Diagnosis not present

## 2023-05-14 DIAGNOSIS — K219 Gastro-esophageal reflux disease without esophagitis: Secondary | ICD-10-CM | POA: Diagnosis not present

## 2023-05-14 DIAGNOSIS — I129 Hypertensive chronic kidney disease with stage 1 through stage 4 chronic kidney disease, or unspecified chronic kidney disease: Secondary | ICD-10-CM | POA: Diagnosis not present

## 2023-05-14 DIAGNOSIS — I700 Atherosclerosis of aorta: Secondary | ICD-10-CM | POA: Diagnosis not present

## 2023-05-14 DIAGNOSIS — I2510 Atherosclerotic heart disease of native coronary artery without angina pectoris: Secondary | ICD-10-CM | POA: Diagnosis not present

## 2023-05-14 DIAGNOSIS — E892 Postprocedural hypoparathyroidism: Secondary | ICD-10-CM | POA: Diagnosis not present

## 2023-06-02 DIAGNOSIS — G4733 Obstructive sleep apnea (adult) (pediatric): Secondary | ICD-10-CM | POA: Diagnosis not present

## 2023-07-03 DIAGNOSIS — G4733 Obstructive sleep apnea (adult) (pediatric): Secondary | ICD-10-CM | POA: Diagnosis not present

## 2023-07-26 DIAGNOSIS — R7309 Other abnormal glucose: Secondary | ICD-10-CM | POA: Diagnosis not present

## 2023-07-26 DIAGNOSIS — E210 Primary hyperparathyroidism: Secondary | ICD-10-CM | POA: Diagnosis not present

## 2023-07-26 DIAGNOSIS — E559 Vitamin D deficiency, unspecified: Secondary | ICD-10-CM | POA: Diagnosis not present

## 2023-07-26 DIAGNOSIS — E785 Hyperlipidemia, unspecified: Secondary | ICD-10-CM | POA: Diagnosis not present

## 2023-08-02 DIAGNOSIS — E210 Primary hyperparathyroidism: Secondary | ICD-10-CM | POA: Diagnosis not present

## 2023-08-02 DIAGNOSIS — I10 Essential (primary) hypertension: Secondary | ICD-10-CM | POA: Diagnosis not present

## 2023-08-02 DIAGNOSIS — N182 Chronic kidney disease, stage 2 (mild): Secondary | ICD-10-CM | POA: Diagnosis not present

## 2023-08-02 DIAGNOSIS — R7309 Other abnormal glucose: Secondary | ICD-10-CM | POA: Diagnosis not present

## 2023-08-02 DIAGNOSIS — E785 Hyperlipidemia, unspecified: Secondary | ICD-10-CM | POA: Diagnosis not present

## 2023-08-02 DIAGNOSIS — E1165 Type 2 diabetes mellitus with hyperglycemia: Secondary | ICD-10-CM | POA: Diagnosis not present

## 2023-08-02 DIAGNOSIS — K219 Gastro-esophageal reflux disease without esophagitis: Secondary | ICD-10-CM | POA: Diagnosis not present

## 2023-08-02 DIAGNOSIS — E892 Postprocedural hypoparathyroidism: Secondary | ICD-10-CM | POA: Diagnosis not present

## 2023-08-02 DIAGNOSIS — E559 Vitamin D deficiency, unspecified: Secondary | ICD-10-CM | POA: Diagnosis not present

## 2023-08-02 DIAGNOSIS — I129 Hypertensive chronic kidney disease with stage 1 through stage 4 chronic kidney disease, or unspecified chronic kidney disease: Secondary | ICD-10-CM | POA: Diagnosis not present

## 2023-08-10 DIAGNOSIS — R3915 Urgency of urination: Secondary | ICD-10-CM | POA: Diagnosis not present

## 2023-08-10 DIAGNOSIS — Z01411 Encounter for gynecological examination (general) (routine) with abnormal findings: Secondary | ICD-10-CM | POA: Diagnosis not present

## 2024-02-01 ENCOUNTER — Other Ambulatory Visit: Payer: Self-pay

## 2024-02-01 ENCOUNTER — Encounter (HOSPITAL_BASED_OUTPATIENT_CLINIC_OR_DEPARTMENT_OTHER): Payer: Self-pay

## 2024-02-01 ENCOUNTER — Emergency Department (HOSPITAL_BASED_OUTPATIENT_CLINIC_OR_DEPARTMENT_OTHER)

## 2024-02-01 ENCOUNTER — Emergency Department (HOSPITAL_BASED_OUTPATIENT_CLINIC_OR_DEPARTMENT_OTHER)
Admission: EM | Admit: 2024-02-01 | Discharge: 2024-02-01 | Disposition: A | Discharge status: Home / Self Care | Attending: Emergency Medicine | Admitting: Emergency Medicine

## 2024-02-01 DIAGNOSIS — W19XXXA Unspecified fall, initial encounter: Secondary | ICD-10-CM

## 2024-02-01 DIAGNOSIS — M25511 Pain in right shoulder: Secondary | ICD-10-CM | POA: Diagnosis not present

## 2024-02-01 DIAGNOSIS — S0081XA Abrasion of other part of head, initial encounter: Secondary | ICD-10-CM | POA: Insufficient documentation

## 2024-02-01 DIAGNOSIS — W01198A Fall on same level from slipping, tripping and stumbling with subsequent striking against other object, initial encounter: Secondary | ICD-10-CM | POA: Diagnosis not present

## 2024-02-01 DIAGNOSIS — I10 Essential (primary) hypertension: Secondary | ICD-10-CM | POA: Insufficient documentation

## 2024-02-01 DIAGNOSIS — S0990XA Unspecified injury of head, initial encounter: Secondary | ICD-10-CM | POA: Diagnosis present

## 2024-02-01 NOTE — Discharge Instructions (Signed)
 You were seen today for injuries post fall.  Your CT scans today and physical exam today were reassuring that have low suspicion for any emergent cause of symptoms today.-You continue to walk with your cane as well as follow-up with orthopedic surgery for your shoulders if they continue to give you pain.  Please return to the ED if you begin having new or worsening symptoms which would include worsening headache with vision changes, persistent vomiting, chest pain, shortness of breath, one-sided weakness, numbness.

## 2024-02-01 NOTE — ED Notes (Signed)
Patient updated on plan of care

## 2024-02-01 NOTE — ED Provider Notes (Signed)
 [place] EMERGENCY DEPARTMENT AT [facility] Provider Note   CSN: [id number] Arrival date & time: 02/01/24  1607     Patient presents with: Fall   Alexandra Gibson is a 73 y.o. female.    Fall  Patient is a 73 year old female denting to the ED today with concerns for head injury post fall yesterday, noting that she was on her way to the mailbox where she suffered a mechanical fall after tripping and hitting her right forehead on the ground.  Notes that she caught herself with her right shoulder.  Reports mild right shoulder pain however has been dealing with chronic shoulder pain for some time.  Denies LOC, denies blood thinners, vomiting, vision changes, headache.  Has ambulated since the incident.  Previous medical history of HTN, HLD, numbness, arthritis, prior myalgia.   Reports coming to the ED today due to her husband's concerns for possible head injury.  Was not am going to come in but my husband was worried.  Denies chest pain, shortness of breath, abdominal pain, nausea, vomiting, diarrhea, lower leg swelling, numbness, weakness, tingling  Prior to Admission medications   Medication Sig Start Date End Date Taking? Authorizing Provider  Alpha Lipoic Acid 200 MG CAPS 1 capsule    [provider]  amLODipine  (NORVASC ) 5 MG tablet Take 5 mg by mouth daily.    [provider]  celecoxib (CELEBREX) 100 MG capsule Take 200 mg by mouth daily. Patient not taking: Reported on 07/10/2022 02/10/21   [provider]  cetirizine (ZYRTEC) 10 MG tablet Take 10 mg by mouth at bedtime.    [provider]  cyclobenzaprine  (FLEXERIL ) 10 MG tablet Take by mouth. Patient not taking: Reported on 08/09/2022 08/04/22   [provider]  ezetimibe  (ZETIA ) 10 MG tablet TAKE 1 TABLET BY MOUTH EVERY DAY AFTER SUPPER 11/17/21   Knox Perl, MD  Fluticasone  Propionate (FLONASE  NA) Place into the nose. SQUIRT EACH NOSTRILS ONCE A DAY    [provider]  irbesartan  (AVAPRO ) 300 MG tablet Take 300 mg by mouth every morning.    [provider]  meloxicam (MOBIC) 15 MG tablet Take 15 mg by mouth daily.    [provider]  omeprazole  (PRILOSEC) 20 MG capsule Take 20 mg by mouth every morning. 02/10/21   [provider]  omeprazole  (PRILOSEC) 20 MG capsule Take 1 capsule (20 mg total) by mouth daily. 08/09/22 11/07/22  Mansouraty, Albino Alu., MD  pravastatin  (PRAVACHOL ) 40 MG tablet Take 40 mg by mouth every evening.    [provider]  spironolactone  (ALDACTONE ) 25 MG tablet Take 1 tablet (25 mg total) by mouth every morning. 12/15/20 09/16/22  Knox Perl, MD  sucralfate  (CARAFATE ) 1 GM/10ML suspension Take 10 mLs (1 g total) by mouth 2 (two) times daily. 08/09/22   Mansouraty, Albino Alu., MD  tetrahydrozoline-zinc (EYE DROPS ALLERGY RELIEF) 0.05-0.25 % ophthalmic solution Place 2 drops into both eyes daily as needed (allergies).    [provider]    Allergies: Hydrochlorothiazide and Lipitor [atorvastatin]    Review of Systems  Constitutional:        Fall with head injury  All other systems reviewed and are negative.   Updated Vital Signs BP (!) 171/92 (BP Location: Right Arm)   Pulse 72   Temp 97.8 F (36.6 C) (Oral)   Resp 16   SpO2 99%   Physical Exam Vitals and nursing note reviewed.  Constitutional:      General: She  is not in acute distress.    Appearance: Normal appearance. She is not ill-appearing.  HENT:     Head: Normocephalic.     Comments: Light abrasion noted to the right side of her forehead.    Mouth/Throat:     Mouth: Mucous membranes are moist.     Pharynx: Oropharynx is clear. No oropharyngeal exudate or posterior oropharyngeal erythema.   Eyes:     General: No scleral icterus.       Right eye: No discharge.        Left eye: No discharge.     Extraocular Movements: Extraocular movements intact.     Conjunctiva/sclera: Conjunctivae normal.     Pupils:  Pupils are equal, round, and reactive to light.    Cardiovascular:     Rate and Rhythm: Normal rate and regular rhythm.     Pulses: Normal pulses.     Heart sounds: Normal heart sounds. No murmur heard.    No friction rub. No gallop.  Pulmonary:     Effort: Pulmonary effort is normal. No respiratory distress.     Breath sounds: Normal breath sounds. No stridor. No wheezing, rhonchi or rales.  Chest:     Chest wall: No tenderness.  Abdominal:     General: Abdomen is flat. There is no distension.     Palpations: Abdomen is soft.     Tenderness: There is no abdominal tenderness. There is no right CVA tenderness, left CVA tenderness or guarding.   Musculoskeletal:        General: Tenderness (Tenderness noted to right shoulder.) present. No deformity or signs of injury. Normal range of motion.     Cervical back: Normal range of motion and neck supple. No rigidity or tenderness.     Right lower leg: No edema.     Left lower leg: No edema.     Comments: Radial pulses 2+ bilaterally   Skin:    General: Skin is warm and dry.     Capillary Refill: Capillary refill takes less than 2 seconds.     Findings: No bruising, erythema or lesion.   Neurological:     General: No focal deficit present.     Mental Status: She is alert and oriented to person, place, and time. Mental status is at baseline.     Sensory: No sensory deficit.     Motor: No weakness.     Gait: Gait normal.     Comments: No facial asymmetry, no ataxia, no apraxia, no aphasia, no arm drift, normal coordination with finger-to-nose, normal sensation to both upper and lower extremities bilaterally, normal grip strength bilaterally, normal strength to both flexion and extension to both upper lower extremities 5+ bilaterally, no visual field deficits, no nystagmus.   Psychiatric:        Mood and Affect: Mood normal.     (all labs ordered are listed, but only abnormal results are displayed) Labs Reviewed - No data to  display  EKG: None  Radiology: CT Head Wo Contrast Result Date: 02/01/2024 CLINICAL DATA:  Head trauma EXAM: CT HEAD WITHOUT CONTRAST TECHNIQUE: Contiguous axial images were obtained from the base of the skull through the vertex without intravenous contrast. RADIATION DOSE REDUCTION: This exam was performed according to the departmental dose-optimization program which includes automated exposure control, adjustment of the mA and/or kV according to patient size and/or use of iterative reconstruction technique. COMPARISON:  None Available. FINDINGS: Brain: Focal hypodensities within the bilateral basal ganglia consistent with chronic lacunar infarcts.  No acute infarct or hemorrhage. Lateral ventricles and midline structures are unremarkable. No acute extra-axial fluid collections. No mass effect. Vascular: No hyperdense vessel or unexpected calcification. Skull: Normal. Negative for fracture or focal lesion. Sinuses/Orbits: No acute finding. Other: None. IMPRESSION: 1. No acute intracranial process. Electronically Signed   By: Bobbye Burrow M.D.   On: 02/01/2024 20:34   CT Cervical Spine Wo Contrast Result Date: 02/01/2024 CLINICAL DATA:  Neck trauma EXAM: CT CERVICAL SPINE WITHOUT CONTRAST TECHNIQUE: Multidetector CT imaging of the cervical spine was performed without intravenous contrast. Multiplanar CT image reconstructions were also generated. RADIATION DOSE REDUCTION: This exam was performed according to the departmental dose-optimization program which includes automated exposure control, adjustment of the mA and/or kV according to patient size and/or use of iterative reconstruction technique. COMPARISON:  None Available. FINDINGS: Alignment: Slight reversal of cervical lordosis due to multilevel degenerative changes. There is mild degenerative anterolisthesis of C3 on C4, and mild degenerative retrolisthesis of C5 on C6. Skull base and vertebrae: No acute fracture. No primary bone lesion or focal  pathologic process. Soft tissues and spinal canal: No prevertebral fluid or swelling. No visible canal hematoma. Disc levels: Multilevel cervical spondylosis most pronounced at C4-5 and C5-6. There is diffuse facet hypertrophy greatest from C3-4 through C6-7. Upper chest: Airway is patent.  Lung apices are clear. Other: Reconstructed images demonstrate no additional findings. IMPRESSION: 1. No acute cervical spine fracture. 2. Extensive multilevel cervical degenerative changes as above. Electronically Signed   By: Bobbye Burrow M.D.   On: 02/01/2024 20:32    Procedures   Medications Ordered in the ED - No data to display                              Medical Decision Making Amount and/or Complexity of Data Reviewed Radiology: ordered.   This patient is a 73 year old female who presents to the ED for concern of fall with head injury yesterday. Noted to have ambulated, not on thinners, denies LOC.  Came today due to husband concern for the head injury.  On physical exam, patient is in no acute distress, afebrile, alert and orient x 4, speaking in full sentences, nontachypneic, nontachycardic.  Patient had normal neuroexam.  Did note to have a small light abrasion to the right side of her forehead.  No bleeding noted.  No bruising.  Patient has full ROM in both extremities however both shoulders are difficult to raise above 90 degrees, chronic not new.  CT scan of head and neck were unremarkable.  Do not believe x-rays are warranted for shoulders as patient has full ROM at this time.  Will have her continue follow-up with PCP as well as orthopedic surgery as needed for any persistent pain in her shoulders, with her noting that she has no cartilage in either shoulder.  Low suspicion for any other emergent cause of her symptoms today.  Patient vital signs have remained stable throughout the course of patient's time in the ED. Low suspicion for any other emergent pathology at this time. I believe this  patient is safe to be discharged. Provided strict return to ER precautions. Patient expressed agreement and understanding of plan. All questions were answered.  Differential diagnoses prior to evaluation: The emergent differential diagnosis includes, but is not limited to, fracture, ligamentous injury, neurovascular injury, dislocation, malalignment, intracranial bleed. This is not an exhaustive differential.   Past Medical History / Co-morbidities / Social History: HTN,  numbness, urinary frequency, depression, HLD, GERD, anxiety, kidney stones, fibromyalgia, arthritis, parathyroid  tumor  Additional history: Chart reviewed.   Lab Tests/Imaging studies: I personally interpreted labs/imaging and the pertinent results include:    CT head and cervical spine unremarkable.  I agree with the radiologist interpretation.   Medications:  I have reviewed the patients home medicines and have made adjustments as needed.  Critical Interventions: none  Social Determinants of Health:  Patient has good follow-up with PCP and lives at home with husband.  Disposition: After consideration of the diagnostic results and the patients response to treatment, I feel that the patient would benefit from discharge and treatment as above.   emergency department workup does not suggest an emergent condition requiring admission or immediate intervention beyond what has been performed at this time. The plan is: Follow-up with PCP, orthopedic surgery as needed, return to ED for new or worsening symptoms. The patient is safe for discharge and has been instructed to return immediately for worsening symptoms, change in symptoms or any other concerns.     Final diagnoses:  Fall, initial encounter    ED Discharge Orders     None          Vevelyn Gowers 02/01/24 2129    Tegeler, Marine Sia, MD 02/01/24 [phone]

## 2024-02-01 NOTE — ED Triage Notes (Addendum)
 Pt c/o mechanical fall in yard, I was walking from my car into the house, may have tripped over something. C/o R sided localized HA, abrasion to R forehead. Denies NV, vision changes. States recent stressors- husband being released from [facility]- likely a factor in distraction.

## 2024-06-02 ENCOUNTER — Other Ambulatory Visit: Payer: Self-pay | Admitting: Registered Nurse

## 2024-06-02 DIAGNOSIS — Z1231 Encounter for screening mammogram for malignant neoplasm of breast: Secondary | ICD-10-CM

## 2024-07-02 ENCOUNTER — Ambulatory Visit

## 2024-07-29 ENCOUNTER — Ambulatory Visit
Admission: RE | Admit: 2024-07-29 | Discharge: 2024-07-29 | Disposition: A | Source: Ambulatory Visit | Attending: Registered Nurse | Admitting: Registered Nurse

## 2024-07-29 DIAGNOSIS — Z1231 Encounter for screening mammogram for malignant neoplasm of breast: Secondary | ICD-10-CM
